# Patient Record
Sex: Male | Born: 1970 | Race: White | Hispanic: No | Smoking: Current every day smoker
Health system: Southern US, Community
[De-identification: ages and names within clinical notes are randomized; demographics above are authoritative.]

## PROBLEM LIST (undated history)

## (undated) DIAGNOSIS — G629 Polyneuropathy, unspecified: Secondary | ICD-10-CM

## (undated) DIAGNOSIS — J45909 Unspecified asthma, uncomplicated: Secondary | ICD-10-CM

## (undated) HISTORY — PX: APPENDECTOMY: SHX54

## (undated) HISTORY — PX: HERNIA REPAIR: SHX51

---

## 2019-01-03 DIAGNOSIS — M2041 Other hammer toe(s) (acquired), right foot: Secondary | ICD-10-CM | POA: Insufficient documentation

## 2020-07-16 ENCOUNTER — Emergency Department (HOSPITAL_COMMUNITY): Payer: Self-pay

## 2020-07-16 ENCOUNTER — Inpatient Hospital Stay (HOSPITAL_COMMUNITY)
Admission: EM | Admit: 2020-07-16 | Discharge: 2020-07-20 | DRG: 286 | Disposition: A | Discharge status: Home / Self Care | Payer: Self-pay | Attending: Family Medicine | Admitting: Family Medicine

## 2020-07-16 ENCOUNTER — Other Ambulatory Visit: Payer: Self-pay

## 2020-07-16 ENCOUNTER — Inpatient Hospital Stay (HOSPITAL_COMMUNITY): Payer: Self-pay

## 2020-07-16 ENCOUNTER — Encounter (HOSPITAL_COMMUNITY): Payer: Self-pay

## 2020-07-16 DIAGNOSIS — Z880 Allergy status to penicillin: Secondary | ICD-10-CM

## 2020-07-16 DIAGNOSIS — R609 Edema, unspecified: Secondary | ICD-10-CM

## 2020-07-16 DIAGNOSIS — I5082 Biventricular heart failure: Secondary | ICD-10-CM | POA: Diagnosis present

## 2020-07-16 DIAGNOSIS — L03116 Cellulitis of left lower limb: Secondary | ICD-10-CM | POA: Diagnosis present

## 2020-07-16 DIAGNOSIS — D649 Anemia, unspecified: Secondary | ICD-10-CM | POA: Diagnosis present

## 2020-07-16 DIAGNOSIS — I5041 Acute combined systolic (congestive) and diastolic (congestive) heart failure: Secondary | ICD-10-CM | POA: Diagnosis present

## 2020-07-16 DIAGNOSIS — I472 Ventricular tachycardia: Secondary | ICD-10-CM | POA: Diagnosis present

## 2020-07-16 DIAGNOSIS — I272 Pulmonary hypertension, unspecified: Secondary | ICD-10-CM | POA: Diagnosis present

## 2020-07-16 DIAGNOSIS — I420 Dilated cardiomyopathy: Secondary | ICD-10-CM | POA: Diagnosis present

## 2020-07-16 DIAGNOSIS — Z597 Insufficient social insurance and welfare support: Secondary | ICD-10-CM

## 2020-07-16 DIAGNOSIS — I5021 Acute systolic (congestive) heart failure: Secondary | ICD-10-CM

## 2020-07-16 DIAGNOSIS — D869 Sarcoidosis, unspecified: Secondary | ICD-10-CM | POA: Diagnosis present

## 2020-07-16 DIAGNOSIS — F17210 Nicotine dependence, cigarettes, uncomplicated: Secondary | ICD-10-CM | POA: Diagnosis present

## 2020-07-16 DIAGNOSIS — I081 Rheumatic disorders of both mitral and tricuspid valves: Secondary | ICD-10-CM | POA: Diagnosis present

## 2020-07-16 DIAGNOSIS — E854 Organ-limited amyloidosis: Secondary | ICD-10-CM | POA: Diagnosis present

## 2020-07-16 DIAGNOSIS — I8393 Asymptomatic varicose veins of bilateral lower extremities: Secondary | ICD-10-CM | POA: Diagnosis present

## 2020-07-16 DIAGNOSIS — I110 Hypertensive heart disease with heart failure: Principal | ICD-10-CM | POA: Diagnosis present

## 2020-07-16 DIAGNOSIS — I509 Heart failure, unspecified: Secondary | ICD-10-CM

## 2020-07-16 DIAGNOSIS — I2510 Atherosclerotic heart disease of native coronary artery without angina pectoris: Secondary | ICD-10-CM | POA: Diagnosis present

## 2020-07-16 DIAGNOSIS — Z20822 Contact with and (suspected) exposure to covid-19: Secondary | ICD-10-CM | POA: Diagnosis present

## 2020-07-16 DIAGNOSIS — F1510 Other stimulant abuse, uncomplicated: Secondary | ICD-10-CM | POA: Diagnosis present

## 2020-07-16 DIAGNOSIS — G629 Polyneuropathy, unspecified: Secondary | ICD-10-CM | POA: Diagnosis present

## 2020-07-16 DIAGNOSIS — E876 Hypokalemia: Secondary | ICD-10-CM | POA: Diagnosis present

## 2020-07-16 DIAGNOSIS — J45909 Unspecified asthma, uncomplicated: Secondary | ICD-10-CM | POA: Diagnosis present

## 2020-07-16 DIAGNOSIS — Z720 Tobacco use: Secondary | ICD-10-CM | POA: Diagnosis present

## 2020-07-16 HISTORY — DX: Unspecified asthma, uncomplicated: J45.909

## 2020-07-16 HISTORY — DX: Polyneuropathy, unspecified: G62.9

## 2020-07-16 LAB — COMPREHENSIVE METABOLIC PANEL
ALT: 42 U/L (ref 0–44)
AST: 39 U/L (ref 15–41)
Albumin: 3.2 g/dL — ABNORMAL LOW (ref 3.5–5.0)
Alkaline Phosphatase: 67 U/L (ref 38–126)
Anion gap: 8 (ref 5–15)
BUN: 24 mg/dL — ABNORMAL HIGH (ref 6–20)
CO2: 25 mmol/L (ref 22–32)
Calcium: 7.6 mg/dL — ABNORMAL LOW (ref 8.9–10.3)
Chloride: 104 mmol/L (ref 98–111)
Creatinine, Ser: 0.96 mg/dL (ref 0.61–1.24)
GFR, Estimated: >60 mL/min (ref >60)
Glucose, Bld: 93 mg/dL (ref 70–99)
Potassium: 3.3 mmol/L — ABNORMAL LOW (ref 3.5–5.1)
Sodium: 137 mmol/L (ref 135–145)
Total Bilirubin: 0.8 mg/dL (ref 0.3–1.2)
Total Protein: 5.6 g/dL — ABNORMAL LOW (ref 6.5–8.1)

## 2020-07-16 LAB — URINALYSIS, ROUTINE W REFLEX MICROSCOPIC
Bilirubin Urine: NEGATIVE
Glucose, UA: NEGATIVE mg/dL
Hgb urine dipstick: NEGATIVE
Ketones, ur: NEGATIVE mg/dL
Leukocytes,Ua: NEGATIVE
Nitrite: NEGATIVE
Protein, ur: NEGATIVE mg/dL
Specific Gravity, Urine: 1.015 (ref 1.005–1.030)
pH: 5 (ref 5.0–8.0)

## 2020-07-16 LAB — RAPID URINE DRUG SCREEN, HOSP PERFORMED
Amphetamines: POSITIVE — AB
Barbiturates: NOT DETECTED
Benzodiazepines: NOT DETECTED
Cocaine: NOT DETECTED
Opiates: NOT DETECTED
Tetrahydrocannabinol: NOT DETECTED

## 2020-07-16 LAB — CBC
HCT: 36.8 % — ABNORMAL LOW (ref 39.0–52.0)
HCT: 34.5 % — ABNORMAL LOW (ref 39.0–52.0)
Hemoglobin: 12.1 g/dL — ABNORMAL LOW (ref 13.0–17.0)
Hemoglobin: 11.1 g/dL — ABNORMAL LOW (ref 13.0–17.0)
MCH: 31.8 pg (ref 26.0–34.0)
MCH: 31.1 pg (ref 26.0–34.0)
MCHC: 32.9 g/dL (ref 30.0–36.0)
MCHC: 32.2 g/dL (ref 30.0–36.0)
MCV: 96.6 fL (ref 80.0–100.0)
MCV: 96.6 fL (ref 80.0–100.0)
Platelets: 333 10*3/uL (ref 150–400)
Platelets: 286 10*3/uL (ref 150–400)
RBC: 3.81 MIL/uL — ABNORMAL LOW (ref 4.22–5.81)
RBC: 3.57 MIL/uL — ABNORMAL LOW (ref 4.22–5.81)
RDW: 14.6 % (ref 11.5–15.5)
RDW: 14.4 % (ref 11.5–15.5)
WBC: 13.3 10*3/uL — ABNORMAL HIGH (ref 4.0–10.5)
WBC: 10.6 10*3/uL — ABNORMAL HIGH (ref 4.0–10.5)
nRBC: 0 % (ref 0.0–0.2)
nRBC: 0 % (ref 0.0–0.2)

## 2020-07-16 LAB — CREATININE, SERUM
Creatinine, Ser: 1.1 mg/dL (ref 0.61–1.24)
GFR, Estimated: >60 mL/min (ref >60)

## 2020-07-16 LAB — HIV ANTIBODY (ROUTINE TESTING W REFLEX): HIV Screen 4th Generation wRfx: NONREACTIVE

## 2020-07-16 LAB — ECHOCARDIOGRAM COMPLETE
Area-P 1/2: 5.73 cm2
Calc EF: 9.8 %
Height: 70 in
S' Lateral: 6.1 cm
Single Plane A2C EF: 11.6 %
Single Plane A4C EF: 4.7 %
Weight: 3104 oz

## 2020-07-16 LAB — BRAIN NATRIURETIC PEPTIDE: B Natriuretic Peptide: 564 pg/mL — ABNORMAL HIGH (ref 0.0–100.0)

## 2020-07-16 LAB — TROPONIN I (HIGH SENSITIVITY)
Troponin I (High Sensitivity): 36 ng/L — ABNORMAL HIGH (ref <18)
Troponin I (High Sensitivity): 28 ng/L — ABNORMAL HIGH (ref <18)

## 2020-07-16 LAB — RESP PANEL BY RT-PCR (FLU A&B, COVID) ARPGX2
Influenza A by PCR: NEGATIVE
Influenza B by PCR: NEGATIVE
SARS Coronavirus 2 by RT PCR: NEGATIVE

## 2020-07-16 LAB — D-DIMER, QUANTITATIVE: D-Dimer, Quant: 1.25 ug/mL-FEU — ABNORMAL HIGH (ref 0.00–0.50)

## 2020-07-16 MED ORDER — SODIUM CHLORIDE 0.9% FLUSH
3.0000 mL | Freq: Once | INTRAVENOUS | Status: AC
  Administered 2020-07-16: 3 mL via INTRAVENOUS

## 2020-07-16 MED ORDER — SACUBITRIL-VALSARTAN 24-26 MG PO TABS
1.0000 | ORAL_TABLET | Freq: Two times a day (BID) | ORAL | Status: DC
  Administered 2020-07-16 – 2020-07-20 (×8): 1 via ORAL
  Filled 2020-07-16 (×9): qty 1

## 2020-07-16 MED ORDER — IOHEXOL 350 MG/ML SOLN
100.0000 mL | Freq: Once | INTRAVENOUS | Status: AC | PRN
  Administered 2020-07-16: 75 mL via INTRAVENOUS

## 2020-07-16 MED ORDER — ASPIRIN 81 MG PO CHEW: 81.0000 mg | CHEWABLE_TABLET | ORAL | Status: AC

## 2020-07-16 MED ORDER — ENOXAPARIN SODIUM 40 MG/0.4ML IJ SOSY
40.0000 mg | PREFILLED_SYRINGE | INTRAMUSCULAR | Status: DC
  Administered 2020-07-16 – 2020-07-18 (×3): 40 mg via SUBCUTANEOUS
  Filled 2020-07-16 (×3): qty 0.4

## 2020-07-16 MED ORDER — ALBUTEROL SULFATE (2.5 MG/3ML) 0.083% IN NEBU
5.0000 mg | INHALATION_SOLUTION | Freq: Once | RESPIRATORY_TRACT | Status: AC
  Administered 2020-07-16: 5 mg via RESPIRATORY_TRACT
  Filled 2020-07-16: qty 6

## 2020-07-16 MED ORDER — PERFLUTREN LIPID MICROSPHERE
1.0000 mL | INTRAVENOUS | Status: AC | PRN
  Administered 2020-07-16: 2 mL via INTRAVENOUS
  Filled 2020-07-16: qty 10

## 2020-07-16 MED ORDER — ACETAMINOPHEN 325 MG PO TABS
650.0000 mg | ORAL_TABLET | Freq: Four times a day (QID) | ORAL | Status: DC | PRN
  Administered 2020-07-16: 650 mg via ORAL
  Filled 2020-07-16: qty 2

## 2020-07-16 MED ORDER — SODIUM CHLORIDE 0.9% FLUSH
3.0000 mL | Freq: Two times a day (BID) | INTRAVENOUS | Status: DC
  Administered 2020-07-16 – 2020-07-19 (×6): 3 mL via INTRAVENOUS

## 2020-07-16 MED ORDER — DOXYCYCLINE HYCLATE 100 MG PO TABS
100.0000 mg | ORAL_TABLET | Freq: Two times a day (BID) | ORAL | Status: DC
  Administered 2020-07-16 – 2020-07-20 (×9): 100 mg via ORAL
  Filled 2020-07-16 (×9): qty 1

## 2020-07-16 MED ORDER — SODIUM CHLORIDE 0.9 % IV SOLN: 250.0000 mL | INTRAVENOUS | Status: DC | PRN

## 2020-07-16 MED ORDER — OXYCODONE-ACETAMINOPHEN 5-325 MG PO TABS
1.0000 | ORAL_TABLET | Freq: Once | ORAL | Status: AC
  Administered 2020-07-16: 1 via ORAL
  Filled 2020-07-16: qty 1

## 2020-07-16 MED ORDER — POTASSIUM CHLORIDE CRYS ER 20 MEQ PO TBCR
20.0000 meq | EXTENDED_RELEASE_TABLET | Freq: Every day | ORAL | Status: DC
  Administered 2020-07-16: 20 meq via ORAL
  Filled 2020-07-16: qty 1

## 2020-07-16 MED ORDER — SODIUM CHLORIDE 0.9 % IV SOLN: INTRAVENOUS | Status: DC

## 2020-07-16 MED ORDER — FUROSEMIDE 10 MG/ML IJ SOLN
40.0000 mg | Freq: Two times a day (BID) | INTRAMUSCULAR | Status: DC
  Administered 2020-07-16 – 2020-07-17 (×4): 40 mg via INTRAVENOUS
  Filled 2020-07-16 (×4): qty 4

## 2020-07-16 MED ORDER — SODIUM CHLORIDE 0.9% FLUSH: 3.0000 mL | INTRAVENOUS | Status: DC | PRN

## 2020-07-16 MED ORDER — FUROSEMIDE 10 MG/ML IJ SOLN
40.0000 mg | INTRAMUSCULAR | Status: AC
  Administered 2020-07-16: 40 mg via INTRAVENOUS
  Filled 2020-07-16: qty 4

## 2020-07-16 MED ORDER — CLINDAMYCIN PHOSPHATE 600 MG/50ML IV SOLN: 600.0000 mg | Freq: Once | INTRAVENOUS | Status: DC

## 2020-07-16 MED ORDER — METOPROLOL SUCCINATE ER 25 MG PO TB24
25.0000 mg | ORAL_TABLET | Freq: Every day | ORAL | Status: DC
  Administered 2020-07-16: 25 mg via ORAL
  Filled 2020-07-16: qty 1

## 2020-07-16 MED ORDER — POTASSIUM CHLORIDE CRYS ER 20 MEQ PO TBCR
40.0000 meq | EXTENDED_RELEASE_TABLET | Freq: Once | ORAL | Status: AC
  Administered 2020-07-16: 40 meq via ORAL
  Filled 2020-07-16: qty 2

## 2020-07-16 NOTE — ED Triage Notes (Signed)
Patient said his left leg started swelling and then it moved to his right leg. He said his scrotum started swelling too. He feels short of breath on exertion for the past week and it keeps getting worse. Patient has history of asthma, but no cardiac history. Patient smokes 5 cigarettes a day.

## 2020-07-16 NOTE — Progress Notes (Signed)
BLE venous duplex has been completed.  Results can be found under chart review under CV PROC. 07/16/2020 12:35 PM Markela Wee RVT, RDMS

## 2020-07-16 NOTE — Plan of Care (Signed)
Right and left cardiac cath scheduled on Monday 07/19/20, staff RN may release orders accordingly, NPO after midnight before 07/19/20.

## 2020-07-16 NOTE — ED Provider Notes (Signed)
[facility]-EMERGENCY DEPT Provider Note   CSN: [id number] Arrival date & time: 07/16/20  0155     History Chief Complaint  Patient presents with   Leg Swelling   Shortness of Breath    Caleb Landry is a 50 y.o. male.  The history is provided by the patient and medical records.  Shortness of Breath Associated symptoms: wheezing    50 y.o. M with hx of asthma and neuropathy, presenting to the ED with LE edema and SOB.  States swelling started 3 days ago, initially just in the feet and ankles, now progressed all the way up to the legs and into the scrotum.  States he has never had any issues like this before.  States he is always SOB due to his asthma, does seem worse here recently.  Denies chest pain.  No fever/chills.  No sick contacts.  Chronic wheeze from his asthma, has not had any medications in quite some time as he does not have insurance and is waiting for disability resources.  Denies any known cardiac history.  He does smoke daily.  Past Medical History:  Diagnosis Date   Asthma    Neuropathy     There are no problems to display for this patient.   Past Surgical History:  Procedure Laterality Date   APPENDECTOMY     HERNIA REPAIR         History reviewed. No pertinent family history.  Social History   Tobacco Use   Smoking status: Every Day    Packs/day: 0.50    Years: 30.00    Pack years: 15.00    Types: Cigarettes  Substance Use Topics   Alcohol use: Not Currently   Drug use: Not Currently    Home Medications Prior to Admission medications   Not on File    Allergies    Penicillins  Review of Systems   Review of Systems  Respiratory:  Positive for shortness of breath and wheezing.   Cardiovascular:  Positive for leg swelling.  All other systems reviewed and are negative.  Physical Exam Updated Vital Signs BP (!) 126/97 (BP Location: Right Arm)   Pulse (!) 120   Temp 97.8 F (36.6 C) (Oral)   Resp 20   Ht 5\' 10"   (1.778 m)   Wt 88 kg   SpO2 95%   BMI 27.84 kg/m   Physical Exam Vitals and nursing note reviewed.  Constitutional:      Appearance: He is well-developed.  HENT:     Head: Normocephalic and atraumatic.  Eyes:     Conjunctiva/sclera: Conjunctivae normal.     Pupils: Pupils are equal, round, and reactive to light.  Cardiovascular:     Rate and Rhythm: Normal rate and regular rhythm.     Heart sounds: Normal heart sounds.  Pulmonary:     Effort: Pulmonary effort is normal.     Breath sounds: Wheezing present.  Abdominal:     General: Bowel sounds are normal.     Palpations: Abdomen is soft.  Musculoskeletal:        General: Normal range of motion.     Cervical back: Normal range of motion.     Comments: 2+ pitting edema BLE up to level of groin, grossly symmetric Chronic changes of hammertoes bilaterally  Skin:    General: Skin is warm and dry.  Neurological:     Mental Status: He is alert and oriented to person, place, and time.    ED Results /  Procedures / Treatments   Labs (all labs ordered are listed, but only abnormal results are displayed) Labs Reviewed  CBC - Abnormal; Notable for the following components:      Result Value   WBC 13.3 (*)    RBC 3.81 (*)    Hemoglobin 12.1 (*)    HCT 36.8 (*)    All other components within normal limits  BRAIN NATRIURETIC PEPTIDE - Abnormal; Notable for the following components:   B Natriuretic Peptide 564.0 (*)    All other components within normal limits  COMPREHENSIVE METABOLIC PANEL - Abnormal; Notable for the following components:   Potassium 3.3 (*)    BUN 24 (*)    Calcium 7.6 (*)    Total Protein 5.6 (*)    Albumin 3.2 (*)    All other components within normal limits  RAPID URINE DRUG SCREEN, HOSP PERFORMED - Abnormal; Notable for the following components:   Amphetamines POSITIVE (*)    All other components within normal limits  TROPONIN I (HIGH SENSITIVITY) - Abnormal; Notable for the following components:    Troponin I (High Sensitivity) 36 (*)    All other components within normal limits  TROPONIN I (HIGH SENSITIVITY) - Abnormal; Notable for the following components:   Troponin I (High Sensitivity) 28 (*)    All other components within normal limits  RESP PANEL BY RT-PCR (FLU A&B, COVID) [id number]  URINALYSIS, ROUTINE W REFLEX MICROSCOPIC    EKG EKG Interpretation  Date/Time:  Friday July 16 2020 03:41:12 EDT Ventricular Rate:  112 PR Interval:  168 QRS Duration: 95 QT Interval:  343 QTC Calculation: 469 R Axis:   80 Text Interpretation: Sinus tachycardia LVH with secondary repolarization abnormality No previous ECGs available Confirmed by Glynn Octave [phone]) on 07/16/2020 3:46:36 AM  Radiology DG Chest 2 View  Result Date: 07/16/2020 CLINICAL DATA:  50 year old male with shortness of breath. EXAM: CHEST - 2 VIEW COMPARISON:  None FINDINGS: Mild diffuse chronic interstitial coarsening and emphysema. No focal consolidation, pleural effusion, or pneumothorax. Mild cardiomegaly. No acute osseous pathology. IMPRESSION: No acute cardiopulmonary process. Electronically Signed   By: Elgie Collard M.D.   On: 07/16/2020 03:06    Procedures Procedures   Medications Ordered in ED Medications  sodium chloride flush (NS) 0.9 % injection 3 mL (3 mLs Intravenous Given 07/16/20 0303)  albuterol (PROVENTIL) (2.5 MG/3ML) 0.083% nebulizer solution 5 mg (5 mg Nebulization Given 07/16/20 0308)  furosemide (LASIX) injection 40 mg (40 mg Intravenous Given 07/16/20 0420)  oxyCODONE-acetaminophen (PERCOCET/ROXICET) 5-325 MG per tablet 1 tablet (1 tablet Oral Given 07/16/20 0420)    ED Course  I have reviewed the triage vital signs and the nursing notes.  Pertinent labs & imaging results that were available during my care of the patient were reviewed by me and considered in my medical decision making (see chart for details).    MDM Rules/Calculators/A&P  50 year old male presenting to the ED with  lower extremity swelling.  Initially began in feet and ankles is now progressed up to the level of his scrotum.  Also reports of increasing shortness of breath.  Has history of asthma and is a daily smoker but denies any known cardiac history.  He is afebrile and nontoxic in appearance here.  He does have 2+ pitting edema bilateral lower extremities, this is grossly symmetric.  Does have some diffuse wheezing but no overt rails.  Will obtain screening labs, chest x-ray, COVID screen.  Work-up is concerning for new onset CHF.  BNP 564.  Trop elevated but flat at 36 and 28.  Likely component of demand ischemia.  Does have some LVH on EKG but no acute ischemic changes.  UDS + for amphetamines.  Chemistry with mildly low protein but no proteinuria so lower suspicion for acute nephrotic cause.  He is given IV Lasix and has diuresed out about 1 L and reports he is feeling somewhat better.  He will require admission for ongoing work-up, likely echo in AM.  Discussed with Dr. Leafy Half-- will admit for ongoing care.  Final Clinical Impression(s) / ED Diagnoses Final diagnoses:  Acute congestive heart failure, unspecified heart failure type [facility])    Rx / DC Orders ED Discharge Orders     None        Garlon Hatchet, PA-C 07/16/20 0556    Glynn Octave, MD 07/16/20 [phone]

## 2020-07-16 NOTE — H&P (Signed)
History and Physical    Caleb Landry ZLD:[id number] DOB: 1970-11-07 DOA: 07/16/2020  PCP: Pcp, No  Patient coming from: Home.  Chief Complaint: Shortness of breath lower extremity swelling.  HPI: Caleb Landry is a 50 y.o. male with history of asthma, tobacco abuse presents to the ER because of worsening swelling of the lower extremities.  Patient states he noticed the swelling on the left lower extremity over the last 1 month and the right lower extremity started swelling over the last few days which has progressed very quick over the last few days involving his scrotum at this time.  Over the last few days he also has been having shortness of breath exertional and orthopnea.  Denies any chest pain productive cough fever or chills.  Patient states he used to be on albuterol for asthma which he is off at this time since he moved to [place] over a year ago from [place].  ED Course: In the ER on exam patient has significant swelling of the both lower extremity more on the left side than the right and also have some varicose veins.  Swelling extends up to the groin.  JVD is elevated.  Labs reviewed potassium of 3.3 BNP of 564 high sensitive troponins were 36 and 28 WBC count is 13.3 hemoglobin 12.1 chest x-ray unremarkable drug screen is positive for amphetamine though patient denies taking any drugs EKG shows sinus tachycardia with LVH.  Patient was given Lasix 40 mg IV and admitted for possible new onset CHF.  Review of Systems: As per HPI, rest all negative.   Past Medical History:  Diagnosis Date   Asthma    Neuropathy     Past Surgical History:  Procedure Laterality Date   APPENDECTOMY     HERNIA REPAIR       reports that he has been smoking cigarettes. He has a 15.00 pack-year smoking history. He has never used smokeless tobacco. He reports previous alcohol use. He reports previous drug use.  Allergies  Allergen Reactions   Penicillins Hives    Family History  Problem  Relation Age of Onset   Crohn's disease Mother     Prior to Admission medications   Not on File    Physical Exam: Constitutional: Moderately built and nourished. Vitals:   07/16/20 0430 07/16/20 0445 07/16/20 0500 07/16/20 0830  BP: 125/85 (!) 135/98 135/89 119/90  Pulse: (!) 108 (!) 109 (!) 107 (!) 107  Resp: 19 12 15 16   Temp:      TempSrc:      SpO2: 100% (!) 87% 100% 98%  Weight:      Height:       Eyes: Anicteric no pallor. ENMT: No discharge from the ears eyes nose and mouth. Neck: No mass felt.  No neck rigidity. Respiratory: No rhonchi or crepitations. Cardiovascular: S1-S2 heard. Abdomen: Soft nontender bowel sounds present. Musculoskeletal: Bilateral lower extremity swelling more on the left side than the right with some warmth of the left lower extremity. Skin: Mild erythema of the left lower extremity. Neurologic: Alert awake oriented to time place and person.  Moves all extremities. Psychiatric: Appears normal.  Normal affect.   Labs on Admission: I have personally reviewed following labs and imaging studies  CBC: Recent Labs  Lab 07/16/20 0213  WBC 13.3*  HGB 12.1*  HCT 36.8*  MCV 96.6  PLT 333   Basic Metabolic Panel: Recent Labs  Lab 07/16/20 0412  NA 137  K 3.3*  CL 104  CO2  25  GLUCOSE 93  BUN 24*  CREATININE 0.96  CALCIUM 7.6*   GFR: Estimated Creatinine Clearance: 104 mL/min (by C-G formula based on SCr of 0.96 mg/dL). Liver Function Tests: Recent Labs  Lab 07/16/20 0412  AST 39  ALT 42  ALKPHOS 67  BILITOT 0.8  PROT 5.6*  ALBUMIN 3.2*   No results for input(s): LIPASE, AMYLASE in the last 168 hours. No results for input(s): AMMONIA in the last 168 hours. Coagulation Profile: No results for input(s): INR, PROTIME in the last 168 hours. Cardiac Enzymes: No results for input(s): CKTOTAL, CKMB, CKMBINDEX, TROPONINI in the last 168 hours. BNP (last 3 results) No results for input(s): PROBNP in the last 8760  hours. HbA1C: No results for input(s): HGBA1C in the last 72 hours. CBG: No results for input(s): GLUCAP in the last 168 hours. Lipid Profile: No results for input(s): CHOL, HDL, LDLCALC, TRIG, CHOLHDL, LDLDIRECT in the last 72 hours. Thyroid Function Tests: No results for input(s): TSH, T4TOTAL, FREET4, T3FREE, THYROIDAB in the last 72 hours. Anemia Panel: No results for input(s): VITAMINB12, FOLATE, FERRITIN, TIBC, IRON, RETICCTPCT in the last 72 hours. Urine analysis:    Component Value Date/Time   COLORURINE YELLOW 07/16/2020 0450   APPEARANCEUR CLEAR 07/16/2020 0450   LABSPEC 1.015 07/16/2020 0450   PHURINE 5.0 07/16/2020 0450   GLUCOSEU NEGATIVE 07/16/2020 0450   HGBUR NEGATIVE 07/16/2020 0450   BILIRUBINUR NEGATIVE 07/16/2020 0450   KETONESUR NEGATIVE 07/16/2020 0450   PROTEINUR NEGATIVE 07/16/2020 0450   NITRITE NEGATIVE 07/16/2020 0450   LEUKOCYTESUR NEGATIVE 07/16/2020 0450   Sepsis Labs: @LABRCNTIP (procalcitonin:4,lacticidven:4) ) Recent Results (from the past 240 hour(s))  Resp Panel by RT-PCR (Flu A&B, Covid) Nasopharyngeal Swab     Status: None   Collection Time: 07/16/20  2:23 AM   Specimen: Nasopharyngeal Swab; Nasopharyngeal(NP) swabs in vial transport medium  Result Value Ref Range Status   SARS Coronavirus 2 by RT PCR NEGATIVE NEGATIVE Final    Comment: (NOTE) SARS-CoV-2 target nucleic acids are NOT DETECTED.  The SARS-CoV-2 RNA is generally detectable in upper respiratory specimens during the acute phase of infection. The lowest concentration of SARS-CoV-2 viral copies this assay can detect is 138 copies/mL. A negative result does not preclude SARS-Cov-2 infection and should not be used as the sole basis for treatment or other patient management decisions. A negative result may occur with  improper specimen collection/handling, submission of specimen other than nasopharyngeal swab, presence of viral mutation(s) within the areas targeted by this assay,  and inadequate number of viral copies(<138 copies/mL). A negative result must be combined with clinical observations, patient history, and epidemiological information. The expected result is Negative.  Fact Sheet for Patients:  07/18/20  Fact Sheet for Healthcare Providers:  BloggerCourse.com  This test is no t yet approved or cleared by the [place] FDA and  has been authorized for detection and/or diagnosis of SARS-CoV-2 by FDA under an Emergency Use Authorization (EUA). This EUA will remain  in effect (meaning this test can be used) for the duration of the COVID-19 declaration under Section 564(b)(1) of the Act, 21 U.S.C.section 360bbb-3(b)(1), unless the authorization is terminated  or revoked sooner.       Influenza A by PCR NEGATIVE NEGATIVE Final   Influenza B by PCR NEGATIVE NEGATIVE Final    Comment: (NOTE) The Xpert Xpress SARS-CoV-2/FLU/RSV plus assay is intended as an aid in the diagnosis of influenza from Nasopharyngeal swab specimens and should not be used as a sole basis  for treatment. Nasal washings and aspirates are unacceptable for Xpert Xpress SARS-CoV-2/FLU/RSV testing.  Fact Sheet for Patients: BloggerCourse.com  Fact Sheet for Healthcare Providers: SeriousBroker.it  This test is not yet approved or cleared by the [place] FDA and has been authorized for detection and/or diagnosis of SARS-CoV-2 by FDA under an Emergency Use Authorization (EUA). This EUA will remain in effect (meaning this test can be used) for the duration of the COVID-19 declaration under Section 564(b)(1) of the Act, 21 U.S.C. section 360bbb-3(b)(1), unless the authorization is terminated or revoked.  Performed at [facility], [address] [place], [place] [address]      Radiological Exams on Admission: DG Chest 2 View  Result Date:  07/16/2020 CLINICAL DATA:  50 year old male with shortness of breath. EXAM: CHEST - 2 VIEW COMPARISON:  None FINDINGS: Mild diffuse chronic interstitial coarsening and emphysema. No focal consolidation, pleural effusion, or pneumothorax. Mild cardiomegaly. No acute osseous pathology. IMPRESSION: No acute cardiopulmonary process. Electronically Signed   By: Elgie Collard M.D.   On: 07/16/2020 03:06    EKG: Independently reviewed.  Sinus tachycardia with LVH.  Assessment/Plan Principal Problem:   Acute congestive heart failure (HCC) Active Problems:   Tobacco abuse   Asthma   Acute CHF (congestive heart failure) (HCC)    Shortness of breath likely from new onset acute CHF with unknown EF -given the markedly significant lower extreme edema with exertional symptoms and orthopnea symptoms are more consistent with CHF.  Has been placed on Lasix 40 mg IV every 12 with potassium replacement we will check 2D echo closely follow intake and output and metabolic panel.  However since patient does have significant swelling more on the left than the right we will check Dopplers to rule out DVT since there is also warmth on the left side.  If D-dimer is positive we will be ordering a CT angiogram of the chest to rule out PE. Left lower extremity swelling more than the right with some warmth at Dopplers have been ordered to rule out DVT.  He has some erythema on the leg for which I have ordered doxycycline for now to make sure there is no cellulitis developing. Tobacco abuse advised about quitting. Normocytic normochromic anemia will anemia panel with next blood draw stool for occult blood follow CBC. History of asthma presently not wheezing.  Since patient has signs and symptoms of possible new onset CHF will need close monitoring and further work-up and inpatient status.   DVT prophylaxis: Lovenox. Code Status: Full code. Family Communication: Discussed with patient. Disposition Plan: Home. Consults  called: We will consult cardiology. Admission status: Inpatient.   Eduard Clos MD [facility] Pager [phone].  If 7PM-7AM, please contact night-coverage [email or web address] Password [email or web address]  07/16/2020, 9:02 AM

## 2020-07-16 NOTE — ED Notes (Signed)
Hospitalist at bedside 

## 2020-07-16 NOTE — Plan of Care (Signed)

## 2020-07-16 NOTE — ED Notes (Signed)
[facility] at bedside 

## 2020-07-16 NOTE — Progress Notes (Signed)
  Echocardiogram 2D Echocardiogram has been performed. Cardiology notified.   Janalyn Harder 07/16/2020, 10:34 AM

## 2020-07-16 NOTE — Consult Note (Addendum)
Cardiology Consultation:   Patient ID: Caleb Landry MRN: [id number]; DOB: [birth year]  Admit date: 07/16/2020 Date of Consult: 07/16/2020  PCP:  Oneita Hurt No   [facility] Providers Cardiologist: New to Dr. Antoine Poche   Patient Profile:   Caleb Landry is a 50 y.o. male with PMH of asthma, tobacco abuse, sarcoidosis,  who is being seen 07/16/2020 for the evaluation of CHF at the request of Dr. Toniann Fail.  History of Present Illness:   Mr. Monteverde has no known cardiac disease in the past.   Patient presented to the ER on 07/16/2020 with complaining of lower extremity and shortness of breath.  He noticed swelling started on his feet and ankles, gradually progressed to the level of his scrotum.  He reports shortness of breath with exertional activity.  He endorses orthopnea.  Symptoms started 1 month ago and is much worsened over the past few days.  Therefore he came to the ER for evaluation. He also c/o of LLE erythema and tenderness. He has urinated a lot after getting Lasix and is feeling better with SOB. He denied any chest pain or pressure, dizziness, syncope, heart palpitation.   He is a daily tobacco smoker with average 5 cigarettes daily, he had hx of asthma, but due to no insurance, he is not using any bronchodilators. He states he purchases OTC inhalers. He states he was diagnosed with sarcoidosis with biopsy when he was 21 years ago, not following up on this or getting any treatment due to no insurance. He drinks ETOH occasionally with 1 -2 beers. He denied any illicit drug use. He is unemployed, lost his wife 2 years ago, is trying to get disability. He does not recall significant family medical history, recalls his mother was gaining a lot of weight.   Diagnostic work-up here revealed hypokalemia 3.3, elevated BUN 24, albumin 3.2 on CMP.  High sensitive troponin 36>28.  CBC revealed normocytic anemia with hemoglobin 11.1, otherwise unremarkable.  BNP 564. D-dimer elevated 1.25.  HIV negative.   Urinalysis unremarkable.  UDS positive for amphetamine.  Chest x-ray did not reveal acute finding.  CTA of chest showed no acute pulmonary emboli, biapical and bibasilar scarring, nodular density in the upper lobes/apices bilaterally. Doppler of BLE negative for DVTs. EKG revealed sinus tachycardia 112 bpm, LVH, TWI noted of inferolateral leads, no prior EKG to compare. He is tachycardiac with HR 100-120s, otherwise HD stable at ED. He was admitted to hospital medicine, started on IV Lasix  BID.   Echo was completed today, showed EF <20%, LV global hypokinesis, LV moderately dilated, mild concentric LVH, grade III DD (restrictive), elevated LA pressure, no LV thrombus, RV systolic moderately reduced, RV moderately reduced, moderate elevated PASP with RVSP 54.7 mmHg, severely dilated LA, moderately dilated RA, small pericardial effusion, mild to moderate MR, mild to moderate TR, mild dilatation of the ascending aorta measuring 41 mm.   Cardiology consult is requested for further input.     Past Medical History:  Diagnosis Date   Asthma    Neuropathy     Past Surgical History:  Procedure Laterality Date   APPENDECTOMY     HERNIA REPAIR       Home Medications:  Prior to Admission medications   Not on File    Inpatient Medications: Scheduled Meds:  doxycycline  100 mg Oral Q12H   enoxaparin (LOVENOX) injection  40 mg Subcutaneous Q24H   furosemide  40 mg Intravenous Q12H   metoprolol succinate  25 mg Oral Daily  potassium chloride  20 mEq Oral Daily   sacubitril-valsartan  1 tablet Oral BID   Continuous Infusions:  PRN Meds: REM  Allergies:    Allergies  Allergen Reactions   Penicillins Hives    Social History:   Social History   Socioeconomic History   Marital status: Widowed    Spouse name: Not on file   Number of children: Not on file   Years of education: Not on file   Highest education level: Not on file  Occupational History   Not on file  Tobacco Use    Smoking status: Every Day    Packs/day: 0.50    Years: 30.00    Pack years: 15.00    Types: Cigarettes   Smokeless tobacco: Never  Substance and Sexual Activity   Alcohol use: Not Currently   Drug use: Not Currently   Sexual activity: Not on file  Other Topics Concern   Not on file  Social History Narrative   Not on file   Social Determinants of Health   Financial Resource Strain: Not on file  Food Insecurity: Not on file  Transportation Needs: Not on file  Physical Activity: Not on file  Stress: Not on file  Social Connections: Not on file  Intimate Partner Violence: Not on file    Family History:    Family History  Problem Relation Age of Onset   Crohn's disease Mother      ROS:  Vitals:  Vitals:   07/16/20 1216 07/16/20 1300  BP: 103/72 117/72  Pulse: (!) 117 (!) 121  Resp: 18 18  Temp:    SpO2: 100% 98%   Constitutional: Denied fever, chills, malaise, night sweats Eyes: Denied vision change or loss Ears/Nose/Mouth/Throat: Denied ear ache, sore throat, coughing, sinus pain Cardiovascular:see HPI Respiratory: see HPI  Gastrointestinal: Denied nausea, vomiting, abdominal pain, diarrhea Genital/Urinary: Denied dysuria, hematuria, urinary frequency/urgency Musculoskeletal: Denied muscle ache, joint pain, weakness Skin: Denied rash, wound Neuro: Denied headache, dizziness, syncope Psych: Denied history of depression/anxiety  Endocrine: Denied history of diabetes    Physical Exam/Data:   Vitals:   07/16/20 1045 07/16/20 1100 07/16/20 1216 07/16/20 1300  BP: 117/79 108/75 103/72 117/72  Pulse: (!) 106 (!) 105 (!) 117 (!) 121  Resp:   18 18  Temp:      TempSrc:      SpO2: 100% 96% 100% 98%  Weight:      Height:        Intake/Output Summary (Last 24 hours) at 07/16/2020 1539 Last data filed at 07/16/2020 1500 Gross per 24 hour  Intake 240 ml  Output 4100 ml  Net -3860 ml   Last 3 Weights 07/16/2020  Weight (lbs) 194 lb  Weight (kg) 87.998 kg      Body mass index is 27.84 kg/m.   Vitals:  Vitals:   07/16/20 1216 07/16/20 1300  BP: 103/72 117/72  Pulse: (!) 117 (!) 121  Resp: 18 18  Temp:    SpO2: 100% 98%   General Appearance: In no apparent distress, sitting in bed, cachexia  HEENT: Normocephalic, atraumatic.  Neck: Supple, trachea midline, JVD 8-9 CM Cardiovascular: Regular rate and rhythm, normal S1-S2,  no murmur/rub/gallop Respiratory: Resting breathing unlabored, lungs sounds with diffuse wheezing bilaterally, no use of accessory muscles. On room air.  Speaks full sentence  Gastrointestinal: Bowel sounds positive, abdomen soft, non-tender, non-distended.  Extremities: BLE 2+ pitting edema from feet to scrotum area Genitourinary: Urine clear yellow  Musculoskeletal: Normal muscle bulk and  tone, muscle strength 5/5 throughout Skin: Intact, warm, dry. LLE with erythema/warmth/tenderness without open wound or ulcer or drainage  Neurologic: Alert, oriented to person, place and time. Fluent speech,  no cognitive deficit, no gross focal neuro deficit Psychiatric: Normal affect. Mood is appropriate.    EKG:  The EKG was personally reviewed and demonstrates:  Sinus tachycardia , LVH, TWI of inferolateral leads, no previous EKG to compare  Telemetry:  Telemetry was personally reviewed and demonstrates:  Not connected at this time   Relevant CV Studies:  Echo from 07/16/20:   1. Left ventricular ejection fraction, by estimation, is <20%. The left  ventricle has severely decreased function. The left ventricle demonstrates  global hypokinesis. The left ventricular internal cavity size was  moderately dilated. There is mild  concentric left ventricular hypertrophy. Left ventricular diastolic  parameters are consistent with Grade III diastolic dysfunction  (restrictive). Elevated left atrial pressure. Non Compaction Fraction <  2.3 my [name]. No LV thrombus.   2. Right ventricular systolic function is  moderately reduced. The right  ventricular size is moderately enlarged. There is moderately elevated  pulmonary artery systolic pressure. The estimated right ventricular  systolic pressure is 54.7 mmHg.   3. Left atrial size was severely dilated.   4. Right atrial size was moderately dilated.   5. A small pericardial effusion is present.   6. The mitral valve is grossly normal. Mild to moderate mitral valve  regurgitation- mechanism appears to be ventricular functional. No evidence  of mitral stenosis.   7. Tricuspid valve regurgitation is mild to moderate- mechanisms appears  to be ventricular functional.   8. The aortic valve was not well visualized. Aortic valve regurgitation  is not visualized. No aortic stenosis is present.   9. Aortic dilatation noted. There is mild dilatation of the ascending  aorta, measuring 41 mm.  10. The inferior vena cava is dilated in size with <50% respiratory  variability, suggesting right atrial pressure of 15 mmHg.   Comparison(s): No prior Echocardiogram.   Laboratory Data:  High Sensitivity Troponin:   Recent Labs  Lab 07/16/20 0222 07/16/20 0412  TROPONINIHS 36* 28*     Chemistry Recent Labs  Lab 07/16/20 0412 07/16/20 0926  NA 137  --   K 3.3*  --   CL 104  --   CO2 25  --   GLUCOSE 93  --   BUN 24*  --   CREATININE 0.96 1.10  CALCIUM 7.6*  --   GFRNONAA >60 >60  ANIONGAP 8  --     Recent Labs  Lab 07/16/20 0412  PROT 5.6*  ALBUMIN 3.2*  AST 39  ALT 42  ALKPHOS 67  BILITOT 0.8   Hematology Recent Labs  Lab 07/16/20 0213 07/16/20 0926  WBC 13.3* 10.6*  RBC 3.81* 3.57*  HGB 12.1* 11.1*  HCT 36.8* 34.5*  MCV 96.6 96.6  MCH 31.8 31.1  MCHC 32.9 32.2  RDW 14.6 14.4  PLT 333 286   BNP Recent Labs  Lab 07/16/20 0223  BNP 564.0*    DDimer  Recent Labs  Lab 07/16/20 0813  DDIMER 1.25*     Radiology/Studies:  DG Chest 2 View  Result Date: 07/16/2020 CLINICAL DATA:  50 year old male with shortness of  breath. EXAM: CHEST - 2 VIEW COMPARISON:  None FINDINGS: Mild diffuse chronic interstitial coarsening and emphysema. No focal consolidation, pleural effusion, or pneumothorax. Mild cardiomegaly. No acute osseous pathology. IMPRESSION: No acute cardiopulmonary process. Electronically Signed  By: Elgie Collard M.D.   On: 07/16/2020 03:06   CT Angio Chest Pulmonary Embolism (PE) W or WO Contrast  Result Date: 07/16/2020 CLINICAL DATA:  Leg swelling.  Shortness of breath. EXAM: CT ANGIOGRAPHY CHEST WITH CONTRAST TECHNIQUE: Multidetector CT imaging of the chest was performed using the standard protocol during bolus administration of intravenous contrast. Multiplanar CT image reconstructions and MIPs were obtained to evaluate the vascular anatomy. CONTRAST:  75mL OMNIPAQUE IOHEXOL 350 MG/ML SOLN COMPARISON:  None. FINDINGS: Cardiovascular: No filling defects in the pulmonary arteries to suggest pulmonary emboli. Heart is mildly enlarged. Aorta normal caliber. Mediastinum/Nodes: No mediastinal, hilar, or axillary adenopathy. Trachea and esophagus are unremarkable. Thyroid unremarkable. Lungs/Pleura: Biapical pulmonary nodules measuring up to 10 mm in the left apex and 9 mm in the right apex. Areas of apical and bibasilar scarring. Trace right pleural effusion. Upper Abdomen: Imaging into the upper abdomen demonstrates no acute findings. Musculoskeletal: Chest wall soft tissues are unremarkable. No acute bony abnormality. Review of the MIP images confirms the above findings. IMPRESSION: No evidence of pulmonary embolus. Cardiomegaly. Biapical and bibasilar scarring. Nodular densities in the upper lobes/apices bilaterally warrant follow-up. Non-contrast chest CT at 3-6 months is recommended. If the nodules are stable at time of repeat CT, then future CT at 18-24 months (from today's scan) is considered optional for low-risk patients, but is recommended for high-risk patients. This recommendation follows the consensus  statement: Guidelines for Management of Incidental Pulmonary Nodules Detected on CT Images: From the Fleischner Society 2017; Radiology 2017; 284:228-243. Trace right pleural effusion. Electronically Signed   By: Charlett Nose M.D.   On: 07/16/2020 11:38   ECHOCARDIOGRAM COMPLETE  Result Date: 07/16/2020    ECHOCARDIOGRAM REPORT   Patient Name:   Sandra Hottenstein Date of Exam: 07/16/2020 Medical Rec #:  [id number]    Height:       70.0 in Accession #:    [id number]   Weight:       194.0 lb Date of Birth:  1970-09-21    BSA:          2.060 m Patient Age:    50 years     BP:           119/90 mmHg Patient Gender: M            HR:           105 bpm. Exam Location:  Inpatient Procedure: 2D Echo, 3D Echo, Cardiac Doppler and Color Doppler REPORT CONTAINS CRITICAL RESULT                                 MODIFIED REPORT:     This report was modified by Riley Lam MD on 07/16/2020 due to                                Additional images.  Indications:     I50.40* Unspecified combined systolic (congestive) and                  diastolic (congestive) heart failure  History:         Patient has no prior history of Echocardiogram examinations.                  CHF, Signs/Symptoms:Shortness of Breath and Dyspnea; Risk  Factors:Current Smoker.  Sonographer:     Sheralyn Boatmanina West RDCS Referring Phys:  [id number] Meryle ReadyARSHAD N [facility] LLCKAKRAKANDY Diagnosing Phys: Riley LamMahesh Chandrasekhar MD  Sonographer Comments: Suboptimal parasternal window and suboptimal subcostal window. Patient in high fowler's position due to dyspnea. IMPRESSIONS  1. Left ventricular ejection fraction, by estimation, is <20%. The left ventricle has severely decreased function. The left ventricle demonstrates global hypokinesis. The left ventricular internal cavity size was moderately dilated. There is mild concentric left ventricular hypertrophy. Left ventricular diastolic parameters are consistent with Grade III diastolic dysfunction (restrictive). Elevated left atrial  pressure. Non Compaction Fraction < 2.3 my [name]. No LV thrombus.  2. Right ventricular systolic function is moderately reduced. The right ventricular size is moderately enlarged. There is moderately elevated pulmonary artery systolic pressure. The estimated right ventricular systolic pressure is 54.7 mmHg.  3. Left atrial size was severely dilated.  4. Right atrial size was moderately dilated.  5. A small pericardial effusion is present.  6. The mitral valve is grossly normal. Mild to moderate mitral valve regurgitation- mechanism appears to be ventricular functional. No evidence of mitral stenosis.  7. Tricuspid valve regurgitation is mild to moderate- mechanisms appears to be ventricular functional.  8. The aortic valve was not well visualized. Aortic valve regurgitation is not visualized. No aortic stenosis is present.  9. Aortic dilatation noted. There is mild dilatation of the ascending aorta, measuring 41 mm. 10. The inferior vena cava is dilated in size with <50% respiratory variability, suggesting right atrial pressure of 15 mmHg. Comparison(s): No prior Echocardiogram. Conclusion(s)/Recommendation(s): New Biventricular dysfunction. Discussed with primary MD. Constrasted images do not meet criteria for LV non-compaction or show LV thrombus. CMR could be considered in the future if clinically indicated. FINDINGS  Left Ventricle: Left ventricular ejection fraction, by estimation, is <20%. The left ventricle has severely decreased function. The left ventricle demonstrates global hypokinesis. The left ventricular internal cavity size was moderately dilated. There is mild concentric left ventricular hypertrophy. Left ventricular diastolic parameters are consistent with Grade III diastolic dysfunction (restrictive). Elevated left atrial pressure. Right Ventricle: The right ventricular size is moderately enlarged. Right vetricular wall thickness was not well visualized. Right ventricular systolic  function is moderately reduced. There is moderately elevated pulmonary artery systolic pressure. The tricuspid regurgitant velocity is 3.15 m/s, and with an assumed right atrial pressure of 15 mmHg, the estimated right ventricular systolic pressure is 54.7 mmHg. Left Atrium: Left atrial size was severely dilated. Right Atrium: Right atrial size was moderately dilated. Pericardium: A small pericardial effusion is present. Mitral Valve: The mitral valve is grossly normal. Mild to moderate mitral valve regurgitation. No evidence of mitral valve stenosis. Tricuspid Valve: The tricuspid valve is grossly normal. Tricuspid valve regurgitation is mild to moderate. No evidence of tricuspid stenosis. Aortic Valve: The aortic valve was not well visualized. Aortic valve regurgitation is not visualized. No aortic stenosis is present. Pulmonic Valve: The pulmonic valve was not well visualized. Pulmonic valve regurgitation is not visualized. No evidence of pulmonic stenosis. Aorta: The aortic root is normal in size and structure and aortic dilatation noted. There is mild dilatation of the ascending aorta, measuring 41 mm. Venous: The inferior vena cava is dilated in size with less than 50% respiratory variability, suggesting right atrial pressure of 15 mmHg. IAS/Shunts: The atrial septum is grossly normal.  LEFT VENTRICLE PLAX 2D LVIDd:         6.60 cm      Diastology LVIDs:  6.10 cm      LV e' medial:    6.09 cm/s LV PW:         1.40 cm      LV E/e' medial:  21.3 LV IVS:        1.20 cm      LV e' lateral:   7.29 cm/s LVOT diam:     2.30 cm      LV E/e' lateral: 17.8 LV SV:         41 LV SV Index:   20 LVOT Area:     4.15 cm  LV Volumes (MOD) LV vol d, MOD A2C: 293.0 ml LV vol d, MOD A4C: 211.0 ml LV vol s, MOD A2C: 259.0 ml LV vol s, MOD A4C: 201.0 ml LV SV MOD A2C:     34.0 ml LV SV MOD A4C:     211.0 ml LV SV MOD BP:      25.1 ml RIGHT VENTRICLE RV S prime:     5.87 cm/s TAPSE (M-mode): 1.1 cm LEFT ATRIUM               Index       RIGHT ATRIUM           Index LA diam:        2.80 cm  1.36 cm/m  RA Area:     23.70 cm LA Vol (A2C):   92.9 ml  45.09 ml/m RA Volume:   89.80 ml  43.58 ml/m LA Vol (A4C):   117.0 ml 56.78 ml/m LA Biplane Vol: 107.0 ml 51.93 ml/m  AORTIC VALVE LVOT Vmax:   78.80 cm/s LVOT Vmean:  50.900 cm/s LVOT VTI:    0.099 m  AORTA Ao Root diam: 3.90 cm Ao Asc diam:  4.10 cm MITRAL VALVE                TRICUSPID VALVE MV Area (PHT): 5.73 cm     TR Peak grad:   39.7 mmHg MV Decel Time: 133 msec     TR Vmax:        315.00 cm/s MV E velocity: 129.50 cm/s                             SHUNTS                             Systemic VTI:  0.10 m                             Systemic Diam: 2.30 cm Riley Lam MD Electronically signed by Riley Lam MD Signature Date/Time: 07/16/2020/10:59:36 AM    Final (Updated)    VAS [place] LOWER EXTREMITY VENOUS (DVT)  Result Date: 07/16/2020  Lower Venous DVT Study Patient Name:  SUSANO CLECKLER  Date of Exam:   07/16/2020 Medical Rec #: [id number]     Accession #:    [id number] Date of Birth: 12-28-1970     Patient Gender: M Patient Age:   50Y Exam Location:  [facility] Procedure:      VAS [place] LOWER EXTREMITY VENOUS (DVT) Referring Phys: [id number] Eduard Clos --------------------------------------------------------------------------------  Indications: Edema.  Limitations: Poor ultrasound/tissue interface. Comparison Study: No previous exams Performing Technologist: Jody Hill RVT, RDMS  Examination Guidelines: A complete evaluation includes B-mode imaging, spectral Doppler, color Doppler, and power Doppler  as needed of all accessible portions of each vessel. Bilateral testing is considered an integral part of a complete examination. Limited examinations for reoccurring indications may be performed as noted. The reflux portion of the exam is performed with the patient in reverse Trendelenburg.   +---------+---------------+---------+-----------+----------+--------------+ RIGHT    CompressibilityPhasicitySpontaneityPropertiesThrombus Aging +---------+---------------+---------+-----------+----------+--------------+ CFV      Full           Yes      Yes                                 +---------+---------------+---------+-----------+----------+--------------+ SFJ      Full                                                        +---------+---------------+---------+-----------+----------+--------------+ FV Prox  Full           Yes      Yes                                 +---------+---------------+---------+-----------+----------+--------------+ FV Mid   Full           Yes      Yes                                 +---------+---------------+---------+-----------+----------+--------------+ FV DistalFull           Yes      Yes                                 +---------+---------------+---------+-----------+----------+--------------+ PFV      Full                                                        +---------+---------------+---------+-----------+----------+--------------+ POP      Full           Yes      Yes                                 +---------+---------------+---------+-----------+----------+--------------+ PTV      Full                                                        +---------+---------------+---------+-----------+----------+--------------+ PERO     Full                                                        +---------+---------------+---------+-----------+----------+--------------+   +---------+---------------+---------+-----------+----------+--------------+ LEFT     CompressibilityPhasicitySpontaneityPropertiesThrombus Aging +---------+---------------+---------+-----------+----------+--------------+ CFV      Full  Yes      Yes                                  +---------+---------------+---------+-----------+----------+--------------+ SFJ      Full                                                        +---------+---------------+---------+-----------+----------+--------------+ FV Prox  Full           Yes      Yes                                 +---------+---------------+---------+-----------+----------+--------------+ FV Mid   Full           Yes      Yes                                 +---------+---------------+---------+-----------+----------+--------------+ FV DistalFull           Yes      Yes                                 +---------+---------------+---------+-----------+----------+--------------+ PFV      Full                                                        +---------+---------------+---------+-----------+----------+--------------+ POP      Full           Yes      Yes                                 +---------+---------------+---------+-----------+----------+--------------+ PTV      Full                                                        +---------+---------------+---------+-----------+----------+--------------+ PERO     Full                                                        +---------+---------------+---------+-----------+----------+--------------+     Summary: BILATERAL: - No evidence of deep vein thrombosis seen in the lower extremities, bilaterally. - No evidence of superficial venous thrombosis in the lower extremities, bilaterally. -No evidence of popliteal cyst, bilaterally. -Subcutaneous edema of BLE.  LEFT: - Ultrasound characteristics of enlarged lymph nodes noted in the groin.  *See table(s) above for measurements and observations. Electronically signed by Sherald Hess MD on 07/16/2020 at 2:50:12 PM.    Final      Assessment and Plan:   Acute systolic and  diastolic heart failure, new diagnosis Cardiomyopathy of unclear etiology  Pulmonary hypertension -Presented with 1  month duration of worsening exertional shortness of breath, lower extremity edema -High sensitive troponin 36 >28 -BNP elevated 564 -Chest x-ray did not reveal acute finding -Echocardiogram 7/15 showed EF <20%, LV global hypokinesis, LV moderately dilated, mild concentric LVH, grade III DD (restrictive), elevated LA pressure, no LV thrombus, RV systolic moderately reduced, RV moderately reduced, moderate elevated PASP with RVSP 54.7 mmHg, severely dilated LA, moderately dilated RA, small pericardial effusion, mild to moderate MR, mild to moderate TR, mild dilatation of the ascending aorta measuring 41 mm.  - Continue IV diuresis with Lasix 40 mg twice daily until euvolemic  - Please monitor strict intake and output, daily weight, low-sodium diet, fluid restriction <1.5L daily - etiology include meth abuse, HTN, cardiac amyloidosis, CAD  - Discussed with patient with left and right heart catheterization, he is agreeable, will add to Monday 07/19/20 - will send SPEP, UPEP, potential cardiac MRI inpatient for amyloidosis workup - GDMT: will start metoprolol XL 25mg , Entresto 24/26mg  BID, if BP and renal function tolerating, add MRA and SGLT2i (un-insured, will need SW assistance for resource)  Elevated troponin -High sensitive troponin 36>28 -EKG with TWI of inferolateral leads, no previous EKG available to compare  -not consistent with ACS, possible demand ischemia from CHF   LLE cellulitis - clinically suspect MSSA, no purulent drainage/abscess - antibiotic per IM    Elevated D-dimer -Lower extremity Doppler negative for DVT, CTA negative for PE  Pulmonary nodules -Noted on CTA, need outpatient follow-up for malignancy screening  Normocytic anemia -Work-up per IM  Tobacco abuse -Discussed smoking cessation  Sarcoidosis - need to establish outpatient follow up   History of asthma -Clinically with moderate exacerbation with diffuse wheezing, consider PRN albuterol Neb treatment and a  course of prednisone , will need repeat outpatient PFT   Shared Decision Making/Informed Consent  The risks [stroke (1 in 1000), death (1 in 1000), kidney failure [usually temporary] (1 in 500), bleeding (1 in 200), allergic reaction [possibly serious] (1 in 200)], benefits (diagnostic support and management of coronary artery disease) and alternatives of a cardiac catheterization were discussed in detail with Mr. Rahimi and he is willing to proceed.   Risk Assessment/Risk Scores:    New York Heart Association (NYHA) Functional Class NYHA Class III    For questions or updates, please contact [facility] Please consult [email or web address] for contact info under    Signed, Pernell Dupre, NP  07/16/2020 3:39 PM  History and all data above reviewed.  Patient examined.  I agree with the findings as above.  Patient is a pleasant gentleman without prior cardiac history.  In retrospect for about 6 weeks he has noticed some increased dyspnea with activity such as doing some yard work.  He lives with a friend in a house in the  [place] area.  For the last few weeks he has noticed increased lower extremity swelling.  He is not describing PND or orthopnea.  He has not been having any cough fevers or chills.  He might gained about 20 pounds but he does not weigh himself routinely.  He finally came in because he was having increasing dyspnea.  He is noted as above to have a markedly abnormal echocardiogram.  Ejection fraction is severely reduced.  He does have some mild apical sparing the hypokinetic.  He has akinesis of his other segments and hyperechoic appearance perhaps consistent with amyloid.  He does have  significant coronary artery disease risk factors with a long smoking history.  However, he has not had any recent chest pressure, neck or arm discomfort.  He has not had any palpitations, presyncope or syncope.  The patient exam reveals COR: Regular rate and rhythm, distant heart sounds, no obvious murmurs,   Lungs: Decreased breath sounds, no wheezing, some coarse crackles and fine crackles at the bases,  Abd: Mildly distended with lower abdominal edema, Ext anasarca up to the sacrum, erythema bilaterally.  Neck jugular venous distention to the jaw all available labs, radiology testing, previous records reviewed. Agree with documented assessment and plan.  Acute systolic heart failure: The patient has acute systolic heart failure with markedly reduced ejection fraction.  This could be an ischemic etiology although I would favor infiltrative.  He is going to be admitted to [facility] and is on the board for right and left heart catheterization.  He will likely need an MRI.  He is responding nicely to diuresis.  I would continue with this.  He has been started on low-dose beta-blocker and I think this is reasonable.  We are going to start [facility].  Fayrene Fearing Enda Santo  4:46 PM  07/16/2020

## 2020-07-17 DIAGNOSIS — I272 Pulmonary hypertension, unspecified: Secondary | ICD-10-CM

## 2020-07-17 DIAGNOSIS — I5041 Acute combined systolic (congestive) and diastolic (congestive) heart failure: Secondary | ICD-10-CM

## 2020-07-17 LAB — CBC
HCT: 34.3 % — ABNORMAL LOW (ref 39.0–52.0)
Hemoglobin: 11.1 g/dL — ABNORMAL LOW (ref 13.0–17.0)
MCH: 30.7 pg (ref 26.0–34.0)
MCHC: 32.4 g/dL (ref 30.0–36.0)
MCV: 95 fL (ref 80.0–100.0)
Platelets: 265 10*3/uL (ref 150–400)
RBC: 3.61 MIL/uL — ABNORMAL LOW (ref 4.22–5.81)
RDW: 14.3 % (ref 11.5–15.5)
WBC: 7.5 10*3/uL (ref 4.0–10.5)
nRBC: 0 % (ref 0.0–0.2)

## 2020-07-17 LAB — TSH: TSH: 1.63 u[IU]/mL (ref 0.350–4.500)

## 2020-07-17 LAB — BASIC METABOLIC PANEL
Anion gap: 7 (ref 5–15)
BUN: 17 mg/dL (ref 6–20)
CO2: 33 mmol/L — ABNORMAL HIGH (ref 22–32)
Calcium: 8.3 mg/dL — ABNORMAL LOW (ref 8.9–10.3)
Chloride: 95 mmol/L — ABNORMAL LOW (ref 98–111)
Creatinine, Ser: 1.01 mg/dL (ref 0.61–1.24)
GFR, Estimated: >60 mL/min (ref >60)
Glucose, Bld: 103 mg/dL — ABNORMAL HIGH (ref 70–99)
Potassium: 3.7 mmol/L (ref 3.5–5.1)
Sodium: 135 mmol/L (ref 135–145)

## 2020-07-17 LAB — MAGNESIUM: Magnesium: 1.8 mg/dL (ref 1.7–2.4)

## 2020-07-17 MED ORDER — DIGOXIN 125 MCG PO TABS
0.1250 mg | ORAL_TABLET | Freq: Every day | ORAL | Status: DC
  Administered 2020-07-17 – 2020-07-20 (×4): 0.125 mg via ORAL
  Filled 2020-07-17 (×4): qty 1

## 2020-07-17 MED ORDER — POTASSIUM CHLORIDE CRYS ER 20 MEQ PO TBCR
40.0000 meq | EXTENDED_RELEASE_TABLET | Freq: Every day | ORAL | Status: DC
  Administered 2020-07-18 – 2020-07-19 (×2): 40 meq via ORAL
  Filled 2020-07-17 (×2): qty 2

## 2020-07-17 MED ORDER — SPIRONOLACTONE 25 MG PO TABS
25.0000 mg | ORAL_TABLET | Freq: Every day | ORAL | Status: DC
  Administered 2020-07-18 – 2020-07-20 (×3): 25 mg via ORAL
  Filled 2020-07-17 (×3): qty 1

## 2020-07-17 MED ORDER — ALBUTEROL SULFATE (2.5 MG/3ML) 0.083% IN NEBU
2.5000 mg | INHALATION_SOLUTION | RESPIRATORY_TRACT | Status: DC | PRN
  Administered 2020-07-18: 2.5 mg via RESPIRATORY_TRACT
  Filled 2020-07-17 (×3): qty 3

## 2020-07-17 MED ORDER — POTASSIUM CHLORIDE CRYS ER 20 MEQ PO TBCR
40.0000 meq | EXTENDED_RELEASE_TABLET | Freq: Once | ORAL | Status: AC
  Administered 2020-07-17: 40 meq via ORAL
  Filled 2020-07-17: qty 2

## 2020-07-17 MED ORDER — SPIRONOLACTONE 12.5 MG HALF TABLET
12.5000 mg | ORAL_TABLET | Freq: Every day | ORAL | Status: DC
  Administered 2020-07-17: 12.5 mg via ORAL
  Filled 2020-07-17: qty 1

## 2020-07-17 MED ORDER — MAGNESIUM SULFATE 2 GM/50ML IV SOLN
2.0000 g | Freq: Once | INTRAVENOUS | Status: AC
  Administered 2020-07-17: 2 g via INTRAVENOUS
  Filled 2020-07-17: qty 50

## 2020-07-17 MED ORDER — EMPAGLIFLOZIN 10 MG PO TABS
10.0000 mg | ORAL_TABLET | Freq: Every day | ORAL | Status: DC
  Administered 2020-07-17 – 2020-07-20 (×4): 10 mg via ORAL
  Filled 2020-07-17 (×4): qty 1

## 2020-07-17 MED ORDER — MAGNESIUM OXIDE -MG SUPPLEMENT 400 (240 MG) MG PO TABS
400.0000 mg | ORAL_TABLET | Freq: Every day | ORAL | Status: DC
  Administered 2020-07-17 – 2020-07-20 (×4): 400 mg via ORAL
  Filled 2020-07-17 (×4): qty 1

## 2020-07-17 NOTE — Plan of Care (Signed)
  Problem: Education: Goal: Knowledge of General Education information will improve Description: Including pain rating scale, medication(s)/side effects and non-pharmacologic comfort measures 07/17/2020 0727 by Elnita Maxwell, RN Outcome: Progressing 07/16/2020 1832 by Elnita Maxwell, RN Outcome: Progressing   Problem: Health Behavior/Discharge Planning: Goal: Ability to manage health-related needs will improve 07/17/2020 0727 by Elnita Maxwell, RN Outcome: Progressing 07/16/2020 1832 by Elnita Maxwell, RN Outcome: Progressing   Problem: Clinical Measurements: Goal: Ability to maintain clinical measurements within normal limits will improve 07/17/2020 0727 by Elnita Maxwell, RN Outcome: Progressing 07/16/2020 1832 by Elnita Maxwell, RN Outcome: Progressing   Problem: Clinical Measurements: Goal: Will remain free from infection 07/17/2020 0727 by Elnita Maxwell, RN Outcome: Progressing 07/16/2020 1832 by Elnita Maxwell, RN Outcome: Progressing   Problem: Clinical Measurements: Goal: Diagnostic test results will improve 07/17/2020 0727 by Elnita Maxwell, RN Outcome: Progressing 07/16/2020 1832 by Elnita Maxwell, RN Outcome: Progressing

## 2020-07-17 NOTE — Consult Note (Signed)
Advanced Heart Failure Team Consult Note   Primary Physician: Pcp, No PCP-Cardiologist:  None  Reason for Consultation: Newly diagnosed cardiomyopathy   HPI:    Nykolas Bacallao is seen today for evaluation of newly diagnosed cardiomyopathy at the request of Dr. Antoine Poche.  He has a medical history significant for asthma and tobacco use who presented to the [facility] for evaluation of worsening shortness of breath, orthopnea and edema of the lower extremities and scrotum.  He has no known cardiac history, but reports that over the past month, his symptoms have worsened.  In the ED, labs were significant for a potassium of 3.3, a BNP of 564 and an elevated d-dimer.  A subsequent CTA was negative for PE and a CXR was unremarkable.  An EKG showed T-wave inversions inferolaterally and tachycardia.  He received a dose of Lasix with >6L of subsequent urine output.  An echocardiogram on 07/16/20 found an EF of <20 percent, severe global LV hypokinesis, mild LVH, moderately dilated LV, grade 3 diastolic dysfunction, PASP of 16.1 mmHg with moderately reduced RV function, a small pericardial effusion, mild to moderate MR, mild to moderate TR and an estimated RAP of 15 mmHg.   Review of Systems: [y] = yes, [ ]  = no  Negative unless reported in HPI   Home Medications Prior to Admission medications   Not on File    Past Medical History: Past Medical History:  Diagnosis Date   Asthma    Neuropathy     Past Surgical History: Past Surgical History:  Procedure Laterality Date   APPENDECTOMY     HERNIA REPAIR      Family History: Family History  Problem Relation Age of Onset   Crohn's disease Mother     Social History: Social History   Socioeconomic History   Marital status: Widowed    Spouse name: Not on file   Number of children: Not on file   Years of education: Not on file   Highest education level: Not on file  Occupational History   Not on file  Tobacco Use   Smoking status: Every  Day    Packs/day: 0.50    Years: 30.00    Pack years: 15.00    Types: Cigarettes   Smokeless tobacco: Never  Substance and Sexual Activity   Alcohol use: Not Currently   Drug use: Not Currently   Sexual activity: Not on file  Other Topics Concern   Not on file  Social History Narrative   Not on file   Social Determinants of Health   Financial Resource Strain: Not on file  Food Insecurity: Not on file  Transportation Needs: Not on file  Physical Activity: Not on file  Stress: Not on file  Social Connections: Not on file    Allergies:  Allergies  Allergen Reactions   Penicillins Hives    Objective:    Vital Signs:   Temp:  [98.2 F (36.8 C)-98.7 F (37.1 C)] 98.2 F (36.8 C) (07/16 0322) Pulse Rate:  [80-121] 96 (07/16 0322) Resp:  [16-19] 18 (07/16 0322) BP: (94-122)/(68-107) 99/72 (07/16 0322) SpO2:  [87 %-100 %] 87 % (07/16 0322) Weight:  [84.6 kg-85.2 kg] 84.6 kg (07/16 0322) Last BM Date: 07/16/20  Weight change: Filed Weights   07/16/20 0206 07/16/20 1806 07/17/20 0322  Weight: 88 kg 85.2 kg 84.6 kg    Intake/Output:   Intake/Output Summary (Last 24 hours) at 07/17/2020 1151 Last data filed at 07/17/2020 1100 Gross per 24 hour  Intake 360 ml  Output 7566 ml  Net -7206 ml      Physical Exam    See MD exam   Telemetry   Sinus/sinus tach 95-110 Personally reviewed   EKG    Sinus tach 112 + LVH Personally reviewed  Labs   Basic Metabolic Panel: Recent Labs  Lab 07/16/20 0412 07/16/20 0926 07/17/20 0341  NA 137  --  135  K 3.3*  --  3.7  CL 104  --  95*  CO2 25  --  33*  GLUCOSE 93  --  103*  BUN 24*  --  17  CREATININE 0.96 1.10 1.01  CALCIUM 7.6*  --  8.3*  MG  --   --  1.8    Liver Function Tests: Recent Labs  Lab 07/16/20 0412  AST 39  ALT 42  ALKPHOS 67  BILITOT 0.8  PROT 5.6*  ALBUMIN 3.2*   No results for input(s): LIPASE, AMYLASE in the last 168 hours. No results for input(s): AMMONIA in the last 168  hours.  CBC: Recent Labs  Lab 07/16/20 0213 07/16/20 0926 07/17/20 0341  WBC 13.3* 10.6* 7.5  HGB 12.1* 11.1* 11.1*  HCT 36.8* 34.5* 34.3*  MCV 96.6 96.6 95.0  PLT 333 286 265    Cardiac Enzymes: No results for input(s): CKTOTAL, CKMB, CKMBINDEX, TROPONINI in the last 168 hours.  BNP: BNP (last 3 results) Recent Labs    07/16/20 0223  BNP 564.0*    ProBNP (last 3 results) No results for input(s): PROBNP in the last 8760 hours.   CBG: No results for input(s): GLUCAP in the last 168 hours.  Coagulation Studies: No results for input(s): LABPROT, INR in the last 72 hours.   Imaging   VAS [place] LOWER EXTREMITY VENOUS (DVT)  Result Date: 07/16/2020  Lower Venous DVT Study Patient Name:  DONTREAL MIERA  Date of Exam:   07/16/2020 Medical Rec #: [id number]     Accession #:    [id number] Date of Birth: 02-06-1970     Patient Gender: M Patient Age:   50Y Exam Location:  [facility] Procedure:      VAS [place] LOWER EXTREMITY VENOUS (DVT) Referring Phys: [id number] Eduard Clos --------------------------------------------------------------------------------  Indications: Edema.  Limitations: Poor ultrasound/tissue interface. Comparison Study: No previous exams Performing Technologist: Jody Hill RVT, RDMS  Examination Guidelines: A complete evaluation includes B-mode imaging, spectral Doppler, color Doppler, and power Doppler as needed of all accessible portions of each vessel. Bilateral testing is considered an integral part of a complete examination. Limited examinations for reoccurring indications may be performed as noted. The reflux portion of the exam is performed with the patient in reverse Trendelenburg.  +---------+---------------+---------+-----------+----------+--------------+ RIGHT    CompressibilityPhasicitySpontaneityPropertiesThrombus Aging +---------+---------------+---------+-----------+----------+--------------+ CFV      Full           Yes      Yes                                  +---------+---------------+---------+-----------+----------+--------------+ SFJ      Full                                                        +---------+---------------+---------+-----------+----------+--------------+ FV Prox  Full  Yes      Yes                                 +---------+---------------+---------+-----------+----------+--------------+ FV Mid   Full           Yes      Yes                                 +---------+---------------+---------+-----------+----------+--------------+ FV DistalFull           Yes      Yes                                 +---------+---------------+---------+-----------+----------+--------------+ PFV      Full                                                        +---------+---------------+---------+-----------+----------+--------------+ POP      Full           Yes      Yes                                 +---------+---------------+---------+-----------+----------+--------------+ PTV      Full                                                        +---------+---------------+---------+-----------+----------+--------------+ PERO     Full                                                        +---------+---------------+---------+-----------+----------+--------------+   +---------+---------------+---------+-----------+----------+--------------+ LEFT     CompressibilityPhasicitySpontaneityPropertiesThrombus Aging +---------+---------------+---------+-----------+----------+--------------+ CFV      Full           Yes      Yes                                 +---------+---------------+---------+-----------+----------+--------------+ SFJ      Full                                                        +---------+---------------+---------+-----------+----------+--------------+ FV Prox  Full           Yes      Yes                                  +---------+---------------+---------+-----------+----------+--------------+ FV Mid   Full           Yes      Yes                                 +---------+---------------+---------+-----------+----------+--------------+  FV DistalFull           Yes      Yes                                 +---------+---------------+---------+-----------+----------+--------------+ PFV      Full                                                        +---------+---------------+---------+-----------+----------+--------------+ POP      Full           Yes      Yes                                 +---------+---------------+---------+-----------+----------+--------------+ PTV      Full                                                        +---------+---------------+---------+-----------+----------+--------------+ PERO     Full                                                        +---------+---------------+---------+-----------+----------+--------------+     Summary: BILATERAL: - No evidence of deep vein thrombosis seen in the lower extremities, bilaterally. - No evidence of superficial venous thrombosis in the lower extremities, bilaterally. -No evidence of popliteal cyst, bilaterally. -Subcutaneous edema of BLE.  LEFT: - Ultrasound characteristics of enlarged lymph nodes noted in the groin.  *See table(s) above for measurements and observations. Electronically signed by Sherald Hess MD on 07/16/2020 at 2:50:12 PM.    Final      Medications:     Current Medications:  aspirin  81 mg Oral Pre-Cath   doxycycline  100 mg Oral Q12H   empagliflozin  10 mg Oral Daily   enoxaparin (LOVENOX) injection  40 mg Subcutaneous Q24H   furosemide  40 mg Intravenous Q12H   magnesium oxide  400 mg Oral Daily   [START ON 07/18/2020] potassium chloride  40 mEq Oral Daily   sacubitril-valsartan  1 tablet Oral BID   sodium chloride flush  3 mL Intravenous Q12H   spironolactone  12.5 mg Oral Daily     Infusions:  sodium chloride     [START ON 07/19/2020] sodium chloride        Patient Profile   Mr. Bogus is a 50 y/o male with a medical history significant for asthma and tobacco use who presented to the [facility] ED for evaluation of dyspnea, orthopnea and edema of the lower extremities and scrotum.  An echocardiogram was notable for a new cardiomyopathy with an EF of <20 percent and moderately reduced RV function.   Assessment/Plan    Acute HFrEF Acute diastolic heart failure Cardiomyopathy  Pulmonary hypertension - Etiology of cardiomypathy unclear at this time -- LHC and RHC planned for Monday, 07/19/2020  - Continue IV Lasix for now and monitor volume  status closely  - GDMT started by cardiology: Sherryll BurgerEntresto 24/26 BID, spironolactone 12.5 mg daily, Jardiance 10 mg. Blood pressures notably soft, so holding on beta blocker  - Monitor renal function and electrolytes daily  - Strict I/O, flluid restriction, low sodium diet  - Workup for amyloidosis underway -- SPEP and UPEP pending, possible cardiac MRI under consideration  - Further recommendations pending left and right catheterizations  Elevated troponin - hsT elevated to 28 and 36 -- suspect related to demand ischemia and not ACS  Left leg erythema - Concern for cellulitis -- started on doxycycline by primary team  - US negative for DVT   Anemia - Appears normocytic - Hgb downtrending: 13.3 >> 10.6 >> 7.5 - Management per primary team   H/O asthma - Albuterol neb ordered by primary team   Tobacco use H/O methamphetamine use - cessation encouraged   Length of Stay: 1  Peter GarterKim M Carollo, NP  07/17/2020, 11:51 AM  Advanced Heart Failure Team Pager [phone] (M-F; 7a - 5p)  Please contact [facility] for night-coverage after hours (4p -7a ) and weekends on [email or web address]   Patient seen and examined with the above-signed Advanced Practice Provider and/or Housestaff. I personally reviewed laboratory data, imaging studies and  relevant notes. I independently examined the patient and formulated the important aspects of the plan. I have edited the note to reflect any of my changes or salient points. I have personally discussed the plan with the patient and/or family.  50 y/o male smoker admitted with acute systolic HF  Hstrop negative. ECG with LVH with repol.   Echo LVEF ~20% with global HK, G3DD, prominent LV trabeculations. RV moderately down  Denies CP. Has been taking OTC stimulants to have more energy (denies meth or ETOH)    General:  Sitting up in bed No resp difficulty HEENT: normal Neck: supple. JVP to jaw  Carotids 2+ bilat; no bruits. No lymphadenopathy or thryomegaly appreciated. Cor: PMI nondisplaced. Regular tachy + s3 Lungs: clear Abdomen: soft, nontender, nondistended. No hepatosplenomegaly. No bruits or masses. Good bowel sounds. Extremities: no cyanosis, clubbing, rash, 2+ edema Neuro: alert & orientedx3, cranial nerves grossly intact. moves all 4 extremities w/o difficulty. Affect pleasant  He remains volume overloaded. Agree with continuing IV diuresis. Pulse pressure is narrow. Will add low-dose digoxin. Increase spiro. R/L cath Monday followed by cMRI. Place compression hose.   Arvilla Meresaniel Kayzen Kendzierski, MD  1:41 PM

## 2020-07-17 NOTE — Progress Notes (Signed)
PROGRESS NOTE    Caleb Landry  KYH:[id number] DOB: [birth year] DOA: 07/16/2020 PCP: Pcp, No   Chief Complaint  Patient presents with   Leg Swelling   Shortness of Breath    Brief Narrative: Caleb Landry is Caleb Landry 50 y.o. male with history of asthma, tobacco abuse presents to the ER because of worsening swelling of the lower extremities.  Patient states he noticed the swelling on the left lower extremity over the last 1 month and the right lower extremity started swelling over the last few days which has progressed very quick over the last few days involving his scrotum at this time.  Over the last few days he also has been having shortness of breath exertional and orthopnea.  Denies any chest pain productive cough fever or chills.  Patient states he used to be on albuterol for asthma which he is off at this time since he moved to [place] over [place] year ago from [place].   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  SPEP/UPEP, considering cardiac MRI inpatient.   History of Asthma Wheezing noted today, start albuterol, hold off on steroids right now Follow   Lower Extremity Swelling  Presumed Left Lower Extremity Cellulitis Continue doxycycline Enlarged LN's in Groin on Left - Noted, possibly 2/2 cellulitis - follow   Tobacco Abuse Encourage cessation   Normocytic Anemia Follow anemia labs  DVT prophylaxis: lovenox Code Status: full  Family  Communication: none at bedside Disposition:   Status is: Inpatient  Remains inpatient appropriate because:Inpatient level of care appropriate due to severity of illness  Dispo: The patient is from: Home              Anticipated d/c is to: Home              Patient currently is not medically stable to d/c.   Difficult to place patient No       Consultants:  cardiology  Procedures:  Echo IMPRESSIONS     1. Left ventricular ejection fraction, by estimation, is <20%. The left  ventricle has severely decreased function. The left ventricle demonstrates  global hypokinesis. The left ventricular internal cavity size was  moderately dilated. There is mild  concentric left ventricular hypertrophy. Left ventricular diastolic  parameters are consistent with Grade III diastolic dysfunction  (restrictive). Elevated left atrial pressure. Non Compaction Fraction <  2.3 my [name]. No LV thrombus.   2. Right ventricular systolic function is moderately reduced. The right  ventricular size is moderately enlarged. There is moderately elevated  pulmonary artery systolic pressure. The estimated right ventricular  systolic pressure is 54.7 mmHg.   3. Left atrial size was severely dilated.   4. Right atrial size was moderately dilated.   5. Shemuel Harkleroad small pericardial effusion is present.   6. The mitral valve is grossly normal. Mild to moderate mitral valve  regurgitation- mechanism appears to be ventricular functional. No evidence  of mitral stenosis.   7. Tricuspid valve regurgitation is  mild to moderate- mechanisms appears  to be ventricular functional.   8. The aortic valve was not well visualized. Aortic valve regurgitation  is not visualized. No aortic stenosis is present.   9. Aortic dilatation noted. There is mild dilatation of the ascending  aorta, measuring 41 mm.  10. The inferior vena cava is dilated in size with <50% respiratory  variability, suggesting right atrial  pressure of 15 mmHg.   Comparison(s): No prior Echocardiogram.   Conclusion(s)/Recommendation(s): New Biventricular dysfunction. Discussed  with primary MD. Constrasted images do not meet criteria for LV  non-compaction or show LV thrombus. CMR could be considered in the future  if clinically indicated.   LE [place] Summary:  BILATERAL:  - No evidence of deep vein thrombosis seen in the lower extremities,  bilaterally.  - No evidence of superficial venous thrombosis in the lower extremities,  bilaterally.  -No evidence of popliteal cyst, bilaterally.  -Subcutaneous edema of BLE.     LEFT:  - Ultrasound characteristics of enlarged lymph nodes noted in the groin  Antimicrobials: Anti-infectives (From admission, onward)    Start     Dose/Rate Route Frequency Ordered Stop   07/16/20 1000  doxycycline (VIBRA-TABS) tablet 100 mg        100 mg Oral Every 12 hours 07/16/20 0903     07/16/20 0845  clindamycin (CLEOCIN) IVPB 600 mg  Status:  Discontinued        600 mg 100 mL/hr over 30 Minutes Intravenous  Once 07/16/20 0843 07/16/20 0902          Subjective: Feeling ebtter, improved LE swelling  Objective: Vitals:   07/16/20 2150 07/16/20 2244 07/16/20 2340 07/17/20 0322  BP: 94/68 101/75 103/75 99/72  Pulse:   100 96  Resp:   18 18  Temp:   98.6 F (37 C) 98.2 F (36.8 C)  TempSrc:   Oral Oral  SpO2:   90% (!) 87%  Weight:    84.6 kg  Height:        Intake/Output Summary (Last 24 hours) at 07/17/2020 0846 Last data filed at 07/17/2020 0325 Gross per 24 hour  Intake --  Output 5450 ml  Net -5450 ml   Filed Weights   07/16/20 0206 07/16/20 1806 07/17/20 0322  Weight: 88 kg 85.2 kg 84.6 kg    Examination:  General exam: Appears calm and comfortable  Respiratory system: diffuse wheezing Cardiovascular system: S1 & S2 heard, RRR. +JVD Gastrointestinal system: Abdomen is nondistended, soft and nontender. Central nervous system: Alert and oriented. No focal  neurological deficits. Extremities: bilateral LE edema Skin: No rashes, lesions or ulcers Psychiatry: Judgement and insight appear normal. Mood & affect appropriate.     Data Reviewed: I have personally reviewed following labs and imaging studies  CBC: Recent Labs  Lab 07/16/20 0213 07/16/20 0926 07/17/20 0341  WBC 13.3* 10.6* 7.5  HGB 12.1* 11.1* 11.1*  HCT 36.8* 34.5* 34.3*  MCV 96.6 96.6 95.0  PLT 333 286 265    Basic Metabolic Panel: Recent Labs  Lab 07/16/20 0412 07/16/20 0926 07/17/20 0341  NA 137  --  135  K 3.3*  --  3.7  CL 104  --  95*  CO2 25  --  33*  GLUCOSE 93  --  103*  BUN 24*  --  17  CREATININE 0.96 1.10 1.01  CALCIUM 7.6*  --  8.3*  MG  --   --  1.8    GFR: Estimated Creatinine Clearance: 91.4 mL/min (  by C-G formula based on SCr of 1.01 mg/dL).  Liver Function Tests: Recent Labs  Lab 07/16/20 0412  AST 39  ALT 42  ALKPHOS 67  BILITOT 0.8  PROT 5.6*  ALBUMIN 3.2*    CBG: No results for input(s): GLUCAP in the last 168 hours.   Recent Results (from the past 240 hour(s))  Resp Panel by RT-PCR (Flu Yichen Gilardi&B, Covid) Nasopharyngeal Swab     Status: None   Collection Time: 07/16/20  2:23 AM   Specimen: Nasopharyngeal Swab; Nasopharyngeal(NP) swabs in vial transport medium  Result Value Ref Range Status   SARS Coronavirus 2 by RT PCR NEGATIVE NEGATIVE Final    Comment: (NOTE) SARS-CoV-2 target nucleic acids are NOT DETECTED.  The SARS-CoV-2 RNA is generally detectable in upper respiratory specimens during the acute phase of infection. The lowest concentration of SARS-CoV-2 viral copies this assay can detect is 138 copies/mL. Kamia Insalaco negative result does not preclude SARS-Cov-2 infection and should not be used as the sole basis for treatment or other patient management decisions. Adan Baehr negative result may occur with  improper specimen collection/handling, submission of specimen other than nasopharyngeal swab, presence of viral mutation(s) within  the areas targeted by this assay, and inadequate number of viral copies(<138 copies/mL). Dreonna Hussein negative result must be combined with clinical observations, patient history, and epidemiological information. The expected result is Negative.  Fact Sheet for Patients:  BloggerCourse.comhttps://www.fda.gov/media/152166/download  Fact Sheet for Healthcare Providers:  SeriousBroker.ithttps://www.fda.gov/media/152162/download  This test is no t yet approved or cleared by the [place] FDA and  has been authorized for detection and/or diagnosis of SARS-CoV-2 by FDA under an Emergency Use Authorization (EUA). This EUA will remain  in effect (meaning this test can be used) for the duration of the COVID-19 declaration under Section 564(b)(1) of the Act, 21 U.S.C.section 360bbb-3(b)(1), unless the authorization is terminated  or revoked sooner.       Influenza Dalonda Simoni by PCR NEGATIVE NEGATIVE Final   Influenza B by PCR NEGATIVE NEGATIVE Final    Comment: (NOTE) The Xpert Xpress SARS-CoV-2/FLU/RSV plus assay is intended as an aid in the diagnosis of influenza from Nasopharyngeal swab specimens and should not be used as Manasi Dishon sole basis for treatment. Nasal washings and aspirates are unacceptable for Xpert Xpress SARS-CoV-2/FLU/RSV testing.  Fact Sheet for Patients: BloggerCourse.comhttps://www.fda.gov/media/152166/download  Fact Sheet for Healthcare Providers: SeriousBroker.ithttps://www.fda.gov/media/152162/download  This test is not yet approved or cleared by the [place] FDA and has been authorized for detection and/or diagnosis of SARS-CoV-2 by FDA under an Emergency Use Authorization (EUA). This EUA will remain in effect (meaning this test can be used) for the duration of the COVID-19 declaration under Section 564(b)(1) of the Act, 21 U.S.C. section 360bbb-3(b)(1), unless the authorization is terminated or revoked.  Performed at [facility], [address] [place], [place] [address]          Radiology Studies: DG Chest  2 View  Result Date: 07/16/2020 CLINICAL DATA:  50 year old male with shortness of breath. EXAM: CHEST - 2 VIEW COMPARISON:  None FINDINGS: Mild diffuse chronic interstitial coarsening and emphysema. No focal consolidation, pleural effusion, or pneumothorax. Mild cardiomegaly. No acute osseous pathology. IMPRESSION: No acute cardiopulmonary process. Electronically Signed   By: Elgie CollardArash  Radparvar M.D.   On: 07/16/2020 03:06   CT Angio Chest Pulmonary Embolism (PE) W or WO Contrast  Result Date: 07/16/2020 CLINICAL DATA:  Leg swelling.  Shortness of breath. EXAM: CT ANGIOGRAPHY CHEST WITH CONTRAST TECHNIQUE: Multidetector CT imaging of the chest  was performed using the standard protocol during bolus administration of intravenous contrast. Multiplanar CT image reconstructions and MIPs were obtained to evaluate the vascular anatomy. CONTRAST:  75mL OMNIPAQUE IOHEXOL 350 MG/ML SOLN COMPARISON:  None. FINDINGS: Cardiovascular: No filling defects in the pulmonary arteries to suggest pulmonary emboli. Heart is mildly enlarged. Aorta normal caliber. Mediastinum/Nodes: No mediastinal, hilar, or axillary adenopathy. Trachea and esophagus are unremarkable. Thyroid unremarkable. Lungs/Pleura: Biapical pulmonary nodules measuring up to 10 mm in the left apex and 9 mm in the right apex. Areas of apical and bibasilar scarring. Trace right pleural effusion. Upper Abdomen: Imaging into the upper abdomen demonstrates no acute findings. Musculoskeletal: Chest wall soft tissues are unremarkable. No acute bony abnormality. Review of the MIP images confirms the above findings. IMPRESSION: No evidence of pulmonary embolus. Cardiomegaly. Biapical and bibasilar scarring. Nodular densities in the upper lobes/apices bilaterally warrant follow-up. Non-contrast chest CT at 3-6 months is recommended. If the nodules are stable at time of repeat CT, then future CT at 18-24 months (from today's scan) is considered optional for low-risk patients,  but is recommended for high-risk patients. This recommendation follows the consensus statement: Guidelines for Management of Incidental Pulmonary Nodules Detected on CT Images: From the Fleischner Society 2017; Radiology 2017; 284:228-243. Trace right pleural effusion. Electronically Signed   By: Charlett Nose M.D.   On: 07/16/2020 11:38   ECHOCARDIOGRAM COMPLETE  Result Date: 07/16/2020    ECHOCARDIOGRAM REPORT   Patient Name:   Caleb Landry Date of Exam: 07/16/2020 Medical Rec #:  [id number]    Height:       70.0 in Accession #:    [id number]   Weight:       194.0 lb Date of Birth:  1970-09-28    BSA:          2.060 m Patient Age:    50 years     BP:           119/90 mmHg Patient Gender: M            HR:           105 bpm. Exam Location:  Inpatient Procedure: 2D Echo, 3D Echo, Cardiac Doppler and Color Doppler REPORT CONTAINS CRITICAL RESULT                                 MODIFIED REPORT:     This report was modified by Riley Lam MD on 07/16/2020 due to                                Additional images.  Indications:     I50.40* Unspecified combined systolic (congestive) and                  diastolic (congestive) heart failure  History:         Patient has no prior history of Echocardiogram examinations.                  CHF, Signs/Symptoms:Shortness of Breath and Dyspnea; Risk                  Factors:Current Smoker.  Sonographer:     Sheralyn Boatman RDCS Referring Phys:  [id number] Meryle Ready [facility] Diagnosing Phys: Riley Lam MD  Sonographer Comments: Suboptimal parasternal window and suboptimal subcostal window. Patient in high fowler's position due to dyspnea. IMPRESSIONS  1. Left ventricular ejection fraction, by estimation, is <20%. The left ventricle has severely decreased function. The left ventricle demonstrates global hypokinesis. The left ventricular internal cavity size was moderately dilated. There is mild concentric left ventricular hypertrophy. Left ventricular diastolic parameters are  consistent with Grade III diastolic dysfunction (restrictive). Elevated left atrial pressure. Non Compaction Fraction < 2.3 my [name]. No LV thrombus.  2. Right ventricular systolic function is moderately reduced. The right ventricular size is moderately enlarged. There is moderately elevated pulmonary artery systolic pressure. The estimated right ventricular systolic pressure is 54.7 mmHg.  3. Left atrial size was severely dilated.  4. Right atrial size was moderately dilated.  5. Malaia Buchta small pericardial effusion is present.  6. The mitral valve is grossly normal. Mild to moderate mitral valve regurgitation- mechanism appears to be ventricular functional. No evidence of mitral stenosis.  7. Tricuspid valve regurgitation is mild to moderate- mechanisms appears to be ventricular functional.  8. The aortic valve was not well visualized. Aortic valve regurgitation is not visualized. No aortic stenosis is present.  9. Aortic dilatation noted. There is mild dilatation of the ascending aorta, measuring 41 mm. 10. The inferior vena cava is dilated in size with <50% respiratory variability, suggesting right atrial pressure of 15 mmHg. Comparison(s): No prior Echocardiogram. Conclusion(s)/Recommendation(s): New Biventricular dysfunction. Discussed with primary MD. Constrasted images do not meet criteria for LV non-compaction or show LV thrombus. CMR could be considered in the future if clinically indicated. FINDINGS  Left Ventricle: Left ventricular ejection fraction, by estimation, is <20%. The left ventricle has severely decreased function. The left ventricle demonstrates global hypokinesis. The left ventricular internal cavity size was moderately dilated. There is mild concentric left ventricular hypertrophy. Left ventricular diastolic parameters are consistent with Grade III diastolic dysfunction (restrictive). Elevated left atrial pressure. Right Ventricle: The right ventricular size is moderately enlarged.  Right vetricular wall thickness was not well visualized. Right ventricular systolic function is moderately reduced. There is moderately elevated pulmonary artery systolic pressure. The tricuspid regurgitant velocity is 3.15 m/s, and with an assumed right atrial pressure of 15 mmHg, the estimated right ventricular systolic pressure is 54.7 mmHg. Left Atrium: Left atrial size was severely dilated. Right Atrium: Right atrial size was moderately dilated. Pericardium: Isel Skufca small pericardial effusion is present. Mitral Valve: The mitral valve is grossly normal. Mild to moderate mitral valve regurgitation. No evidence of mitral valve stenosis. Tricuspid Valve: The tricuspid valve is grossly normal. Tricuspid valve regurgitation is mild to moderate. No evidence of tricuspid stenosis. Aortic Valve: The aortic valve was not well visualized. Aortic valve regurgitation is not visualized. No aortic stenosis is present. Pulmonic Valve: The pulmonic valve was not well visualized. Pulmonic valve regurgitation is not visualized. No evidence of pulmonic stenosis. Aorta: The aortic root is normal in size and structure and aortic dilatation noted. There is mild dilatation of the ascending aorta, measuring 41 mm. Venous: The inferior vena cava is dilated in size with less than 50% respiratory variability, suggesting right atrial pressure of 15 mmHg. IAS/Shunts: The atrial septum is grossly normal.  LEFT VENTRICLE PLAX 2D LVIDd:         6.60 cm      Diastology LVIDs:         6.10 cm      LV e' medial:    6.09 cm/s LV PW:         1.40 cm      LV E/e' medial:  21.3 LV IVS:  1.20 cm      LV e' lateral:   7.29 cm/s LVOT diam:     2.30 cm      LV E/e' lateral: 17.8 LV SV:         41 LV SV Index:   20 LVOT Area:     4.15 cm  LV Volumes (MOD) LV vol d, MOD A2C: 293.0 ml LV vol d, MOD A4C: 211.0 ml LV vol s, MOD A2C: 259.0 ml LV vol s, MOD A4C: 201.0 ml LV SV MOD A2C:     34.0 ml LV SV MOD A4C:     211.0 ml LV SV MOD BP:      25.1 ml RIGHT  VENTRICLE RV S prime:     5.87 cm/s TAPSE (M-mode): 1.1 cm LEFT ATRIUM              Index       RIGHT ATRIUM           Index LA diam:        2.80 cm  1.36 cm/m  RA Area:     23.70 cm LA Vol (A2C):   92.9 ml  45.09 ml/m RA Volume:   89.80 ml  43.58 ml/m LA Vol (A4C):   117.0 ml 56.78 ml/m LA Biplane Vol: 107.0 ml 51.93 ml/m  AORTIC VALVE LVOT Vmax:   78.80 cm/s LVOT Vmean:  50.900 cm/s LVOT VTI:    0.099 m  AORTA Ao Root diam: 3.90 cm Ao Asc diam:  4.10 cm MITRAL VALVE                TRICUSPID VALVE MV Area (PHT): 5.73 cm     TR Peak grad:   39.7 mmHg MV Decel Time: 133 msec     TR Vmax:        315.00 cm/s MV E velocity: 129.50 cm/s                             SHUNTS                             Systemic VTI:  0.10 m                             Systemic Diam: 2.30 cm Riley Lam MD Electronically signed by Riley Lam MD Signature Date/Time: 07/16/2020/10:59:36 AM    Final (Updated)    VAS [place] LOWER EXTREMITY VENOUS (DVT)  Result Date: 07/16/2020  Lower Venous DVT Study Patient Name:  MASTER TOUCHET  Date of Exam:   07/16/2020 Medical Rec #: [id number]     Accession #:    [id number] Date of Birth: Sep 10, 1970     Patient Gender: M Patient Age:   50Y Exam Location:  [facility] Procedure:      VAS [place] LOWER EXTREMITY VENOUS (DVT) Referring Phys: [id number] Eduard Clos --------------------------------------------------------------------------------  Indications: Edema.  Limitations: Poor ultrasound/tissue interface. Comparison Study: No previous exams Performing Technologist: Jody Hill RVT, RDMS  Examination Guidelines: Craig Wisnewski complete evaluation includes B-mode imaging, spectral Doppler, color Doppler, and power Doppler as needed of all accessible portions of each vessel. Bilateral testing is considered an integral part of Brentney Goldbach complete examination. Limited examinations for reoccurring indications may be performed as noted. The reflux portion of the exam is performed with the patient in reverse  Trendelenburg.  +---------+---------------+---------+-----------+----------+--------------+  RIGHT    CompressibilityPhasicitySpontaneityPropertiesThrombus Aging +---------+---------------+---------+-----------+----------+--------------+ CFV      Full           Yes      Yes                                 +---------+---------------+---------+-----------+----------+--------------+ SFJ      Full                                                        +---------+---------------+---------+-----------+----------+--------------+ FV Prox  Full           Yes      Yes                                 +---------+---------------+---------+-----------+----------+--------------+ FV Mid   Full           Yes      Yes                                 +---------+---------------+---------+-----------+----------+--------------+ FV DistalFull           Yes      Yes                                 +---------+---------------+---------+-----------+----------+--------------+ PFV      Full                                                        +---------+---------------+---------+-----------+----------+--------------+ POP      Full           Yes      Yes                                 +---------+---------------+---------+-----------+----------+--------------+ PTV      Full                                                        +---------+---------------+---------+-----------+----------+--------------+ PERO     Full                                                        +---------+---------------+---------+-----------+----------+--------------+   +---------+---------------+---------+-----------+----------+--------------+ LEFT     CompressibilityPhasicitySpontaneityPropertiesThrombus Aging +---------+---------------+---------+-----------+----------+--------------+ CFV      Full           Yes      Yes                                  +---------+---------------+---------+-----------+----------+--------------+  SFJ      Full                                                        +---------+---------------+---------+-----------+----------+--------------+ FV Prox  Full           Yes      Yes                                 +---------+---------------+---------+-----------+----------+--------------+ FV Mid   Full           Yes      Yes                                 +---------+---------------+---------+-----------+----------+--------------+ FV DistalFull           Yes      Yes                                 +---------+---------------+---------+-----------+----------+--------------+ PFV      Full                                                        +---------+---------------+---------+-----------+----------+--------------+ POP      Full           Yes      Yes                                 +---------+---------------+---------+-----------+----------+--------------+ PTV      Full                                                        +---------+---------------+---------+-----------+----------+--------------+ PERO     Full                                                        +---------+---------------+---------+-----------+----------+--------------+     Summary: BILATERAL: - No evidence of deep vein thrombosis seen in the lower extremities, bilaterally. - No evidence of superficial venous thrombosis in the lower extremities, bilaterally. -No evidence of popliteal cyst, bilaterally. -Subcutaneous edema of BLE.  LEFT: - Ultrasound characteristics of enlarged lymph nodes noted in the groin.  *See table(s) above for measurements and observations. Electronically signed by Sherald Hess MD on 07/16/2020 at 2:50:12 PM.    Final         Scheduled Meds:  aspirin  81 mg Oral Pre-Cath   doxycycline  100 mg Oral Q12H   enoxaparin (LOVENOX) injection  40 mg Subcutaneous Q24H   furosemide  40 mg  Intravenous Q12H   potassium chloride  20 mEq Oral Daily  sacubitril-valsartan  1 tablet Oral BID   sodium chloride flush  3 mL Intravenous Q12H   Continuous Infusions:  sodium chloride     [START ON 07/19/2020] sodium chloride       LOS: 1 day    Time spent: over 30 min    Lacretia Nicks, MD [facility]   To contact the attending provider between 7A-7P or the covering provider during after hours 7P-7A, please log into the web site [email or web address] and access using universal [place] password for that web site. If you do not have the password, please call the hospital operator.  07/17/2020, 8:46 AM

## 2020-07-17 NOTE — Progress Notes (Addendum)
Cardiology Progress Note  Patient ID: Caleb Landry MRN: [id number] DOB: 09-25-1970 Date of Encounter: 07/17/2020  Primary Cardiologist: None  Subjective   Chief Complaint: Shortness of breath  HPI: Net -6.3 L overnight.  Still volume overloaded.  Narrow pulse pressure.  Beta-blocker stopped.  He is not cool on examination.  Does not appear to be in shock.  Needs further diuresis.  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  Low suspicion for LV noncompaction.  ROS:  All other ROS reviewed and negative. Pertinent positives noted in the HPI.     Inpatient Medications  Scheduled Meds:  aspirin  81 mg Oral Pre-Cath   doxycycline  100 mg Oral Q12H   enoxaparin (LOVENOX) injection  40 mg Subcutaneous Q24H   furosemide  40 mg Intravenous Q12H   magnesium oxide  400 mg Oral Daily   potassium chloride  40 mEq Oral Once   [START ON 07/18/2020] potassium chloride  40 mEq Oral Daily   sacubitril-valsartan  1 tablet Oral BID   sodium chloride flush  3 mL Intravenous Q12H   spironolactone  12.5 mg Oral Daily   Continuous Infusions:  sodium chloride     [START ON 07/19/2020] sodium chloride     PRN Meds: sodium chloride, acetaminophen, albuterol, sodium chloride flush   Vital Signs   Vitals:   07/16/20 2150 07/16/20 2244 07/16/20 2340 07/17/20 0322  BP: 94/68 101/75 103/75 99/72  Pulse:   100 96  Resp:   18 18  Temp:   98.6 F (37 C) 98.2 F (36.8 C)  TempSrc:   Oral Oral  SpO2:   90% (!) 87%  Weight:    84.6 kg  Height:        Intake/Output Summary (Last 24 hours) at 07/17/2020 0857 Last data filed at 07/17/2020 0325 Gross per 24 hour  Intake --  Output 5450 ml  Net -5450 ml   Last 3 Weights 07/17/2020 07/16/2020 07/16/2020  Weight (lbs) 186 lb 9.6 oz 187 lb 14.4 oz 194 lb  Weight (kg) 84.641 kg 85.231 kg 87.998 kg      Telemetry  Overnight telemetry shows sinus tachycardia in the low 100s, which I personally reviewed.   ECG  The most recent ECG shows sinus tachycardia  heart rate 112, LVH with secondary repolarization abnormality, which I personally reviewed.   Physical Exam   Vitals:   07/16/20 2150 07/16/20 2244 07/16/20 2340 07/17/20 0322  BP: 94/68 101/75 103/75 99/72  Pulse:   100 96  Resp:   18 18  Temp:   98.6 F (37 C) 98.2 F (36.8 C)  TempSrc:   Oral Oral  SpO2:   90% (!) 87%  Weight:    84.6 kg  Height:        Intake/Output Summary (Last 24 hours) at 07/17/2020 0857 Last data filed at 07/17/2020 0325 Gross per 24 hour  Intake --  Output 5450 ml  Net -5450 ml    Last 3 Weights 07/17/2020 07/16/2020 07/16/2020  Weight (lbs) 186 lb 9.6 oz 187 lb 14.4 oz 194 lb  Weight (kg) 84.641 kg 85.231 kg 87.998 kg    Body mass index is 26.77 kg/m.  General: Well nourished, well developed, in no acute distress Head: Atraumatic, normal size  Eyes: PEERLA, EOMI  Neck: Supple, JVD up to earlobes Endocrine: No thryomegaly Cardiac: Normal S1, S2; tachycardia noted Lungs: Crackles at the lung bases Abd: Soft, nontender, no hepatomegaly  Ext: 2+ pitting edema Musculoskeletal: No deformities, BUE and BLE  strength normal and equal Skin: Warm and dry, no rashes   Neuro: Alert and oriented to person, place, time, and situation, CNII-XII grossly intact, no focal deficits  Psych: Normal mood and affect   Labs  High Sensitivity Troponin:   Recent Labs  Lab 07/16/20 0222 07/16/20 0412  TROPONINIHS 36* 28*     Cardiac EnzymesNo results for input(s): TROPONINI in the last 168 hours. No results for input(s): TROPIPOC in the last 168 hours.  Chemistry Recent Labs  Lab 07/16/20 0412 07/16/20 0926 07/17/20 0341  NA 137  --  135  K 3.3*  --  3.7  CL 104  --  95*  CO2 25  --  33*  GLUCOSE 93  --  103*  BUN 24*  --  17  CREATININE 0.96 1.10 1.01  CALCIUM 7.6*  --  8.3*  PROT 5.6*  --   --   ALBUMIN 3.2*  --   --   AST 39  --   --   ALT 42  --   --   ALKPHOS 67  --   --   BILITOT 0.8  --   --   GFRNONAA >60 >60 >60  ANIONGAP 8  --  7     Hematology Recent Labs  Lab 07/16/20 0213 07/16/20 0926 07/17/20 0341  WBC 13.3* 10.6* 7.5  RBC 3.81* 3.57* 3.61*  HGB 12.1* 11.1* 11.1*  HCT 36.8* 34.5* 34.3*  MCV 96.6 96.6 95.0  MCH 31.8 31.1 30.7  MCHC 32.9 32.2 32.4  RDW 14.6 14.4 14.3  PLT 333 286 265   BNP Recent Labs  Lab 07/16/20 0223  BNP 564.0*    DDimer  Recent Labs  Lab 07/16/20 0813  DDIMER 1.25*     Radiology  DG Chest 2 View  Result Date: 07/16/2020 CLINICAL DATA:  50 year old male with shortness of breath. EXAM: CHEST - 2 VIEW COMPARISON:  None FINDINGS: Mild diffuse chronic interstitial coarsening and emphysema. No focal consolidation, pleural effusion, or pneumothorax. Mild cardiomegaly. No acute osseous pathology. IMPRESSION: No acute cardiopulmonary process. Electronically Signed   By: Elgie CollardArash  Radparvar M.D.   On: 07/16/2020 03:06   CT Angio Chest Pulmonary Embolism (PE) W or WO Contrast  Result Date: 07/16/2020 CLINICAL DATA:  Leg swelling.  Shortness of breath. EXAM: CT ANGIOGRAPHY CHEST WITH CONTRAST TECHNIQUE: Multidetector CT imaging of the chest was performed using the standard protocol during bolus administration of intravenous contrast. Multiplanar CT image reconstructions and MIPs were obtained to evaluate the vascular anatomy. CONTRAST:  75mL OMNIPAQUE IOHEXOL 350 MG/ML SOLN COMPARISON:  None. FINDINGS: Cardiovascular: No filling defects in the pulmonary arteries to suggest pulmonary emboli. Heart is mildly enlarged. Aorta normal caliber. Mediastinum/Nodes: No mediastinal, hilar, or axillary adenopathy. Trachea and esophagus are unremarkable. Thyroid unremarkable. Lungs/Pleura: Biapical pulmonary nodules measuring up to 10 mm in the left apex and 9 mm in the right apex. Areas of apical and bibasilar scarring. Trace right pleural effusion. Upper Abdomen: Imaging into the upper abdomen demonstrates no acute findings. Musculoskeletal: Chest wall soft tissues are unremarkable. No acute bony abnormality.  Review of the MIP images confirms the above findings. IMPRESSION: No evidence of pulmonary embolus. Cardiomegaly. Biapical and bibasilar scarring. Nodular densities in the upper lobes/apices bilaterally warrant follow-up. Non-contrast chest CT at 3-6 months is recommended. If the nodules are stable at time of repeat CT, then future CT at 18-24 months (from today's scan) is considered optional for low-risk patients, but is recommended for high-risk patients. This recommendation  follows the consensus statement: Guidelines for Management of Incidental Pulmonary Nodules Detected on CT Images: From the Fleischner Society 2017; Radiology 2017; 284:228-243. Trace right pleural effusion. Electronically Signed   By: Charlett Nose M.D.   On: 07/16/2020 11:38   ECHOCARDIOGRAM COMPLETE  Result Date: 07/16/2020    ECHOCARDIOGRAM REPORT   Patient Name:   Caleb Landry Date of Exam: 07/16/2020 Medical Rec #:  [id number]    Height:       70.0 in Accession #:    [id number]   Weight:       194.0 lb Date of Birth:  09-Feb-1970    BSA:          2.060 m Patient Age:    50 years     BP:           119/90 mmHg Patient Gender: M            HR:           105 bpm. Exam Location:  Inpatient Procedure: 2D Echo, 3D Echo, Cardiac Doppler and Color Doppler REPORT CONTAINS CRITICAL RESULT                                 MODIFIED REPORT:     This report was modified by Riley Lam MD on 07/16/2020 due to                                Additional images.  Indications:     I50.40* Unspecified combined systolic (congestive) and                  diastolic (congestive) heart failure  History:         Patient has no prior history of Echocardiogram examinations.                  CHF, Signs/Symptoms:Shortness of Breath and Dyspnea; Risk                  Factors:Current Smoker.  Sonographer:     Sheralyn Boatman RDCS Referring Phys:  [id number] Meryle Ready [facility] Diagnosing Phys: Riley Lam MD  Sonographer Comments: Suboptimal parasternal window and  suboptimal subcostal window. Patient in high fowler's position due to dyspnea. IMPRESSIONS  1. Left ventricular ejection fraction, by estimation, is <20%. The left ventricle has severely decreased function. The left ventricle demonstrates global hypokinesis. The left ventricular internal cavity size was moderately dilated. There is mild concentric left ventricular hypertrophy. Left ventricular diastolic parameters are consistent with Grade III diastolic dysfunction (restrictive). Elevated left atrial pressure. Non Compaction Fraction < 2.3 my [name]. No LV thrombus.  2. Right ventricular systolic function is moderately reduced. The right ventricular size is moderately enlarged. There is moderately elevated pulmonary artery systolic pressure. The estimated right ventricular systolic pressure is 54.7 mmHg.  3. Left atrial size was severely dilated.  4. Right atrial size was moderately dilated.  5. A small pericardial effusion is present.  6. The mitral valve is grossly normal. Mild to moderate mitral valve regurgitation- mechanism appears to be ventricular functional. No evidence of mitral stenosis.  7. Tricuspid valve regurgitation is mild to moderate- mechanisms appears to be ventricular functional.  8. The aortic valve was not well visualized. Aortic valve regurgitation is not visualized. No aortic stenosis is present.  9. Aortic dilatation noted. There is mild dilatation of  the ascending aorta, measuring 41 mm. 10. The inferior vena cava is dilated in size with <50% respiratory variability, suggesting right atrial pressure of 15 mmHg. Comparison(s): No prior Echocardiogram. Conclusion(s)/Recommendation(s): New Biventricular dysfunction. Discussed with primary MD. Constrasted images do not meet criteria for LV non-compaction or show LV thrombus. CMR could be considered in the future if clinically indicated. FINDINGS  Left Ventricle: Left ventricular ejection fraction, by estimation, is <20%. The left  ventricle has severely decreased function. The left ventricle demonstrates global hypokinesis. The left ventricular internal cavity size was moderately dilated. There is mild concentric left ventricular hypertrophy. Left ventricular diastolic parameters are consistent with Grade III diastolic dysfunction (restrictive). Elevated left atrial pressure. Right Ventricle: The right ventricular size is moderately enlarged. Right vetricular wall thickness was not well visualized. Right ventricular systolic function is moderately reduced. There is moderately elevated pulmonary artery systolic pressure. The tricuspid regurgitant velocity is 3.15 m/s, and with an assumed right atrial pressure of 15 mmHg, the estimated right ventricular systolic pressure is 54.7 mmHg. Left Atrium: Left atrial size was severely dilated. Right Atrium: Right atrial size was moderately dilated. Pericardium: A small pericardial effusion is present. Mitral Valve: The mitral valve is grossly normal. Mild to moderate mitral valve regurgitation. No evidence of mitral valve stenosis. Tricuspid Valve: The tricuspid valve is grossly normal. Tricuspid valve regurgitation is mild to moderate. No evidence of tricuspid stenosis. Aortic Valve: The aortic valve was not well visualized. Aortic valve regurgitation is not visualized. No aortic stenosis is present. Pulmonic Valve: The pulmonic valve was not well visualized. Pulmonic valve regurgitation is not visualized. No evidence of pulmonic stenosis. Aorta: The aortic root is normal in size and structure and aortic dilatation noted. There is mild dilatation of the ascending aorta, measuring 41 mm. Venous: The inferior vena cava is dilated in size with less than 50% respiratory variability, suggesting right atrial pressure of 15 mmHg. IAS/Shunts: The atrial septum is grossly normal.  LEFT VENTRICLE PLAX 2D LVIDd:         6.60 cm      Diastology LVIDs:         6.10 cm      LV e' medial:    6.09 cm/s LV PW:          1.40 cm      LV E/e' medial:  21.3 LV IVS:        1.20 cm      LV e' lateral:   7.29 cm/s LVOT diam:     2.30 cm      LV E/e' lateral: 17.8 LV SV:         41 LV SV Index:   20 LVOT Area:     4.15 cm  LV Volumes (MOD) LV vol d, MOD A2C: 293.0 ml LV vol d, MOD A4C: 211.0 ml LV vol s, MOD A2C: 259.0 ml LV vol s, MOD A4C: 201.0 ml LV SV MOD A2C:     34.0 ml LV SV MOD A4C:     211.0 ml LV SV MOD BP:      25.1 ml RIGHT VENTRICLE RV S prime:     5.87 cm/s TAPSE (M-mode): 1.1 cm LEFT ATRIUM              Index       RIGHT ATRIUM           Index LA diam:        2.80 cm  1.36 cm/m  RA Area:  23.70 cm LA Vol (A2C):   92.9 ml  45.09 ml/m RA Volume:   89.80 ml  43.58 ml/m LA Vol (A4C):   117.0 ml 56.78 ml/m LA Biplane Vol: 107.0 ml 51.93 ml/m  AORTIC VALVE LVOT Vmax:   78.80 cm/s LVOT Vmean:  50.900 cm/s LVOT VTI:    0.099 m  AORTA Ao Root diam: 3.90 cm Ao Asc diam:  4.10 cm MITRAL VALVE                TRICUSPID VALVE MV Area (PHT): 5.73 cm     TR Peak grad:   39.7 mmHg MV Decel Time: 133 msec     TR Vmax:        315.00 cm/s MV E velocity: 129.50 cm/s                             SHUNTS                             Systemic VTI:  0.10 m                             Systemic Diam: 2.30 cm Riley Lam MD Electronically signed by Riley Lam MD Signature Date/Time: 07/16/2020/10:59:36 AM    Final (Updated)    VAS [place] LOWER EXTREMITY VENOUS (DVT)  Result Date: 07/16/2020  Lower Venous DVT Study Patient Name:  Caleb Landry  Date of Exam:   07/16/2020 Medical Rec #: [id number]     Accession #:    [id number] Date of Birth: [birth year]     Patient Gender: M Patient Age:   50Y Exam Location:  [facility] Procedure:      VAS [place] LOWER EXTREMITY VENOUS (DVT) Referring Phys: [id number] Eduard Clos --------------------------------------------------------------------------------  Indications: Edema.  Limitations: Poor ultrasound/tissue interface. Comparison Study: No previous exams Performing Technologist:  Jody Hill RVT, RDMS  Examination Guidelines: A complete evaluation includes B-mode imaging, spectral Doppler, color Doppler, and power Doppler as needed of all accessible portions of each vessel. Bilateral testing is considered an integral part of a complete examination. Limited examinations for reoccurring indications may be performed as noted. The reflux portion of the exam is performed with the patient in reverse Trendelenburg.  +---------+---------------+---------+-----------+----------+--------------+ RIGHT    CompressibilityPhasicitySpontaneityPropertiesThrombus Aging +---------+---------------+---------+-----------+----------+--------------+ CFV      Full           Yes      Yes                                 +---------+---------------+---------+-----------+----------+--------------+ SFJ      Full                                                        +---------+---------------+---------+-----------+----------+--------------+ FV Prox  Full           Yes      Yes                                 +---------+---------------+---------+-----------+----------+--------------+ FV Mid   Full  Yes      Yes                                 +---------+---------------+---------+-----------+----------+--------------+ FV DistalFull           Yes      Yes                                 +---------+---------------+---------+-----------+----------+--------------+ PFV      Full                                                        +---------+---------------+---------+-----------+----------+--------------+ POP      Full           Yes      Yes                                 +---------+---------------+---------+-----------+----------+--------------+ PTV      Full                                                        +---------+---------------+---------+-----------+----------+--------------+ PERO     Full                                                         +---------+---------------+---------+-----------+----------+--------------+   +---------+---------------+---------+-----------+----------+--------------+ LEFT     CompressibilityPhasicitySpontaneityPropertiesThrombus Aging +---------+---------------+---------+-----------+----------+--------------+ CFV      Full           Yes      Yes                                 +---------+---------------+---------+-----------+----------+--------------+ SFJ      Full                                                        +---------+---------------+---------+-----------+----------+--------------+ FV Prox  Full           Yes      Yes                                 +---------+---------------+---------+-----------+----------+--------------+ FV Mid   Full           Yes      Yes                                 +---------+---------------+---------+-----------+----------+--------------+ FV DistalFull           Yes      Yes                                 +---------+---------------+---------+-----------+----------+--------------+  PFV      Full                                                        +---------+---------------+---------+-----------+----------+--------------+ POP      Full           Yes      Yes                                 +---------+---------------+---------+-----------+----------+--------------+ PTV      Full                                                        +---------+---------------+---------+-----------+----------+--------------+ PERO     Full                                                        +---------+---------------+---------+-----------+----------+--------------+     Summary: BILATERAL: - No evidence of deep vein thrombosis seen in the lower extremities, bilaterally. - No evidence of superficial venous thrombosis in the lower extremities, bilaterally. -No evidence of popliteal cyst, bilaterally. -Subcutaneous edema of BLE.   LEFT: - Ultrasound characteristics of enlarged lymph nodes noted in the groin.  *See table(s) above for measurements and observations. Electronically signed by Sherald Hess MD on 07/16/2020 at 2:50:12 PM.    Final     Cardiac Studies  TTE 07/16/2020  1. Left ventricular ejection fraction, by estimation, is <20%. The left  ventricle has severely decreased function. The left ventricle demonstrates  global hypokinesis. The left ventricular internal cavity size was  moderately dilated. There is mild  concentric left ventricular hypertrophy. Left ventricular diastolic  parameters are consistent with Grade III diastolic dysfunction  (restrictive). Elevated left atrial pressure. Non Compaction Fraction <  2.3 my [name]. No LV thrombus.   2. Right ventricular systolic function is moderately reduced. The right  ventricular size is moderately enlarged. There is moderately elevated  pulmonary artery systolic pressure. The estimated right ventricular  systolic pressure is 54.7 mmHg.   3. Left atrial size was severely dilated.   4. Right atrial size was moderately dilated.   5. A small pericardial effusion is present.   6. The mitral valve is grossly normal. Mild to moderate mitral valve  regurgitation- mechanism appears to be ventricular functional. No evidence  of mitral stenosis.   7. Tricuspid valve regurgitation is mild to moderate- mechanisms appears  to be ventricular functional.   8. The aortic valve was not well visualized. Aortic valve regurgitation  is not visualized. No aortic stenosis is present.   9. Aortic dilatation noted. There is mild dilatation of the ascending  aorta, measuring 41 mm.  10. The inferior vena cava is dilated in size with <50% respiratory  variability, suggesting right atrial pressure of 15 mmHg.   Patient Profile  Caleb Landry is a 50 y.o. male with asthma, tobacco abuse, sarcoidosis admitted on 07/16/2020 with acute decompensated systolic  heart  failure.  Assessment & Plan   Acute systolic heart failure, EF 10 to 15% -He has severe biventricular failure.  There is mention of a history of sarcoidosis.  He has no significant conduction disease.  He has a dilated cardiomyopathy.  He has global hypokinesis with no regional wall motion abnormalities that would be indicative of advanced sarcoidosis.  He has no conduction disease that I would expect with advanced sarcoidosis if it involved his heart. -He has hyper trabeculation in the LV cavity but this is not consistent with LV noncompaction on my review.  No increased wall thickness to suggest amyloidosis. -Restrictive filling reported however he has EA fusion due to tachycardia.  He undoubtedly has elevated filling pressures but I do not believe we can call this restrictive. -He denies drug and alcohol abuse.  UDS is positive for amphetamines.  Unclear if this is drug-induced.  TSH is normal.  HIV negative. -Troponins are negative and flat.  EKG nonischemic.  I doubt this is an ischemic cardiomyopathy. -Overall this likely represents a dilated cardiomyopathy in setting of drug use if he does indeed use amphetamines versus something else.  Low suspicion for infiltrative cardiomyopathy.  No strong family history of this either.  He would benefit from cardiac MRI regardless.  Plan for left and right heart cath Monday. -For now remains grossly volume overloaded.  Nearly 6 L urine output with 40 IV twice daily of Lasix.  We will continue this. -Holding beta-blocker.  Cardiac output severely reduced on echo.  He has a narrow pulse pressure and is tachycardic.  We will not reduce this. -Add Aldactone 12.5 mg daily. -Jardiance 10 mg daily added. -Replete potassium as needed.  For questions or updates, please contact [facility] Please consult [email or web address] for contact info under   Time Spent with Patient: I have spent a total of 35 minutes with patient reviewing hospital notes, telemetry, EKGs,  labs and examining the patient as well as establishing an assessment and plan that was discussed with the patient.  > 50% of time was spent in direct patient care.    Signed, Lenna Gilford. Flora Lipps, MD, [facility]  [facility]  07/17/2020 8:57 AM

## 2020-07-18 DIAGNOSIS — I5021 Acute systolic (congestive) heart failure: Secondary | ICD-10-CM

## 2020-07-18 LAB — CBC WITH DIFFERENTIAL/PLATELET
Abs Immature Granulocytes: 0.02 10*3/uL (ref 0.00–0.07)
Basophils Absolute: 0.1 10*3/uL (ref 0.0–0.1)
Basophils Relative: 1 %
Eosinophils Absolute: 0.2 10*3/uL (ref 0.0–0.5)
Eosinophils Relative: 2 %
HCT: 36.4 % — ABNORMAL LOW (ref 39.0–52.0)
Hemoglobin: 11.8 g/dL — ABNORMAL LOW (ref 13.0–17.0)
Immature Granulocytes: 0 %
Lymphocytes Relative: 14 %
Lymphs Abs: 1.1 10*3/uL (ref 0.7–4.0)
MCH: 31.1 pg (ref 26.0–34.0)
MCHC: 32.4 g/dL (ref 30.0–36.0)
MCV: 95.8 fL (ref 80.0–100.0)
Monocytes Absolute: 0.8 10*3/uL (ref 0.1–1.0)
Monocytes Relative: 9 %
Neutro Abs: 6.1 10*3/uL (ref 1.7–7.7)
Neutrophils Relative %: 74 %
Platelets: 316 10*3/uL (ref 150–400)
RBC: 3.8 MIL/uL — ABNORMAL LOW (ref 4.22–5.81)
RDW: 14.3 % (ref 11.5–15.5)
WBC: 8.2 10*3/uL (ref 4.0–10.5)
nRBC: 0 % (ref 0.0–0.2)

## 2020-07-18 LAB — COMPREHENSIVE METABOLIC PANEL
ALT: 38 U/L (ref 0–44)
AST: 27 U/L (ref 15–41)
Albumin: 3.2 g/dL — ABNORMAL LOW (ref 3.5–5.0)
Alkaline Phosphatase: 69 U/L (ref 38–126)
Anion gap: 7 (ref 5–15)
BUN: 13 mg/dL (ref 6–20)
CO2: 36 mmol/L — ABNORMAL HIGH (ref 22–32)
Calcium: 8.6 mg/dL — ABNORMAL LOW (ref 8.9–10.3)
Chloride: 95 mmol/L — ABNORMAL LOW (ref 98–111)
Creatinine, Ser: 0.96 mg/dL (ref 0.61–1.24)
GFR, Estimated: >60 mL/min (ref >60)
Glucose, Bld: 120 mg/dL — ABNORMAL HIGH (ref 70–99)
Potassium: 4.1 mmol/L (ref 3.5–5.1)
Sodium: 138 mmol/L (ref 135–145)
Total Bilirubin: 0.7 mg/dL (ref 0.3–1.2)
Total Protein: 6.1 g/dL — ABNORMAL LOW (ref 6.5–8.1)

## 2020-07-18 LAB — GLUCOSE, CAPILLARY: Glucose-Capillary: 110 mg/dL — ABNORMAL HIGH (ref 70–99)

## 2020-07-18 LAB — PHOSPHORUS: Phosphorus: 3.7 mg/dL (ref 2.5–4.6)

## 2020-07-18 LAB — MAGNESIUM: Magnesium: 2.2 mg/dL (ref 1.7–2.4)

## 2020-07-18 MED ORDER — FUROSEMIDE 10 MG/ML IJ SOLN
40.0000 mg | Freq: Once | INTRAMUSCULAR | Status: AC
  Administered 2020-07-18: 40 mg via INTRAVENOUS
  Filled 2020-07-18: qty 4

## 2020-07-18 MED ORDER — ACETAZOLAMIDE 250 MG PO TABS
500.0000 mg | ORAL_TABLET | Freq: Once | ORAL | Status: AC
  Administered 2020-07-18: 500 mg via ORAL
  Filled 2020-07-18: qty 2

## 2020-07-18 NOTE — Progress Notes (Signed)
   07/17/20 2008  Assess: MEWS Score  Temp 98 F (36.7 C)  BP 105/70  Pulse Rate (!) 111  Resp 18  Level of Consciousness Alert  SpO2 91 %  O2 Device Room Air  Assess: MEWS Score  MEWS Temp 0  MEWS Systolic 0  MEWS Pulse 2  MEWS RR 0  MEWS LOC 0  MEWS Score 2  MEWS Score Color Yellow  Assess: if the MEWS score is Yellow or Red  Were vital signs taken at a resting state? Yes  Focused Assessment No change from prior assessment  Early Detection of Sepsis Score *See Row Information* Low  MEWS guidelines implemented *See Row Information* Yes  Treat  MEWS Interventions Administered scheduled meds/treatments  Pain Scale 0-10  Pain Score 0  Take Vital Signs  Increase Vital Sign Frequency  Yellow: Q 2hr X 2 then Q 4hr X 2, if remains yellow, continue Q 4hrs  Escalate  MEWS: Escalate Yellow: discuss with charge nurse/RN and consider discussing with provider and RRT  Notify: Charge Nurse/RN  Name of Charge Nurse/RN Notified Fred RN  Date Charge Nurse/RN Notified 07/17/20  Time Charge Nurse/RN Notified 2030  Document  Patient Outcome  (Stable, remains on unit)  Progress note created (see row info) Yes

## 2020-07-18 NOTE — Progress Notes (Signed)
Cardiology Progress Note  Patient ID: Caleb Landry MRN: [id number] DOB: [birth year] Date of Encounter: 07/18/2020  Primary Cardiologist: None  Subjective   Chief Complaint: Shortness of breath  HPI: Great diuresis again.  Net -5.9 L.  Net -12.2 L since admission.  Telemetry continues with sinus tachycardia.  Kidney function stable.  He is approaching euvolemia.  ROS:  All other ROS reviewed and negative. Pertinent positives noted in the HPI.     Inpatient Medications  Scheduled Meds:  acetaZOLAMIDE  500 mg Oral Once   digoxin  0.125 mg Oral Daily   doxycycline  100 mg Oral Q12H   empagliflozin  10 mg Oral Daily   enoxaparin (LOVENOX) injection  40 mg Subcutaneous Q24H   furosemide  40 mg Intravenous Once   magnesium oxide  400 mg Oral Daily   potassium chloride  40 mEq Oral Daily   sacubitril-valsartan  1 tablet Oral BID   sodium chloride flush  3 mL Intravenous Q12H   spironolactone  25 mg Oral Daily   Continuous Infusions:  sodium chloride     [START ON 07/19/2020] sodium chloride     PRN Meds: sodium chloride, acetaminophen, albuterol, sodium chloride flush   Vital Signs   Vitals:   07/17/20 2008 07/17/20 2158 07/17/20 2306 07/18/20 0419  BP: 105/70 109/70 100/60 110/64  Pulse: (!) 111 (!) 117 (!) 112 (!) 111  Resp: 18 17 20 17   Temp: 98 F (36.7 C)  98.3 F (36.8 C) 98.1 F (36.7 C)  TempSrc: Oral  Oral Oral  SpO2: 91% 92% 91% 90%  Weight:    79.2 kg  Height:        Intake/Output Summary (Last 24 hours) at 07/18/2020 0818 Last data filed at 07/18/2020 0420 Gross per 24 hour  Intake 714 ml  Output 6681 ml  Net -5967 ml   Last 3 Weights 07/18/2020 07/17/2020 07/16/2020  Weight (lbs) 174 lb 8 oz 186 lb 9.6 oz 187 lb 14.4 oz  Weight (kg) 79.153 kg 84.641 kg 85.231 kg      Telemetry  Overnight telemetry shows sinus tachycardia in the low 100s, which I personally reviewed.   Physical Exam   Vitals:   07/17/20 2008 07/17/20 2158 07/17/20 2306 07/18/20  0419  BP: 105/70 109/70 100/60 110/64  Pulse: (!) 111 (!) 117 (!) 112 (!) 111  Resp: 18 17 20 17   Temp: 98 F (36.7 C)  98.3 F (36.8 C) 98.1 F (36.7 C)  TempSrc: Oral  Oral Oral  SpO2: 91% 92% 91% 90%  Weight:    79.2 kg  Height:        Intake/Output Summary (Last 24 hours) at 07/18/2020 0818 Last data filed at 07/18/2020 0420 Gross per 24 hour  Intake 714 ml  Output 6681 ml  Net -5967 ml    Last 3 Weights 07/18/2020 07/17/2020 07/16/2020  Weight (lbs) 174 lb 8 oz 186 lb 9.6 oz 187 lb 14.4 oz  Weight (kg) 79.153 kg 84.641 kg 85.231 kg    Body mass index is 25.04 kg/m.   General: Well nourished, well developed, in no acute distress Head: Atraumatic, normal size  Eyes: PEERLA, EOMI  Neck: Supple, JVD 7 8 cm of water Endocrine: No thryomegaly Cardiac: Normal S1, S2; RRR; no murmurs, rubs, or gallops Lungs: Diffuse rhonchi and wheezing noted Abd: Soft, nontender, no hepatomegaly  Ext: Trace edema Musculoskeletal: No deformities, BUE and BLE strength normal and equal Skin: Warm and dry, no rashes   Neuro: Alert  and oriented to person, place, time, and situation, CNII-XII grossly intact, no focal deficits  Psych: Normal mood and affect   Labs  High Sensitivity Troponin:   Recent Labs  Lab 07/16/20 0222 07/16/20 0412  TROPONINIHS 36* 28*     Cardiac EnzymesNo results for input(s): TROPONINI in the last 168 hours. No results for input(s): TROPIPOC in the last 168 hours.  Chemistry Recent Labs  Lab 07/16/20 0412 07/16/20 0926 07/17/20 0341 07/18/20 0239  NA 137  --  135 138  K 3.3*  --  3.7 4.1  CL 104  --  95* 95*  CO2 25  --  33* 36*  GLUCOSE 93  --  103* 120*  BUN 24*  --  17 13  CREATININE 0.96 1.10 1.01 0.96  CALCIUM 7.6*  --  8.3* 8.6*  PROT 5.6*  --   --  6.1*  ALBUMIN 3.2*  --   --  3.2*  AST 39  --   --  27  ALT 42  --   --  38  ALKPHOS 67  --   --  69  BILITOT 0.8  --   --  0.7  GFRNONAA >60 >60 >60 >60  ANIONGAP 8  --  7 7    Hematology Recent  Labs  Lab 07/16/20 0926 07/17/20 0341 07/18/20 0239  WBC 10.6* 7.5 8.2  RBC 3.57* 3.61* 3.80*  HGB 11.1* 11.1* 11.8*  HCT 34.5* 34.3* 36.4*  MCV 96.6 95.0 95.8  MCH 31.1 30.7 31.1  MCHC 32.2 32.4 32.4  RDW 14.4 14.3 14.3  PLT 286 265 316   BNP Recent Labs  Lab 07/16/20 0223  BNP 564.0*    DDimer  Recent Labs  Lab 07/16/20 0813  DDIMER 1.25*     Radiology  CT Angio Chest Pulmonary Embolism (PE) W or WO Contrast  Result Date: 07/16/2020 CLINICAL DATA:  Leg swelling.  Shortness of breath. EXAM: CT ANGIOGRAPHY CHEST WITH CONTRAST TECHNIQUE: Multidetector CT imaging of the chest was performed using the standard protocol during bolus administration of intravenous contrast. Multiplanar CT image reconstructions and MIPs were obtained to evaluate the vascular anatomy. CONTRAST:  65mL OMNIPAQUE IOHEXOL 350 MG/ML SOLN COMPARISON:  None. FINDINGS: Cardiovascular: No filling defects in the pulmonary arteries to suggest pulmonary emboli. Heart is mildly enlarged. Aorta normal caliber. Mediastinum/Nodes: No mediastinal, hilar, or axillary adenopathy. Trachea and esophagus are unremarkable. Thyroid unremarkable. Lungs/Pleura: Biapical pulmonary nodules measuring up to 10 mm in the left apex and 9 mm in the right apex. Areas of apical and bibasilar scarring. Trace right pleural effusion. Upper Abdomen: Imaging into the upper abdomen demonstrates no acute findings. Musculoskeletal: Chest wall soft tissues are unremarkable. No acute bony abnormality. Review of the MIP images confirms the above findings. IMPRESSION: No evidence of pulmonary embolus. Cardiomegaly. Biapical and bibasilar scarring. Nodular densities in the upper lobes/apices bilaterally warrant follow-up. Non-contrast chest CT at 3-6 months is recommended. If the nodules are stable at time of repeat CT, then future CT at 18-24 months (from today's scan) is considered optional for low-risk patients, but is recommended for high-risk patients.  This recommendation follows the consensus statement: Guidelines for Management of Incidental Pulmonary Nodules Detected on CT Images: From the Fleischner Society 2017; Radiology 2017; 284:228-243. Trace right pleural effusion. Electronically Signed   By: Charlett Nose M.D.   On: 07/16/2020 11:38   ECHOCARDIOGRAM COMPLETE  Result Date: 07/16/2020    ECHOCARDIOGRAM REPORT   Patient Name:   Caleb Landry Date  of Exam: 07/16/2020 Medical Rec #:  [id number]    Height:       70.0 in Accession #:    [id number]   Weight:       194.0 lb Date of Birth:  12-25-1970    BSA:          2.060 m Patient Age:    50 years     BP:           119/90 mmHg Patient Gender: M            HR:           105 bpm. Exam Location:  Inpatient Procedure: 2D Echo, 3D Echo, Cardiac Doppler and Color Doppler REPORT CONTAINS CRITICAL RESULT                                 MODIFIED REPORT:     This report was modified by Riley Lam MD on 07/16/2020 due to                                Additional images.  Indications:     I50.40* Unspecified combined systolic (congestive) and                  diastolic (congestive) heart failure  History:         Patient has no prior history of Echocardiogram examinations.                  CHF, Signs/Symptoms:Shortness of Breath and Dyspnea; Risk                  Factors:Current Smoker.  Sonographer:     Sheralyn Boatman RDCS Referring Phys:  [id number] Meryle Ready [facility] Diagnosing Phys: Riley Lam MD  Sonographer Comments: Suboptimal parasternal window and suboptimal subcostal window. Patient in high fowler's position due to dyspnea. IMPRESSIONS  1. Left ventricular ejection fraction, by estimation, is <20%. The left ventricle has severely decreased function. The left ventricle demonstrates global hypokinesis. The left ventricular internal cavity size was moderately dilated. There is mild concentric left ventricular hypertrophy. Left ventricular diastolic parameters are consistent with Grade III diastolic  dysfunction (restrictive). Elevated left atrial pressure. Non Compaction Fraction < 2.3 my [name]. No LV thrombus.  2. Right ventricular systolic function is moderately reduced. The right ventricular size is moderately enlarged. There is moderately elevated pulmonary artery systolic pressure. The estimated right ventricular systolic pressure is 54.7 mmHg.  3. Left atrial size was severely dilated.  4. Right atrial size was moderately dilated.  5. A small pericardial effusion is present.  6. The mitral valve is grossly normal. Mild to moderate mitral valve regurgitation- mechanism appears to be ventricular functional. No evidence of mitral stenosis.  7. Tricuspid valve regurgitation is mild to moderate- mechanisms appears to be ventricular functional.  8. The aortic valve was not well visualized. Aortic valve regurgitation is not visualized. No aortic stenosis is present.  9. Aortic dilatation noted. There is mild dilatation of the ascending aorta, measuring 41 mm. 10. The inferior vena cava is dilated in size with <50% respiratory variability, suggesting right atrial pressure of 15 mmHg. Comparison(s): No prior Echocardiogram. Conclusion(s)/Recommendation(s): New Biventricular dysfunction. Discussed with primary MD. Constrasted images do not meet criteria for LV non-compaction or show LV thrombus. CMR could be considered in the future if clinically indicated. FINDINGS  Left Ventricle: Left ventricular ejection fraction, by estimation, is <20%. The left ventricle has severely decreased function. The left ventricle demonstrates global hypokinesis. The left ventricular internal cavity size was moderately dilated. There is mild concentric left ventricular hypertrophy. Left ventricular diastolic parameters are consistent with Grade III diastolic dysfunction (restrictive). Elevated left atrial pressure. Right Ventricle: The right ventricular size is moderately enlarged. Right vetricular wall thickness was not  well visualized. Right ventricular systolic function is moderately reduced. There is moderately elevated pulmonary artery systolic pressure. The tricuspid regurgitant velocity is 3.15 m/s, and with an assumed right atrial pressure of 15 mmHg, the estimated right ventricular systolic pressure is 54.7 mmHg. Left Atrium: Left atrial size was severely dilated. Right Atrium: Right atrial size was moderately dilated. Pericardium: A small pericardial effusion is present. Mitral Valve: The mitral valve is grossly normal. Mild to moderate mitral valve regurgitation. No evidence of mitral valve stenosis. Tricuspid Valve: The tricuspid valve is grossly normal. Tricuspid valve regurgitation is mild to moderate. No evidence of tricuspid stenosis. Aortic Valve: The aortic valve was not well visualized. Aortic valve regurgitation is not visualized. No aortic stenosis is present. Pulmonic Valve: The pulmonic valve was not well visualized. Pulmonic valve regurgitation is not visualized. No evidence of pulmonic stenosis. Aorta: The aortic root is normal in size and structure and aortic dilatation noted. There is mild dilatation of the ascending aorta, measuring 41 mm. Venous: The inferior vena cava is dilated in size with less than 50% respiratory variability, suggesting right atrial pressure of 15 mmHg. IAS/Shunts: The atrial septum is grossly normal.  LEFT VENTRICLE PLAX 2D LVIDd:         6.60 cm      Diastology LVIDs:         6.10 cm      LV e' medial:    6.09 cm/s LV PW:         1.40 cm      LV E/e' medial:  21.3 LV IVS:        1.20 cm      LV e' lateral:   7.29 cm/s LVOT diam:     2.30 cm      LV E/e' lateral: 17.8 LV SV:         41 LV SV Index:   20 LVOT Area:     4.15 cm  LV Volumes (MOD) LV vol d, MOD A2C: 293.0 ml LV vol d, MOD A4C: 211.0 ml LV vol s, MOD A2C: 259.0 ml LV vol s, MOD A4C: 201.0 ml LV SV MOD A2C:     34.0 ml LV SV MOD A4C:     211.0 ml LV SV MOD BP:      25.1 ml RIGHT VENTRICLE RV S prime:     5.87 cm/s TAPSE  (M-mode): 1.1 cm LEFT ATRIUM              Index       RIGHT ATRIUM           Index LA diam:        2.80 cm  1.36 cm/m  RA Area:     23.70 cm LA Vol (A2C):   92.9 ml  45.09 ml/m RA Volume:   89.80 ml  43.58 ml/m LA Vol (A4C):   117.0 ml 56.78 ml/m LA Biplane Vol: 107.0 ml 51.93 ml/m  AORTIC VALVE LVOT Vmax:   78.80 cm/s LVOT Vmean:  50.900 cm/s LVOT VTI:    0.099 m  AORTA Ao  Root diam: 3.90 cm Ao Asc diam:  4.10 cm MITRAL VALVE                TRICUSPID VALVE MV Area (PHT): 5.73 cm     TR Peak grad:   39.7 mmHg MV Decel Time: 133 msec     TR Vmax:        315.00 cm/s MV E velocity: 129.50 cm/s                             SHUNTS                             Systemic VTI:  0.10 m                             Systemic Diam: 2.30 cm Riley Lam MD Electronically signed by Riley Lam MD Signature Date/Time: 07/16/2020/10:59:36 AM    Final (Updated)    VAS [place] LOWER EXTREMITY VENOUS (DVT)  Result Date: 07/16/2020  Lower Venous DVT Study Patient Name:  Caleb Landry  Date of Exam:   07/16/2020 Medical Rec #: [id number]     Accession #:    [id number] Date of Birth: 12/06/1970     Patient Gender: M Patient Age:   50Y Exam Location:  [facility] Procedure:      VAS [place] LOWER EXTREMITY VENOUS (DVT) Referring Phys: [id number] Eduard Clos --------------------------------------------------------------------------------  Indications: Edema.  Limitations: Poor ultrasound/tissue interface. Comparison Study: No previous exams Performing Technologist: Jody Hill RVT, RDMS  Examination Guidelines: A complete evaluation includes B-mode imaging, spectral Doppler, color Doppler, and power Doppler as needed of all accessible portions of each vessel. Bilateral testing is considered an integral part of a complete examination. Limited examinations for reoccurring indications may be performed as noted. The reflux portion of the exam is performed with the patient in reverse Trendelenburg.   +---------+---------------+---------+-----------+----------+--------------+ RIGHT    CompressibilityPhasicitySpontaneityPropertiesThrombus Aging +---------+---------------+---------+-----------+----------+--------------+ CFV      Full           Yes      Yes                                 +---------+---------------+---------+-----------+----------+--------------+ SFJ      Full                                                        +---------+---------------+---------+-----------+----------+--------------+ FV Prox  Full           Yes      Yes                                 +---------+---------------+---------+-----------+----------+--------------+ FV Mid   Full           Yes      Yes                                 +---------+---------------+---------+-----------+----------+--------------+ FV DistalFull           Yes  Yes                                 +---------+---------------+---------+-----------+----------+--------------+ PFV      Full                                                        +---------+---------------+---------+-----------+----------+--------------+ POP      Full           Yes      Yes                                 +---------+---------------+---------+-----------+----------+--------------+ PTV      Full                                                        +---------+---------------+---------+-----------+----------+--------------+ PERO     Full                                                        +---------+---------------+---------+-----------+----------+--------------+   +---------+---------------+---------+-----------+----------+--------------+ LEFT     CompressibilityPhasicitySpontaneityPropertiesThrombus Aging +---------+---------------+---------+-----------+----------+--------------+ CFV      Full           Yes      Yes                                  +---------+---------------+---------+-----------+----------+--------------+ SFJ      Full                                                        +---------+---------------+---------+-----------+----------+--------------+ FV Prox  Full           Yes      Yes                                 +---------+---------------+---------+-----------+----------+--------------+ FV Mid   Full           Yes      Yes                                 +---------+---------------+---------+-----------+----------+--------------+ FV DistalFull           Yes      Yes                                 +---------+---------------+---------+-----------+----------+--------------+ PFV      Full                                                        +---------+---------------+---------+-----------+----------+--------------+  POP      Full           Yes      Yes                                 +---------+---------------+---------+-----------+----------+--------------+ PTV      Full                                                        +---------+---------------+---------+-----------+----------+--------------+ PERO     Full                                                        +---------+---------------+---------+-----------+----------+--------------+     Summary: BILATERAL: - No evidence of deep vein thrombosis seen in the lower extremities, bilaterally. - No evidence of superficial venous thrombosis in the lower extremities, bilaterally. -No evidence of popliteal cyst, bilaterally. -Subcutaneous edema of BLE.  LEFT: - Ultrasound characteristics of enlarged lymph nodes noted in the groin.  *See table(s) above for measurements and observations. Electronically signed by Sherald Hess MD on 07/16/2020 at 2:50:12 PM.    Final     Cardiac Studies  TTE 07/16/2020  1. Left ventricular ejection fraction, by estimation, is <20%. The left  ventricle has severely decreased function. The left  ventricle demonstrates  global hypokinesis. The left ventricular internal cavity size was  moderately dilated. There is mild  concentric left ventricular hypertrophy. Left ventricular diastolic  parameters are consistent with Grade III diastolic dysfunction  (restrictive). Elevated left atrial pressure. Non Compaction Fraction <  2.3 my [name]. No LV thrombus.   2. Right ventricular systolic function is moderately reduced. The right  ventricular size is moderately enlarged. There is moderately elevated  pulmonary artery systolic pressure. The estimated right ventricular  systolic pressure is 54.7 mmHg.   3. Left atrial size was severely dilated.   4. Right atrial size was moderately dilated.   5. A small pericardial effusion is present.   6. The mitral valve is grossly normal. Mild to moderate mitral valve  regurgitation- mechanism appears to be ventricular functional. No evidence  of mitral stenosis.   7. Tricuspid valve regurgitation is mild to moderate- mechanisms appears  to be ventricular functional.   8. The aortic valve was not well visualized. Aortic valve regurgitation  is not visualized. No aortic stenosis is present.   9. Aortic dilatation noted. There is mild dilatation of the ascending  aorta, measuring 41 mm.  10. The inferior vena cava is dilated in size with <50% respiratory  variability, suggesting right atrial pressure of 15 mmHg.   Patient Profile  Caleb Landry is a 50 y.o. male with asthma, tobacco abuse, sarcoidosis admitted on 07/16/2020 with acute decompensated systolic heart failure.  Assessment & Plan   Acute systolic heart failure, EF 10 to 15% -Admitted with new onset severe biventricular failure.  History of sarcoidosis but no conduction disease.  I do not believe this is sarcoid related.  He has global hypokinesis.  EKG is nonischemic.  Troponins are negative and flat.  I do not suspect ischemic etiology.  He does not have restrictive filling.   Tachycardia precludes EDA assessment.  No increased wall thickness to suggest infiltrative disorder such as amyloidosis.  UDS is positive for amphetamines but apparently he uses stimulants.  Unclear if this is drug-induced.  TSH normal.  HIV normal.  There is some hyper trabeculation in the LV cavity but on my review this is not consistent with LV noncompaction. -He is approaching euvolemia.  We will give 1 more dose of 40 mg IV Lasix today.  He does not need a second dose. -Continue Entresto 24-26 mg twice daily. -No beta-blocker given tachycardia. -On digoxin 0.125 mg daily. -On Aldactone 25 mg daily. -Jardiance 10 mg daily added -I have given him 1 dose of Diamox given his rising bicarbonate value. -Plans for left and right heart cath tomorrow.  N.p.o. at midnight. -Plans also for cardiac MRI this admission after left and right heart cath to evaluate his cardiomyopathy.   For questions or updates, please contact [facility] Please consult [email or web address] for contact info under   Time Spent with Patient: I have spent a total of 35 minutes with patient reviewing hospital notes, telemetry, EKGs, labs and examining the patient as well as establishing an assessment and plan that was discussed with the patient.  > 50% of time was spent in direct patient care.    Signed, Lenna Gilford. Flora Lipps, MD, [facility] [place]  [facility]  07/18/2020 8:18 AM

## 2020-07-18 NOTE — Progress Notes (Addendum)
Advanced Heart Failure Rounding Note   Subjective:    Diuresing well. Weight down 20 pounds from admit. Feels better. Denies CP or SOB. Renal function stable.  Objective:   Weight Range:  Vital Signs:   Temp:  [98 F (36.7 C)-98.4 F (36.9 C)] 98.4 F (36.9 C) (07/17 1000) Pulse Rate:  [101-117] 106 (07/17 1000) Resp:  [17-20] 17 (07/17 0419) BP: (100-110)/(60-71) 104/69 (07/17 1000) SpO2:  [90 %-96 %] 96 % (07/17 1000) Weight:  [79.2 kg] 79.2 kg (07/17 0419) Last BM Date: 07/17/20  Weight change: Filed Weights   07/16/20 1806 07/17/20 0322 07/18/20 0419  Weight: 85.2 kg 84.6 kg 79.2 kg    Intake/Output:   Intake/Output Summary (Last 24 hours) at 07/18/2020 1151 Last data filed at 07/18/2020 1018 Gross per 24 hour  Intake 354 ml  Output 7290 ml  Net -6936 ml     Physical Exam: General:  sitting up in bed No resp difficulty HEENT: normal Neck: supple. JVP 8. Carotids 2+ bilat; no bruits. No lymphadenopathy or thryomegaly appreciated. Cor: PMI nondisplaced. Regular tachy  No rubs, gallops or murmurs. Lungs: clear Abdomen: soft, nontender, nondistended. No hepatosplenomegaly. No bruits or masses. Good bowel sounds. Extremities: no cyanosis, clubbing, rash, trace edema Neuro: alert & orientedx3, cranial nerves grossly intact. moves all 4 extremities w/o difficulty. Affect pleasant  Telemetry: Sinus 100-115 Personally reviewed  Labs: Basic Metabolic Panel: Recent Labs  Lab 07/16/20 0412 07/16/20 0926 07/17/20 0341 07/18/20 0239  NA 137  --  135 138  K 3.3*  --  3.7 4.1  CL 104  --  95* 95*  CO2 25  --  33* 36*  GLUCOSE 93  --  103* 120*  BUN 24*  --  17 13  CREATININE 0.96 1.10 1.01 0.96  CALCIUM 7.6*  --  8.3* 8.6*  MG  --   --  1.8 2.2  PHOS  --   --   --  3.7    Liver Function Tests: Recent Labs  Lab 07/16/20 0412 07/18/20 0239  AST 39 27  ALT 42 38  ALKPHOS 67 69  BILITOT 0.8 0.7  PROT 5.6* 6.1*  ALBUMIN 3.2* 3.2*   No results for  input(s): LIPASE, AMYLASE in the last 168 hours. No results for input(s): AMMONIA in the last 168 hours.  CBC: Recent Labs  Lab 07/16/20 0213 07/16/20 0926 07/17/20 0341 07/18/20 0239  WBC 13.3* 10.6* 7.5 8.2  NEUTROABS  --   --   --  6.1  HGB 12.1* 11.1* 11.1* 11.8*  HCT 36.8* 34.5* 34.3* 36.4*  MCV 96.6 96.6 95.0 95.8  PLT 333 286 265 316    Cardiac Enzymes: No results for input(s): CKTOTAL, CKMB, CKMBINDEX, TROPONINI in the last 168 hours.  BNP: BNP (last 3 results) Recent Labs    07/16/20 0223  BNP 564.0*    ProBNP (last 3 results) No results for input(s): PROBNP in the last 8760 hours.    Other results:  Imaging: VAS US LOWER EXTREMITY VENOUS (DVT)  Result Date: 07/16/2020  Lower Venous DVT Study Patient Name:  Caleb Landry  Date of Exam:   07/16/2020 Medical Rec #: [id number]     Accession #:    [id number] Date of Birth: May 27, 1970     Patient Gender: M Patient Age:   50Y [facility] Procedure:      VAS US LOWER EXTREMITY VENOUS (DVT) Referring Phys: [id number] Eduard ClosARSHAD N KAKRAKANDY --------------------------------------------------------------------------------  Indications: Edema.  Limitations: Poor ultrasound/tissue interface. Comparison Study: No previous exams Performing Technologist: Jody Hill RVT, RDMS  Examination Guidelines: A complete evaluation includes B-mode imaging, spectral Doppler, color Doppler, and power Doppler as needed of all accessible portions of each vessel. Bilateral testing is considered an integral part of a complete examination. Limited examinations for reoccurring indications may be performed as noted. The reflux portion of the exam is performed with the patient in reverse Trendelenburg.  +---------+---------------+---------+-----------+----------+--------------+ RIGHT    CompressibilityPhasicitySpontaneityPropertiesThrombus Aging +---------+---------------+---------+-----------+----------+--------------+ CFV      Full            Yes      Yes                                 +---------+---------------+---------+-----------+----------+--------------+ SFJ      Full                                                        +---------+---------------+---------+-----------+----------+--------------+ FV Prox  Full           Yes      Yes                                 +---------+---------------+---------+-----------+----------+--------------+ FV Mid   Full           Yes      Yes                                 +---------+---------------+---------+-----------+----------+--------------+ FV DistalFull           Yes      Yes                                 +---------+---------------+---------+-----------+----------+--------------+ PFV      Full                                                        +---------+---------------+---------+-----------+----------+--------------+ POP      Full           Yes      Yes                                 +---------+---------------+---------+-----------+----------+--------------+ PTV      Full                                                        +---------+---------------+---------+-----------+----------+--------------+ PERO     Full                                                        +---------+---------------+---------+-----------+----------+--------------+   +---------+---------------+---------+-----------+----------+--------------+  LEFT     CompressibilityPhasicitySpontaneityPropertiesThrombus Aging +---------+---------------+---------+-----------+----------+--------------+ CFV      Full           Yes      Yes                                 +---------+---------------+---------+-----------+----------+--------------+ SFJ      Full                                                        +---------+---------------+---------+-----------+----------+--------------+ FV Prox  Full           Yes      Yes                                  +---------+---------------+---------+-----------+----------+--------------+ FV Mid   Full           Yes      Yes                                 +---------+---------------+---------+-----------+----------+--------------+ FV DistalFull           Yes      Yes                                 +---------+---------------+---------+-----------+----------+--------------+ PFV      Full                                                        +---------+---------------+---------+-----------+----------+--------------+ POP      Full           Yes      Yes                                 +---------+---------------+---------+-----------+----------+--------------+ PTV      Full                                                        +---------+---------------+---------+-----------+----------+--------------+ PERO     Full                                                        +---------+---------------+---------+-----------+----------+--------------+     Summary: BILATERAL: - No evidence of deep vein thrombosis seen in the lower extremities, bilaterally. - No evidence of superficial venous thrombosis in the lower extremities, bilaterally. -No evidence of popliteal cyst, bilaterally. -Subcutaneous edema of BLE.  LEFT: - Ultrasound characteristics of enlarged lymph nodes noted in the groin.  *See table(s) above for measurements and observations. Electronically signed by  Sherald Hess MD on 07/16/2020 at 2:50:12 PM.    Final      Medications:     Scheduled Medications:  digoxin  0.125 mg Oral Daily   doxycycline  100 mg Oral Q12H   empagliflozin  10 mg Oral Daily   enoxaparin (LOVENOX) injection  40 mg Subcutaneous Q24H   magnesium oxide  400 mg Oral Daily   potassium chloride  40 mEq Oral Daily   sacubitril-valsartan  1 tablet Oral BID   sodium chloride flush  3 mL Intravenous Q12H   spironolactone  25 mg Oral Daily    Infusions:  sodium chloride     [START ON  07/19/2020] sodium chloride      PRN Medications: sodium chloride, acetaminophen, albuterol, sodium chloride flush   Assessment/Plan   1. Acute systolic HF - Echo 07/16/20 EF < 20% RV moderately down mild to mod MR/TR.  - Suspect NICM possibly due to OTC stimulants (he does not use meth). There is some hyper trabeculation in the LV cavity but not felt to be consistent with LV noncompaction. - Volume status much improved. Continue diuresis as per Dr. Flora Lipps  - Continue Entresto 24-26 mg twice daily. - No beta-blocker given acute decompensation - Continue digoxin 0.125 mg daily. - Continue spiro 25 mg daily. - Continue Jardiance 10 mg daily - R/L cath tomorrow followed by cMRI   2. Left leg erythema - Concern for cellulitis -- started on doxycycline by primary team - [place] negative for DVT - improved   3. Tobacco use - cessation encouraged   4. SDOH Needs - very concerned about ability to afford meds - will have PharmD help with assessing needs and providing assistance.  Length of Stay: 2   Arvilla Meres MD 07/18/2020, 11:51 AM  Advanced Heart Failure Team Pager [phone] (M-F; 7a - 4p)  Please contact [facility] for night-coverage after hours (4p -7a ) and weekends on [email or web address]

## 2020-07-18 NOTE — Plan of Care (Signed)

## 2020-07-18 NOTE — Progress Notes (Signed)
PROGRESS NOTE    Caleb Landry  RJJ:[id number] DOB: July 28, 1970 DOA: 07/16/2020 PCP: Pcp, No   Chief Complaint  Patient presents with   Leg Swelling   Shortness of Breath    Brief Narrative: [name] is Madora Landry 50 y.o. male with history of asthma, tobacco abuse presents to the ER because of worsening swelling of the lower extremities.  Patient states he noticed the swelling on the left lower extremity over the last 1 month and the right lower extremity started swelling over the last few days which has progressed very quick over the last few days involving his scrotum at this time.  Over the last few days he also has been having shortness of breath exertional and orthopnea.  Denies any chest pain productive cough fever or chills.  Patient states he used to be on albuterol for asthma which he is off at this time since he moved to [place] over [place] year ago from [place].   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  Holding off on beta blocker with acute decompensation.  Digoxin.  Jardiance.  K>4, mg>2 Strict I/O, daily weights Troponin elevated, mild and flat - likely demand Cardiology c/s, appreciate recs - plan for [facility] 7/18.  SPEP/UPEP, considering cardiac MRI inpatient.  Denies any illicit drug use - notes use of OTC supplements -> "stackers", "jet alert", "mini thin"  History of Asthma Continue albuterol prn wheezing  Lower Extremity Swelling  Presumed Left Lower Extremity Cellulitis Continue doxycycline Enlarged LN's in Groin on Left - Noted, possibly 2/2 cellulitis - follow   Tobacco  Abuse Encourage cessation   Normocytic Anemia Follow anemia labs  DVT prophylaxis: lovenox Code Status: full  Family Communication: none at bedside Disposition:   Status is: Inpatient  Remains inpatient appropriate because:Inpatient level of care appropriate due to severity of illness  Dispo: The patient is from: Home              Anticipated d/c is to: Home              Patient currently is not medically stable to d/c.   Difficult to place patient No       Consultants:  cardiology  Procedures:  Echo IMPRESSIONS     1. Left ventricular ejection fraction, by estimation, is <20%. The left  ventricle has severely decreased function. The left ventricle demonstrates  global hypokinesis. The left ventricular internal cavity size was  moderately dilated. There is mild  concentric left ventricular hypertrophy. Left ventricular diastolic  parameters are consistent with Grade III diastolic dysfunction  (restrictive). Elevated left atrial pressure. Non Compaction Fraction <  2.3 my [name]. No LV thrombus.   2. Right ventricular systolic function is moderately reduced. The right  ventricular size is moderately enlarged. There is moderately elevated  pulmonary artery systolic pressure. The estimated right ventricular  systolic pressure is 54.7 mmHg.   3. Left atrial size was severely dilated.   4. Right atrial size was moderately dilated.   5. [name] small pericardial effusion is present.   6. The mitral valve is grossly normal. Mild to moderate mitral  valve  regurgitation- mechanism appears to be ventricular functional. No evidence  of mitral stenosis.   7. Tricuspid valve regurgitation is mild to moderate- mechanisms appears  to be ventricular functional.   8. The aortic valve was not well visualized. Aortic valve regurgitation  is not visualized. No aortic stenosis is present.   9. Aortic dilatation noted. There is mild dilatation of the ascending  aorta,  measuring 41 mm.  10. The inferior vena cava is dilated in size with <50% respiratory  variability, suggesting right atrial pressure of 15 mmHg.   Comparison(s): No prior Echocardiogram.   Conclusion(s)/Recommendation(s): New Biventricular dysfunction. Discussed  with primary MD. Constrasted images do not meet criteria for LV  non-compaction or show LV thrombus. CMR could be considered in the future  if clinically indicated.   LE [place] Summary:  BILATERAL:  - No evidence of deep vein thrombosis seen in the lower extremities,  bilaterally.  - No evidence of superficial venous thrombosis in the lower extremities,  bilaterally.  -No evidence of popliteal cyst, bilaterally.  -Subcutaneous edema of BLE.     LEFT:  - Ultrasound characteristics of enlarged lymph nodes noted in the groin  Antimicrobials: Anti-infectives (From admission, onward)    Start     Dose/Rate Route Frequency Ordered Stop   07/16/20 1000  doxycycline (VIBRA-TABS) tablet 100 mg        100 mg Oral Every 12 hours 07/16/20 0903 07/21/20 0959   07/16/20 0845  clindamycin (CLEOCIN) IVPB 600 mg  Status:  Discontinued        600 mg 100 mL/hr over 30 Minutes Intravenous  Once 07/16/20 0843 07/16/20 0902          Subjective: No new complaints  Objective: Vitals:   07/17/20 2158 07/17/20 2306 07/18/20 0419 07/18/20 1000  BP: 109/70 100/60 110/64 104/69  Pulse: (!) 117 (!) 112 (!) 111 (!) 106  Resp: 17 20 17    Temp:  98.3 F (36.8 C) 98.1 F (36.7 C) 98.4 F (36.9 C)  TempSrc:  Oral Oral Oral  SpO2: 92% 91% 90% 96%  Weight:   79.2 kg   Height:        Intake/Output Summary (Last 24 hours) at 07/18/2020 1732 Last data filed at 07/18/2020 1701 Gross per 24 hour  Intake 236 ml  Output 7690 ml  Net -7454 ml   Filed Weights   07/16/20 1806 07/17/20 0322 07/18/20 0419  Weight: 85.2 kg 84.6 kg 79.2 kg    Examination: General: No acute distress. Cardiovascular: RRR Lungs:unlabored Abdomen: Soft,  nontender, nondistended  Neurological: Alert and oriented 3. Moves all extremities 4 . Cranial nerves II through XII grossly intact. Skin: Warm and dry. No rashes or lesions. Extremities: No clubbing or cyanosis. Trace edema   Data Reviewed: I have personally reviewed following labs and imaging studies  CBC: Recent Labs  Lab 07/16/20 0213 07/16/20 0926 07/17/20 0341 07/18/20 0239  WBC 13.3* 10.6* 7.5 8.2  NEUTROABS  --   --   --  6.1  HGB 12.1* 11.1* 11.1* 11.8*  HCT 36.8* 34.5* 34.3* 36.4*  MCV 96.6 96.6 95.0 95.8  PLT 333 286 265 316    Basic Metabolic Panel: Recent Labs  Lab 07/16/20 0412 07/16/20 0926 07/17/20 0341 07/18/20 0239  NA 137  --  135 138  K 3.3*  --  3.7 4.1  CL 104  --  95* 95*  CO2 25  --  33* 36*  GLUCOSE 93  --  103* 120*  BUN 24*  --  17 13  CREATININE 0.96 1.10 1.01 0.96  CALCIUM 7.6*  --  8.3* 8.6*  MG  --   --  1.8 2.2  PHOS  --   --   --  3.7    GFR: Estimated Creatinine Clearance: 96.1 mL/min (by C-G formula based on SCr of 0.96 mg/dL).  Liver Function Tests: Recent Labs  Lab 07/16/20 0412 07/18/20 0239  AST 39 27  ALT 42 38  ALKPHOS 67 69  BILITOT 0.8 0.7  PROT 5.6* 6.1*  ALBUMIN 3.2* 3.2*    CBG: No results for input(s): GLUCAP in the last 168 hours.   Recent Results (from the past 240 hour(s))  Resp Panel by RT-PCR (Flu Konnie Noffsinger&B, Covid) Nasopharyngeal Swab     Status: None   Collection Time: 07/16/20  2:23 AM   Specimen: Nasopharyngeal Swab; Nasopharyngeal(NP) swabs in vial transport medium  Result Value Ref Range Status   SARS Coronavirus 2 by RT PCR NEGATIVE NEGATIVE Final    Comment: (NOTE) SARS-CoV-2 target nucleic acids are NOT DETECTED.  The SARS-CoV-2 RNA is generally detectable in upper respiratory specimens during the acute phase of infection. The lowest concentration of SARS-CoV-2 viral copies this assay can detect is 138 copies/mL. Jovanne Riggenbach negative result does not preclude SARS-Cov-2 infection and should not be  used as the sole basis for treatment or other patient management decisions. Jazon Jipson negative result may occur with  improper specimen collection/handling, submission of specimen other than nasopharyngeal swab, presence of viral mutation(s) within the areas targeted by this assay, and inadequate number of viral copies(<138 copies/mL). Vergie Zahm negative result must be combined with clinical observations, patient history, and epidemiological information. The expected result is Negative.  Fact Sheet for Patients:  BloggerCourse.com  Fact Sheet for Healthcare Providers:  SeriousBroker.it  This test is no t yet approved or cleared by the [place] FDA and  has been authorized for detection and/or diagnosis of SARS-CoV-2 by FDA under an Emergency Use Authorization (EUA). This EUA will remain  in effect (meaning this test can be used) for the duration of the COVID-19 declaration under Section 564(b)(1) of the Act, 21 U.S.C.section 360bbb-3(b)(1), unless the authorization is terminated  or revoked sooner.       Influenza Nioma Mccubbins by PCR NEGATIVE NEGATIVE Final   Influenza B by PCR NEGATIVE NEGATIVE Final    Comment: (NOTE) The Xpert Xpress SARS-CoV-2/FLU/RSV plus assay is intended as an aid in the diagnosis of influenza from Nasopharyngeal swab specimens and should not be used as Geralda Baumgardner sole basis for treatment. Nasal washings and aspirates are unacceptable for Xpert Xpress SARS-CoV-2/FLU/RSV testing.  Fact Sheet for Patients: BloggerCourse.com  Fact Sheet for Healthcare Providers: SeriousBroker.it  This test is not yet approved or cleared by the [place] FDA and has been authorized for detection and/or diagnosis of SARS-CoV-2 by FDA under an Emergency Use Authorization (EUA). This EUA will remain in effect (meaning this test can be used) for the duration of the COVID-19 declaration under Section  564(b)(1) of the Act, 21 U.S.C. section 360bbb-3(b)(1), unless the authorization is terminated or revoked.  Performed at [facility], [address], [place], [place] [address]          Radiology Studies: No results found.      Scheduled Meds:  digoxin  0.125 mg Oral Daily   doxycycline  100 mg Oral Q12H   empagliflozin  10 mg Oral Daily   enoxaparin (LOVENOX) injection  40 mg Subcutaneous Q24H  magnesium oxide  400 mg Oral Daily   potassium chloride  40 mEq Oral Daily   sacubitril-valsartan  1 tablet Oral BID   sodium chloride flush  3 mL Intravenous Q12H   spironolactone  25 mg Oral Daily   Continuous Infusions:  sodium chloride     [START ON 07/19/2020] sodium chloride       LOS: 2 days    Time spent: over 30 min    Lacretia Nicksaldwell Powell, MD [facility]   To contact the attending provider between 7A-7P or the covering provider during after hours 7P-7A, please log into the web site [email or web address] and access using universal [place] password for that web site. If you do not have the password, please call the hospital operator.  07/18/2020, 5:32 PM

## 2020-07-18 NOTE — Progress Notes (Signed)
Spoke with patients son, [place], who requested an update about his father and he wanted to be added to the contact list. RN asked permission from patient and he states not to add [place] as a contact and to leave Casimiro Needle (who is already listed).

## 2020-07-19 ENCOUNTER — Encounter (HOSPITAL_COMMUNITY)
Admission: EM | Disposition: A | Discharge status: Home / Self Care | Payer: Self-pay | Source: Home / Self Care | Attending: Family Medicine

## 2020-07-19 ENCOUNTER — Encounter (HOSPITAL_COMMUNITY): Payer: Self-pay | Admitting: Internal Medicine

## 2020-07-19 ENCOUNTER — Other Ambulatory Visit (HOSPITAL_COMMUNITY): Payer: Self-pay

## 2020-07-19 HISTORY — PX: RIGHT/LEFT HEART CATH AND CORONARY ANGIOGRAPHY: CATH118266

## 2020-07-19 LAB — CBC WITH DIFFERENTIAL/PLATELET
Abs Immature Granulocytes: 0.02 10*3/uL (ref 0.00–0.07)
Basophils Absolute: 0.1 10*3/uL (ref 0.0–0.1)
Basophils Relative: 1 %
Eosinophils Absolute: 0.2 10*3/uL (ref 0.0–0.5)
Eosinophils Relative: 3 %
HCT: 43.6 % (ref 39.0–52.0)
Hemoglobin: 13.6 g/dL (ref 13.0–17.0)
Immature Granulocytes: 0 %
Lymphocytes Relative: 20 %
Lymphs Abs: 1.2 10*3/uL (ref 0.7–4.0)
MCH: 30.6 pg (ref 26.0–34.0)
MCHC: 31.2 g/dL (ref 30.0–36.0)
MCV: 98.2 fL (ref 80.0–100.0)
Monocytes Absolute: 0.7 10*3/uL (ref 0.1–1.0)
Monocytes Relative: 11 %
Neutro Abs: 4.2 10*3/uL (ref 1.7–7.7)
Neutrophils Relative %: 65 %
Platelets: 347 10*3/uL (ref 150–400)
RBC: 4.44 MIL/uL (ref 4.22–5.81)
RDW: 14.3 % (ref 11.5–15.5)
WBC: 6.4 10*3/uL (ref 4.0–10.5)
nRBC: 0 % (ref 0.0–0.2)

## 2020-07-19 LAB — POCT I-STAT 7, (LYTES, BLD GAS, ICA,H+H)
Acid-base deficit: 3 mmol/L — ABNORMAL HIGH (ref 0.0–2.0)
Bicarbonate: 23.7 mmol/L (ref 20.0–28.0)
Calcium, Ion: 1.06 mmol/L — ABNORMAL LOW (ref 1.15–1.40)
HCT: 40 % (ref 39.0–52.0)
Hemoglobin: 13.6 g/dL (ref 13.0–17.0)
O2 Saturation: 63 %
Potassium: 3.8 mmol/L (ref 3.5–5.1)
Sodium: 143 mmol/L (ref 135–145)
TCO2: 25 mmol/L (ref 22–32)
pCO2 arterial: 48.7 mmHg — ABNORMAL HIGH (ref 32.0–48.0)
pH, Arterial: 7.296 — ABNORMAL LOW (ref 7.350–7.450)
pO2, Arterial: 36 mmHg — CL (ref 83.0–108.0)

## 2020-07-19 LAB — COMPREHENSIVE METABOLIC PANEL
ALT: 36 U/L (ref 0–44)
AST: 28 U/L (ref 15–41)
Albumin: 3.3 g/dL — ABNORMAL LOW (ref 3.5–5.0)
Alkaline Phosphatase: 72 U/L (ref 38–126)
Anion gap: 10 (ref 5–15)
BUN: 16 mg/dL (ref 6–20)
CO2: 24 mmol/L (ref 22–32)
Calcium: 8.8 mg/dL — ABNORMAL LOW (ref 8.9–10.3)
Chloride: 102 mmol/L (ref 98–111)
Creatinine, Ser: 1.23 mg/dL (ref 0.61–1.24)
GFR, Estimated: >60 mL/min (ref >60)
Glucose, Bld: 103 mg/dL — ABNORMAL HIGH (ref 70–99)
Potassium: 4.5 mmol/L (ref 3.5–5.1)
Sodium: 136 mmol/L (ref 135–145)
Total Bilirubin: 0.7 mg/dL (ref 0.3–1.2)
Total Protein: 6.3 g/dL — ABNORMAL LOW (ref 6.5–8.1)

## 2020-07-19 LAB — MAGNESIUM: Magnesium: 2.3 mg/dL (ref 1.7–2.4)

## 2020-07-19 LAB — PHOSPHORUS: Phosphorus: 4 mg/dL (ref 2.5–4.6)

## 2020-07-19 SURGERY — RIGHT/LEFT HEART CATH AND CORONARY ANGIOGRAPHY
Anesthesia: LOCAL

## 2020-07-19 MED ORDER — SODIUM CHLORIDE 0.9% FLUSH: 3.0000 mL | INTRAVENOUS | Status: DC | PRN

## 2020-07-19 MED ORDER — ACETAMINOPHEN 325 MG PO TABS: 650.0000 mg | ORAL_TABLET | ORAL | Status: DC | PRN

## 2020-07-19 MED ORDER — ASPIRIN 81 MG PO CHEW
81.0000 mg | CHEWABLE_TABLET | ORAL | Status: AC
  Administered 2020-07-19: 81 mg via ORAL
  Filled 2020-07-19: qty 1

## 2020-07-19 MED ORDER — MIDAZOLAM HCL 2 MG/2ML IJ SOLN
INTRAMUSCULAR | Status: DC | PRN
  Administered 2020-07-19: 2 mg via INTRAVENOUS

## 2020-07-19 MED ORDER — SODIUM CHLORIDE 0.9 % IV SOLN: INTRAVENOUS | Status: AC

## 2020-07-19 MED ORDER — SODIUM CHLORIDE 0.9 % IV SOLN: 250.0000 mL | INTRAVENOUS | Status: DC | PRN

## 2020-07-19 MED ORDER — LABETALOL HCL 5 MG/ML IV SOLN: 10.0000 mg | INTRAVENOUS | Status: AC | PRN

## 2020-07-19 MED ORDER — HEPARIN (PORCINE) IN NACL 1000-0.9 UT/500ML-% IV SOLN
INTRAVENOUS | Status: DC | PRN
  Administered 2020-07-19 (×3): 500 mL

## 2020-07-19 MED ORDER — LIDOCAINE HCL (PF) 1 % IJ SOLN
INTRAMUSCULAR | Status: DC | PRN
  Administered 2020-07-19 (×2): 2 mL

## 2020-07-19 MED ORDER — MIDAZOLAM HCL 2 MG/2ML IJ SOLN
INTRAMUSCULAR | Status: AC
  Filled 2020-07-19: qty 2

## 2020-07-19 MED ORDER — HEPARIN (PORCINE) IN NACL 1000-0.9 UT/500ML-% IV SOLN
INTRAVENOUS | Status: AC
  Filled 2020-07-19: qty 1000

## 2020-07-19 MED ORDER — FENTANYL CITRATE (PF) 100 MCG/2ML IJ SOLN
INTRAMUSCULAR | Status: AC
  Filled 2020-07-19: qty 2

## 2020-07-19 MED ORDER — HYDRALAZINE HCL 20 MG/ML IJ SOLN: 10.0000 mg | INTRAMUSCULAR | Status: AC | PRN

## 2020-07-19 MED ORDER — FENTANYL CITRATE (PF) 100 MCG/2ML IJ SOLN
INTRAMUSCULAR | Status: DC | PRN
  Administered 2020-07-19: 25 ug via INTRAVENOUS

## 2020-07-19 MED ORDER — HEPARIN SODIUM (PORCINE) 1000 UNIT/ML IJ SOLN
INTRAMUSCULAR | Status: DC | PRN
  Administered 2020-07-19: 3500 [IU] via INTRAVENOUS

## 2020-07-19 MED ORDER — LIDOCAINE HCL (PF) 1 % IJ SOLN
INTRAMUSCULAR | Status: AC
  Filled 2020-07-19: qty 30

## 2020-07-19 MED ORDER — VERAPAMIL HCL 2.5 MG/ML IV SOLN
INTRAVENOUS | Status: AC
  Filled 2020-07-19: qty 2

## 2020-07-19 MED ORDER — SODIUM CHLORIDE 0.9% FLUSH: 3.0000 mL | Freq: Two times a day (BID) | INTRAVENOUS | Status: DC

## 2020-07-19 MED ORDER — IOHEXOL 350 MG/ML SOLN
INTRAVENOUS | Status: DC | PRN
  Administered 2020-07-19: 35 mL via INTRA_ARTERIAL

## 2020-07-19 MED ORDER — HEPARIN SODIUM (PORCINE) 1000 UNIT/ML IJ SOLN
INTRAMUSCULAR | Status: AC
  Filled 2020-07-19: qty 1

## 2020-07-19 MED ORDER — ENOXAPARIN SODIUM 40 MG/0.4ML IJ SOSY
40.0000 mg | PREFILLED_SYRINGE | INTRAMUSCULAR | Status: DC
  Administered 2020-07-20: 40 mg via SUBCUTANEOUS
  Filled 2020-07-19: qty 0.4

## 2020-07-19 MED ORDER — VERAPAMIL HCL 2.5 MG/ML IV SOLN
INTRAVENOUS | Status: DC | PRN
  Administered 2020-07-19: 10 mL via INTRA_ARTERIAL

## 2020-07-19 MED ORDER — ONDANSETRON HCL 4 MG/2ML IJ SOLN: 4.0000 mg | Freq: Four times a day (QID) | INTRAMUSCULAR | Status: DC | PRN

## 2020-07-19 SURGICAL SUPPLY — 12 items
CATH 5FR JL3.5 JR4 ANG PIG MP (CATHETERS) ×2 IMPLANT
CATH BALLN WEDGE 5F 110CM (CATHETERS) ×2 IMPLANT
DEVICE RAD COMP TR BAND LRG (VASCULAR PRODUCTS) ×2 IMPLANT
GLIDESHEATH SLEND SS 6F .021 (SHEATH) ×2 IMPLANT
GUIDEWIRE INQWIRE 1.5J.035X260 (WIRE) ×1 IMPLANT
INQWIRE 1.5J .035X260CM (WIRE) ×2
KIT MICROPUNCTURE NIT STIFF (SHEATH) ×2 IMPLANT
PACK CARDIAC CATHETERIZATION (CUSTOM PROCEDURE TRAY) ×2 IMPLANT
SHEATH GLIDE SLENDER 4/5FR (SHEATH) ×2 IMPLANT
SHEATH PROBE COVER 6X72 (BAG) ×2 IMPLANT
TRANSDUCER W/STOPCOCK (MISCELLANEOUS) ×2 IMPLANT
TUBING CIL FLEX 10 FLL-RA (TUBING) ×2 IMPLANT

## 2020-07-19 NOTE — Progress Notes (Addendum)
  Advanced Heart Failure Rounding Note   Subjective:    Excellent diuresis again yesterday, -6L in UOP. Wt down another 10 lb (30 overall). CMP pending. Scheduled for R/LHC today.   Reports he feels "awesome". Dyspnea resolved. No LEE. Denies CP.   Objective:   Weight Range:  Vital Signs:   Temp:  [97.6 F (36.4 C)-98.4 F (36.9 C)] 98 F (36.7 C) (07/18 0715) Pulse Rate:  [103-113] 105 (07/18 0715) Resp:  [18] 18 (07/18 0715) BP: (100-104)/(64-78) 100/70 (07/18 0715) SpO2:  [93 %-96 %] 93 % (07/18 0715) Weight:  [74.6 kg] 74.6 kg (07/18 0338) Last BM Date: 07/18/20  Weight change: Filed Weights   07/17/20 0322 07/18/20 0419 07/19/20 0338  Weight: 84.6 kg 79.2 kg 74.6 kg    Intake/Output:   Intake/Output Summary (Last 24 hours) at 07/19/2020 0730 Last data filed at 07/19/2020 0719 Gross per 24 hour  Intake 661.06 ml  Output 6925 ml  Net -6263.94 ml     PHYSICAL EXAM: General:  Well appearing. No respiratory difficulty HEENT: normal Neck: supple. no JVD. Carotids 2+ bilat; no bruits. No lymphadenopathy or thyromegaly appreciated. Cor: PMI nondisplaced. Regular rhythm, mildly tachy rate. No rubs, gallops or murmurs. Lungs: clear Abdomen: soft, nontender, nondistended. No hepatosplenomegaly. No bruits or masses. Good bowel sounds. Extremities: no cyanosis, clubbing, rash, edema + TED hoses  Neuro: alert & oriented x 3, cranial nerves grossly intact. moves all 4 extremities w/o difficulty. Affect pleasant.   Telemetry: Sinus Tach 103 bpm, NSVT 8 beats x 1 Personally reviewed  Labs: Basic Metabolic Panel: Recent Labs  Lab 07/16/20 0412 07/16/20 0926 07/17/20 0341 07/18/20 0239 07/19/20 0341  NA 137  --  135 138  --   K 3.3*  --  3.7 4.1  --   CL 104  --  95* 95*  --   CO2 25  --  33* 36*  --   GLUCOSE 93  --  103* 120*  --   BUN 24*  --  17 13  --   CREATININE 0.96 1.10 1.01 0.96  --   CALCIUM 7.6*  --  8.3* 8.6*  --   MG  --   --  1.8 2.2 2.3  PHOS   --   --   --  3.7 4.0    Liver Function Tests: Recent Labs  Lab 07/16/20 0412 07/18/20 0239  AST 39 27  ALT 42 38  ALKPHOS 67 69  BILITOT 0.8 0.7  PROT 5.6* 6.1*  ALBUMIN 3.2* 3.2*   No results for input(s): LIPASE, AMYLASE in the last 168 hours. No results for input(s): AMMONIA in the last 168 hours.  CBC: Recent Labs  Lab 07/16/20 0213 07/16/20 0926 07/17/20 0341 07/18/20 0239 07/19/20 0341  WBC 13.3* 10.6* 7.5 8.2 6.4  NEUTROABS  --   --   --  6.1 4.2  HGB 12.1* 11.1* 11.1* 11.8* 13.6  HCT 36.8* 34.5* 34.3* 36.4* 43.6  MCV 96.6 96.6 95.0 95.8 98.2  PLT 333 286 265 316 347    Cardiac Enzymes: No results for input(s): CKTOTAL, CKMB, CKMBINDEX, TROPONINI in the last 168 hours.  BNP: BNP (last 3 results) Recent Labs    07/16/20 0223  BNP 564.0*    ProBNP (last 3 results) No results for input(s): PROBNP in the last 8760 hours.    Other results:  Imaging: No results found.   Medications:     Scheduled Medications:  digoxin  0.125 mg Oral Daily     doxycycline  100 mg Oral Q12H   empagliflozin  10 mg Oral Daily   enoxaparin (LOVENOX) injection  40 mg Subcutaneous Q24H   magnesium oxide  400 mg Oral Daily   potassium chloride  40 mEq Oral Daily   sacubitril-valsartan  1 tablet Oral BID   sodium chloride flush  3 mL Intravenous Q12H   spironolactone  25 mg Oral Daily    Infusions:  sodium chloride     sodium chloride 10 mL/hr at 07/19/20 0719    PRN Medications: sodium chloride, acetaminophen, albuterol, sodium chloride flush   Assessment/Plan   1. Acute systolic HF - Echo 07/16/20 EF < 20% RV moderately down mild to mod MR/TR.  - Suspect NICM possibly due to OTC stimulants (he does not use meth). There is some hyper trabeculation in the LV cavity but not felt to be consistent with LV noncompaction. - Volume status much improved. Continue diuresis as per Dr. Flora Lipps  - Continue Entresto 24-26 mg twice daily. BP too soft to titrate  - No  beta-blocker given acute decompensation - Continue digoxin 0.125 mg daily. - Continue spiro 25 mg daily. - Continue Jardiance 10 mg daily - R/L cath today followed by cMRI   2. Left leg erythema - Concern for cellulitis -- started on doxycycline by primary team - [place] negative for DVT - improved   3. Tobacco use - cessation encouraged   4. SDOH Needs - very concerned about ability to afford meds - will have PharmD help with assessing needs and providing assistance.  Length of Stay: 3   Brittainy Delmer Islam  07/19/2020, 7:30 AM  Advanced Heart Failure Team Pager [phone] (M-F; 7a - 4p)  Please contact [facility] for night-coverage after hours (4p -7a ) and weekends on [email or web address]  Patient seen and examined with the above-signed Advanced Practice Provider and/or Housestaff. I personally reviewed laboratory data, imaging studies and relevant notes. I independently examined the patient and formulated the important aspects of the plan. I have edited the note to reflect any of my changes or salient points. I have personally discussed the plan with the patient and/or family.  Feeling much better. Weight down 30 pounds with diuresis. No CP or SOB.   General:  Well appearing. No resp difficulty HEENT: normal Neck: supple. no JVD. Carotids 2+ bilat; no bruits. No lymphadenopathy or thryomegaly appreciated. Cor: PMI nondisplaced. Regular tachy No rubs, gallops or murmurs. Lungs: clear Abdomen: soft, nontender, nondistended. No hepatosplenomegaly. No bruits or masses. Good bowel sounds. Extremities: no cyanosis, clubbing, rash, edema Neuro: alert & orientedx3, cranial nerves grossly intact. moves all 4 extremities w/o difficulty. Affect pleasant  Looks much better after large-volume diuresis. Still a bit tachy. Plan R/L cath today followed by cMRI. Procedures discussed. PharmD to see to help with medication assistance. May need Paramedicine support as well.   Arvilla Meres, MD   9:46 AM

## 2020-07-19 NOTE — Interval H&P Note (Signed)
History and Physical Interval Note:  07/19/2020 1:33 PM  Caleb Landry  has presented today for surgery, with the diagnosis of CHF.  The various methods of treatment have been discussed with the patient and family. After consideration of risks, benefits and other options for treatment, the patient has consented to  Procedure(s): RIGHT/LEFT HEART CATH AND CORONARY ANGIOGRAPHY (N/A) and possible coronary angioplasty as a surgical intervention.  The patient's history has been reviewed, patient examined, no change in status, stable for surgery.  I have reviewed the patient's chart and labs.  Questions were answered to the patient's satisfaction.     Driana Dazey

## 2020-07-19 NOTE — H&P (View-Only) (Signed)
Advanced Heart Failure Rounding Note   Subjective:    Excellent diuresis again yesterday, -6L in UOP. Wt down another 10 lb (30 overall). CMP pending. Scheduled for [facility] today.   Reports he feels "awesome". Dyspnea resolved. No LEE. Denies CP.   Objective:   Weight Range:  Vital Signs:   Temp:  [97.6 F (36.4 C)-98.4 F (36.9 C)] 98 F (36.7 C) (07/18 0715) Pulse Rate:  [103-113] 105 (07/18 0715) Resp:  [18] 18 (07/18 0715) BP: (100-104)/(64-78) 100/70 (07/18 0715) SpO2:  [93 %-96 %] 93 % (07/18 0715) Weight:  [74.6 kg] 74.6 kg (07/18 0338) Last BM Date: 07/18/20  Weight change: Filed Weights   07/17/20 0322 07/18/20 0419 07/19/20 0338  Weight: 84.6 kg 79.2 kg 74.6 kg    Intake/Output:   Intake/Output Summary (Last 24 hours) at 07/19/2020 0730 Last data filed at 07/19/2020 0719 Gross per 24 hour  Intake 661.06 ml  Output 6925 ml  Net -6263.94 ml     PHYSICAL EXAM: General:  Well appearing. No respiratory difficulty HEENT: normal Neck: supple. no JVD. Carotids 2+ bilat; no bruits. No lymphadenopathy or thyromegaly appreciated. Cor: PMI nondisplaced. Regular rhythm, mildly tachy rate. No rubs, gallops or murmurs. Lungs: clear Abdomen: soft, nontender, nondistended. No hepatosplenomegaly. No bruits or masses. Good bowel sounds. Extremities: no cyanosis, clubbing, rash, edema + TED hoses  Neuro: alert & oriented x 3, cranial nerves grossly intact. moves all 4 extremities w/o difficulty. Affect pleasant.   Telemetry: Sinus Tach 103 bpm, NSVT 8 beats x 1 Personally reviewed  Labs: Basic Metabolic Panel: Recent Labs  Lab 07/16/20 0412 07/16/20 0926 07/17/20 0341 07/18/20 0239 07/19/20 0341  NA 137  --  135 138  --   K 3.3*  --  3.7 4.1  --   CL 104  --  95* 95*  --   CO2 25  --  33* 36*  --   GLUCOSE 93  --  103* 120*  --   BUN 24*  --  17 13  --   CREATININE 0.96 1.10 1.01 0.96  --   CALCIUM 7.6*  --  8.3* 8.6*  --   MG  --   --  1.8 2.2 2.3  PHOS   --   --   --  3.7 4.0    Liver Function Tests: Recent Labs  Lab 07/16/20 0412 07/18/20 0239  AST 39 27  ALT 42 38  ALKPHOS 67 69  BILITOT 0.8 0.7  PROT 5.6* 6.1*  ALBUMIN 3.2* 3.2*   No results for input(s): LIPASE, AMYLASE in the last 168 hours. No results for input(s): AMMONIA in the last 168 hours.  CBC: Recent Labs  Lab 07/16/20 0213 07/16/20 0926 07/17/20 0341 07/18/20 0239 07/19/20 0341  WBC 13.3* 10.6* 7.5 8.2 6.4  NEUTROABS  --   --   --  6.1 4.2  HGB 12.1* 11.1* 11.1* 11.8* 13.6  HCT 36.8* 34.5* 34.3* 36.4* 43.6  MCV 96.6 96.6 95.0 95.8 98.2  PLT 333 286 265 316 347    Cardiac Enzymes: No results for input(s): CKTOTAL, CKMB, CKMBINDEX, TROPONINI in the last 168 hours.  BNP: BNP (last 3 results) Recent Labs    07/16/20 0223  BNP 564.0*    ProBNP (last 3 results) No results for input(s): PROBNP in the last 8760 hours.    Other results:  Imaging: No results found.   Medications:     Scheduled Medications:  digoxin  0.125 mg Oral Daily  doxycycline  100 mg Oral Q12H   empagliflozin  10 mg Oral Daily   enoxaparin (LOVENOX) injection  40 mg Subcutaneous Q24H   magnesium oxide  400 mg Oral Daily   potassium chloride  40 mEq Oral Daily   sacubitril-valsartan  1 tablet Oral BID   sodium chloride flush  3 mL Intravenous Q12H   spironolactone  25 mg Oral Daily    Infusions:  sodium chloride     sodium chloride 10 mL/hr at 07/19/20 0719    PRN Medications: sodium chloride, acetaminophen, albuterol, sodium chloride flush   Assessment/Plan   1. Acute systolic HF - Echo 07/16/20 EF < 20% RV moderately down mild to mod MR/TR.  - Suspect NICM possibly due to OTC stimulants (he does not use meth). There is some hyper trabeculation in the LV cavity but not felt to be consistent with LV noncompaction. - Volume status much improved. Continue diuresis as per Dr. Flora Lipps  - Continue Entresto 24-26 mg twice daily. BP too soft to titrate  - No  beta-blocker given acute decompensation - Continue digoxin 0.125 mg daily. - Continue spiro 25 mg daily. - Continue Jardiance 10 mg daily - R/L cath today followed by cMRI   2. Left leg erythema - Concern for cellulitis -- started on doxycycline by primary team - [place] negative for DVT - improved   3. Tobacco use - cessation encouraged   4. SDOH Needs - very concerned about ability to afford meds - will have PharmD help with assessing needs and providing assistance.  Length of Stay: 3   Brittainy Delmer Islam  07/19/2020, 7:30 AM  Advanced Heart Failure Team Pager [phone] (M-F; 7a - 4p)  Please contact [facility] for night-coverage after hours (4p -7a ) and weekends on [email or web address]  Patient seen and examined with the above-signed Advanced Practice Provider and/or Housestaff. I personally reviewed laboratory data, imaging studies and relevant notes. I independently examined the patient and formulated the important aspects of the plan. I have edited the note to reflect any of my changes or salient points. I have personally discussed the plan with the patient and/or family.  Feeling much better. Weight down 30 pounds with diuresis. No CP or SOB.   General:  Well appearing. No resp difficulty HEENT: normal Neck: supple. no JVD. Carotids 2+ bilat; no bruits. No lymphadenopathy or thryomegaly appreciated. Cor: PMI nondisplaced. Regular tachy No rubs, gallops or murmurs. Lungs: clear Abdomen: soft, nontender, nondistended. No hepatosplenomegaly. No bruits or masses. Good bowel sounds. Extremities: no cyanosis, clubbing, rash, edema Neuro: alert & orientedx3, cranial nerves grossly intact. moves all 4 extremities w/o difficulty. Affect pleasant  Looks much better after large-volume diuresis. Still a bit tachy. Plan R/L cath today followed by cMRI. Procedures discussed. PharmD to see to help with medication assistance. May need Paramedicine support as well.   Arvilla Meres, MD   9:46 AM

## 2020-07-19 NOTE — Plan of Care (Signed)
  Problem: Health Behavior/Discharge Planning: Goal: Ability to manage health-related needs will improve Outcome: Progressing   Problem: Clinical Measurements: Goal: Ability to maintain clinical measurements within normal limits will improve Outcome: Progressing   

## 2020-07-19 NOTE — Progress Notes (Addendum)
PROGRESS NOTE    Caleb Landry  HYQ:[id number] DOB: June 17, 1970 DOA: 07/16/2020 PCP: Pcp, No   Chief Complaint  Patient presents with   Leg Swelling   Shortness of Breath    Brief Narrative: Caleb Landry is Caleb Landry 50 y.o. male with history of asthma, tobacco abuse presents to the ER because of worsening swelling of the lower extremities.  Patient states he noticed the swelling on the left lower extremity over the last 1 month and the right lower extremity started swelling over the last few days which has progressed very quick over the last few days involving his scrotum at this time.  Over the last few days he also has been having shortness of breath exertional and orthopnea.  Denies any chest pain productive cough fever or chills.  Patient states he used to be on albuterol for asthma which he is off at this time since he moved to [place] over Caleb Landry year ago from [place].   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  Holding off on beta blocker with acute decompensation.  Digoxin.  Jardiance.  K>4, mg>2 Strict I/O, daily weights Troponin elevated, mild and flat - likely demand Cardiology c/s, appreciate recs - plan for [facility] 7/18 (normal coronaries, severe NICM 20-25%, low filling pressures with moderately reduced cardiac output).  SPEP/UPEP, considering cardiac MRI inpatient.  Denies any illicit drug use - notes use of OTC supplements -> "stackers", "jet alert", "mini thin"  History of Asthma Continue albuterol prn wheezing  Lower Extremity Swelling  Presumed Left Lower Extremity Cellulitis Continue doxycycline Enlarged LN's  in Groin on Left - Noted, possibly 2/2 cellulitis - follow   Tobacco Abuse Encourage cessation   Normocytic Anemia Follow anemia labs  Nodular densities in upper lobe/apices Needs repeat CT in 3-6 months and then 18-24 months  DVT prophylaxis: lovenox Code Status: full  Family Communication: none at bedside - called friend michael, no answer Disposition:   Status is: Inpatient  Remains inpatient appropriate because:Inpatient level of care appropriate due to severity of illness  Dispo: The patient is from: Home              Anticipated d/c is to: Home              Patient currently is not medically stable to d/c.   Difficult to place patient No       Consultants:  cardiology  Procedures:    There is severe left ventricular systolic dysfunction.   The left ventricular ejection fraction is less than 25% by visual estimate.   Findings:   Ao = 96/70 (81) LV = 90/8 RA =  1 RV = 29/2 PA = 29/8 (15) PCW = 5 Fick cardiac output/index = 4.1/2.1 PVR = 2.5 SVR = 1572 Ao sat = 96% PA sat = 62%, 63%   Assessment: Normal coronary arteries Severe NICM EF 20-25% Low filling pressures with moderately reduced cardiac output   Plan/Discussion:   Medical therapy. Check cMRI.  Echo IMPRESSIONS     1. Left ventricular ejection fraction, by estimation, is <20%. The left  ventricle has severely decreased function. The left ventricle demonstrates  global  hypokinesis. The left ventricular internal cavity size was  moderately dilated. There is mild  concentric left ventricular hypertrophy. Left ventricular diastolic  parameters are consistent with Grade III diastolic dysfunction  (restrictive). Elevated left atrial pressure. Non Compaction Fraction <  2.3 my Caleb Landry Criteria. No LV thrombus.   2. Right ventricular systolic function is moderately reduced. The right  ventricular size is moderately enlarged. There is moderately elevated  pulmonary artery systolic  pressure. The estimated right ventricular  systolic pressure is 54.7 mmHg.   3. Left atrial size was severely dilated.   4. Right atrial size was moderately dilated.   5. Caleb Landry small pericardial effusion is present.   6. The mitral valve is grossly normal. Mild to moderate mitral valve  regurgitation- mechanism appears to be ventricular functional. No evidence  of mitral stenosis.   7. Tricuspid valve regurgitation is mild to moderate- mechanisms appears  to be ventricular functional.   8. The aortic valve was not well visualized. Aortic valve regurgitation  is not visualized. No aortic stenosis is present.   9. Aortic dilatation noted. There is mild dilatation of the ascending  aorta, measuring 41 mm.  10. The inferior vena cava is dilated in size with <50% respiratory  variability, suggesting right atrial pressure of 15 mmHg.   Comparison(s): No prior Echocardiogram.   Conclusion(s)/Recommendation(s): New Biventricular dysfunction. Discussed  with primary MD. Constrasted images do not meet criteria for LV  non-compaction or show LV thrombus. CMR could be considered in the future  if clinically indicated.   LE US Summary:  BILATERAL:  - No evidence of deep vein thrombosis seen in the lower extremities,  bilaterally.  - No evidence of superficial venous thrombosis in the lower extremities,  bilaterally.  -No evidence of popliteal cyst, bilaterally.  -Subcutaneous edema of BLE.     LEFT:  - Ultrasound characteristics of enlarged lymph nodes noted in the groin  Antimicrobials: Anti-infectives (From admission, onward)    Start     Dose/Rate Route Frequency Ordered Stop   07/16/20 1000  doxycycline (VIBRA-TABS) tablet 100 mg        100 mg Oral Every 12 hours 07/16/20 0903 07/21/20 0959   07/16/20 0845  clindamycin (CLEOCIN) IVPB 600 mg  Status:  Discontinued        600 mg 100 mL/hr over 30 Minutes Intravenous  Once 07/16/20 0843 07/16/20 0902          Subjective: Feels  better overall   Objective: Vitals:   07/19/20 1432 07/19/20 1504 07/19/20 1512 07/19/20 1531  BP: 96/77 97/67 106/74 100/68  Pulse: (!) 105 (!) 111 (!) 110 (!) 108  Resp:      Temp:      TempSrc:      SpO2: 93% 93% 93% 95%  Weight:      Height:        Intake/Output Summary (Last 24 hours) at 07/19/2020 1646 Last data filed at 07/19/2020 1543 Gross per 24 hour  Intake 1141.06 ml  Output 4900 ml  Net -3758.94 ml   Filed Weights   07/17/20 0322 07/18/20 0419 07/19/20 0338  Weight: 84.6 kg 79.2 kg 74.6 kg    Examination: General: No acute distress. Cardiovascular: RRR Lungs: unlabored Abdomen: Soft, nontender, nondistended  Neurological: Alert and oriented 3. Moves all extremities 4 . Cranial nerves II through XII grossly intact. Skin: Warm and dry. No rashes or lesions. Extremities: No clubbing or cyanosis. No edema.    Data Reviewed: I have personally reviewed  following labs and imaging studies  CBC: Recent Labs  Lab 07/16/20 0213 07/16/20 0926 07/17/20 0341 07/18/20 0239 07/19/20 0341  WBC 13.3* 10.6* 7.5 8.2 6.4  NEUTROABS  --   --   --  6.1 4.2  HGB 12.1* 11.1* 11.1* 11.8* 13.6  HCT 36.8* 34.5* 34.3* 36.4* 43.6  MCV 96.6 96.6 95.0 95.8 98.2  PLT 333 286 265 316 347    Basic Metabolic Panel: Recent Labs  Lab 07/16/20 0412 07/16/20 0926 07/17/20 0341 07/18/20 0239 07/19/20 0341  NA 137  --  135 138 136  K 3.3*  --  3.7 4.1 4.5  CL 104  --  95* 95* 102  CO2 25  --  33* 36* 24  GLUCOSE 93  --  103* 120* 103*  BUN 24*  --  17 13 16   CREATININE 0.96 1.10 1.01 0.96 1.23  CALCIUM 7.6*  --  8.3* 8.6* 8.8*  MG  --   --  1.8 2.2 2.3  PHOS  --   --   --  3.7 4.0    GFR: Estimated Creatinine Clearance: 75 mL/min (by C-G formula based on SCr of 1.23 mg/dL).  Liver Function Tests: Recent Labs  Lab 07/16/20 0412 07/18/20 0239 07/19/20 0341  AST 39 27 28  ALT 42 38 36  ALKPHOS 67 69 72  BILITOT 0.8 0.7 0.7  PROT 5.6* 6.1* 6.3*  ALBUMIN 3.2*  3.2* 3.3*    CBG: Recent Labs  Lab 07/18/20 2110  GLUCAP 110*     Recent Results (from the past 240 hour(s))  Resp Panel by RT-PCR (Flu Jj Enyeart&B, Covid) Nasopharyngeal Swab     Status: None   Collection Time: 07/16/20  2:23 AM   Specimen: Nasopharyngeal Swab; Nasopharyngeal(NP) swabs in vial transport medium  Result Value Ref Range Status   SARS Coronavirus 2 by RT PCR NEGATIVE NEGATIVE Final    Comment: (NOTE) SARS-CoV-2 target nucleic acids are NOT DETECTED.  The SARS-CoV-2 RNA is generally detectable in upper respiratory specimens during the acute phase of infection. The lowest concentration of SARS-CoV-2 viral copies this assay can detect is 138 copies/mL. Kohana Amble negative result does not preclude SARS-Cov-2 infection and should not be used as the sole basis for treatment or other patient management decisions. Brenn Gatton negative result may occur with  improper specimen collection/handling, submission of specimen other than nasopharyngeal swab, presence of viral mutation(s) within the areas targeted by this assay, and inadequate number of viral copies(<138 copies/mL). Jenelle Drennon negative result must be combined with clinical observations, patient history, and epidemiological information. The expected result is Negative.  Fact Sheet for Patients:  07/18/20  Fact Sheet for Healthcare Providers:  BloggerCourse.com  This test is no t yet approved or cleared by the [place] FDA and  has been authorized for detection and/or diagnosis of SARS-CoV-2 by FDA under an Emergency Use Authorization (EUA). This EUA will remain  in effect (meaning this test can be used) for the duration of the COVID-19 declaration under Section 564(b)(1) of the Act, 21 U.S.C.section 360bbb-3(b)(1), unless the authorization is terminated  or revoked sooner.       Influenza Kani Chauvin by PCR NEGATIVE NEGATIVE Final   Influenza B by PCR NEGATIVE NEGATIVE Final    Comment:  (NOTE) The Xpert Xpress SARS-CoV-2/FLU/RSV plus assay is intended as an aid in the diagnosis of influenza from Nasopharyngeal swab specimens and should not be used as Anayely Constantine sole basis for treatment. Nasal washings and aspirates are unacceptable for Xpert Xpress SARS-CoV-2/FLU/RSV testing.  Fact Sheet for Patients: BloggerCourse.com  Fact Sheet for Healthcare Providers: SeriousBroker.it  This test is not yet approved or cleared by the [place] FDA and has been authorized for detection and/or diagnosis of SARS-CoV-2 by FDA under an Emergency Use Authorization (EUA). This EUA will remain in effect (meaning this test can be used) for the duration of the COVID-19 declaration under Section 564(b)(1) of the Act, 21 U.S.C. section 360bbb-3(b)(1), unless the authorization is terminated or revoked.  Performed at [facility], [address] [place], [place] [address]          Radiology Studies: CARDIAC CATHETERIZATION  Result Date: 07/19/2020 Formatting of this result is different from the original.   There is severe left ventricular systolic dysfunction.   The left ventricular ejection fraction is less than 25% by visual estimate. Findings: Ao = 96/70 (81) LV = 90/8 RA =  1 RV = 29/2 PA = 29/8 (15) PCW = 5 Fick cardiac output/index = 4.1/2.1 PVR = 2.5 SVR = 1572 Ao sat = 96% PA sat = 62%, 63% Assessment: Normal coronary arteries Severe NICM EF 20-25% Low filling pressures with moderately reduced cardiac output Plan/Discussion: Medical therapy. Check cMRI. Arvilla Meres, MD 2:00 PM       Scheduled Meds:  digoxin  0.125 mg Oral Daily   doxycycline  100 mg Oral Q12H   empagliflozin  10 mg Oral Daily   [START ON 07/20/2020] enoxaparin (LOVENOX) injection  40 mg Subcutaneous Q24H   magnesium oxide  400 mg Oral Daily   potassium chloride  40 mEq Oral Daily   sacubitril-valsartan  1 tablet Oral BID   sodium  chloride flush  3 mL Intravenous Q12H   spironolactone  25 mg Oral Daily   Continuous Infusions:  sodium chloride 50 mL/hr at 07/19/20 1427   sodium chloride       LOS: 3 days    Time spent: over 30 min    Lacretia Nicks, MD [facility]   To contact the attending provider between 7A-7P or the covering provider during after hours 7P-7A, please log into the web site [email or web address] and access using universal Secretary password for that web site. If you do not have the password, please call the hospital operator.  07/19/2020, 4:46 PM

## 2020-07-19 NOTE — Plan of Care (Signed)

## 2020-07-20 ENCOUNTER — Other Ambulatory Visit (HOSPITAL_COMMUNITY): Payer: Self-pay

## 2020-07-20 ENCOUNTER — Inpatient Hospital Stay (HOSPITAL_COMMUNITY): Payer: Self-pay

## 2020-07-20 DIAGNOSIS — I5089 Other heart failure: Secondary | ICD-10-CM

## 2020-07-20 LAB — POCT I-STAT 7, (LYTES, BLD GAS, ICA,H+H)
Acid-base deficit: 1 mmol/L (ref 0.0–2.0)
Acid-base deficit: 3 mmol/L — ABNORMAL HIGH (ref 0.0–2.0)
Bicarbonate: 23.7 mmol/L (ref 20.0–28.0)
Bicarbonate: 25.9 mmol/L (ref 20.0–28.0)
Calcium, Ion: 1.19 mmol/L (ref 1.15–1.40)
Calcium, Ion: 1.27 mmol/L (ref 1.15–1.40)
HCT: 42 % (ref 39.0–52.0)
HCT: 43 % (ref 39.0–52.0)
Hemoglobin: 14.3 g/dL (ref 13.0–17.0)
Hemoglobin: 14.6 g/dL (ref 13.0–17.0)
O2 Saturation: 62 %
O2 Saturation: 96 %
Potassium: 4.5 mmol/L (ref 3.5–5.1)
Potassium: 4.6 mmol/L (ref 3.5–5.1)
Sodium: 140 mmol/L (ref 135–145)
Sodium: 140 mmol/L (ref 135–145)
TCO2: 25 mmol/L (ref 22–32)
TCO2: 27 mmol/L (ref 22–32)
pCO2 arterial: 45.9 mmHg (ref 32.0–48.0)
pCO2 arterial: 52.9 mmHg — ABNORMAL HIGH (ref 32.0–48.0)
pH, Arterial: 7.298 — ABNORMAL LOW (ref 7.350–7.450)
pH, Arterial: 7.321 — ABNORMAL LOW (ref 7.350–7.450)
pO2, Arterial: 36 mmHg — CL (ref 83.0–108.0)
pO2, Arterial: 89 mmHg (ref 83.0–108.0)

## 2020-07-20 LAB — CBC WITH DIFFERENTIAL/PLATELET
Abs Immature Granulocytes: 0.02 10*3/uL (ref 0.00–0.07)
Basophils Absolute: 0.1 10*3/uL (ref 0.0–0.1)
Basophils Relative: 1 %
Eosinophils Absolute: 0.3 10*3/uL (ref 0.0–0.5)
Eosinophils Relative: 5 %
HCT: 45.5 % (ref 39.0–52.0)
Hemoglobin: 14.7 g/dL (ref 13.0–17.0)
Immature Granulocytes: 0 %
Lymphocytes Relative: 28 %
Lymphs Abs: 1.7 10*3/uL (ref 0.7–4.0)
MCH: 30.9 pg (ref 26.0–34.0)
MCHC: 32.3 g/dL (ref 30.0–36.0)
MCV: 95.6 fL (ref 80.0–100.0)
Monocytes Absolute: 0.9 10*3/uL (ref 0.1–1.0)
Monocytes Relative: 15 %
Neutro Abs: 3 10*3/uL (ref 1.7–7.7)
Neutrophils Relative %: 51 %
Platelets: 376 10*3/uL (ref 150–400)
RBC: 4.76 MIL/uL (ref 4.22–5.81)
RDW: 14.2 % (ref 11.5–15.5)
WBC: 6 10*3/uL (ref 4.0–10.5)
nRBC: 0 % (ref 0.0–0.2)

## 2020-07-20 LAB — COMPREHENSIVE METABOLIC PANEL
ALT: 35 U/L (ref 0–44)
AST: 27 U/L (ref 15–41)
Albumin: 3.2 g/dL — ABNORMAL LOW (ref 3.5–5.0)
Alkaline Phosphatase: 78 U/L (ref 38–126)
Anion gap: 5 (ref 5–15)
BUN: 18 mg/dL (ref 6–20)
CO2: 28 mmol/L (ref 22–32)
Calcium: 8.9 mg/dL (ref 8.9–10.3)
Chloride: 103 mmol/L (ref 98–111)
Creatinine, Ser: 0.99 mg/dL (ref 0.61–1.24)
GFR, Estimated: >60 mL/min (ref >60)
Glucose, Bld: 106 mg/dL — ABNORMAL HIGH (ref 70–99)
Potassium: 4.5 mmol/L (ref 3.5–5.1)
Sodium: 136 mmol/L (ref 135–145)
Total Bilirubin: 0.6 mg/dL (ref 0.3–1.2)
Total Protein: 6.3 g/dL — ABNORMAL LOW (ref 6.5–8.1)

## 2020-07-20 LAB — PROTEIN ELECTROPHORESIS, SERUM
A/G Ratio: 1.2 (ref 0.7–1.7)
Albumin ELP: 3.6 g/dL (ref 2.9–4.4)
Alpha-1-Globulin: 0.3 g/dL (ref 0.0–0.4)
Alpha-2-Globulin: 0.7 g/dL (ref 0.4–1.0)
Beta Globulin: 0.9 g/dL (ref 0.7–1.3)
Gamma Globulin: 0.9 g/dL (ref 0.4–1.8)
Globulin, Total: 2.9 g/dL (ref 2.2–3.9)
Total Protein ELP: 6.5 g/dL (ref 6.0–8.5)

## 2020-07-20 LAB — PHOSPHORUS: Phosphorus: 4.4 mg/dL (ref 2.5–4.6)

## 2020-07-20 LAB — MAGNESIUM: Magnesium: 2.3 mg/dL (ref 1.7–2.4)

## 2020-07-20 MED ORDER — GADOBUTROL 1 MMOL/ML IV SOLN
10.0000 mL | Freq: Once | INTRAVENOUS | Status: AC | PRN
  Administered 2020-07-20: 10 mL via INTRAVENOUS

## 2020-07-20 MED ORDER — FUROSEMIDE 40 MG PO TABS
40.0000 mg | ORAL_TABLET | Freq: Every day | ORAL | 1 refills | Status: DC
  Filled 2020-07-20: qty 30, 30d supply, fill #0
  Filled 2020-08-18: qty 30, 30d supply, fill #1

## 2020-07-20 MED ORDER — POTASSIUM CHLORIDE CRYS ER 20 MEQ PO TBCR
20.0000 meq | EXTENDED_RELEASE_TABLET | Freq: Every day | ORAL | 0 refills | Status: DC
  Filled 2020-07-20: qty 30, 30d supply, fill #0

## 2020-07-20 MED ORDER — SPIRONOLACTONE 25 MG PO TABS
25.0000 mg | ORAL_TABLET | Freq: Every day | ORAL | 1 refills | Status: DC
  Filled 2020-07-20: qty 30, 30d supply, fill #0
  Filled 2020-08-18: qty 30, 30d supply, fill #1

## 2020-07-20 MED ORDER — SACUBITRIL-VALSARTAN 24-26 MG PO TABS
1.0000 | ORAL_TABLET | Freq: Two times a day (BID) | ORAL | 1 refills | Status: DC
  Filled 2020-07-20: qty 60, 30d supply, fill #0

## 2020-07-20 MED ORDER — POTASSIUM CHLORIDE CRYS ER 20 MEQ PO TBCR: 20.0000 meq | EXTENDED_RELEASE_TABLET | Freq: Every day | ORAL | Status: DC

## 2020-07-20 MED ORDER — FUROSEMIDE 40 MG PO TABS: 40.0000 mg | ORAL_TABLET | Freq: Every day | ORAL | Status: DC

## 2020-07-20 MED ORDER — ALBUTEROL SULFATE HFA 108 (90 BASE) MCG/ACT IN AERS
2.0000 | INHALATION_SPRAY | Freq: Four times a day (QID) | RESPIRATORY_TRACT | 2 refills | Status: DC | PRN
  Filled 2020-07-20: qty 8.5, 25d supply, fill #0
  Filled 2020-08-18: qty 8.5, 25d supply, fill #1
  Filled 2020-12-22: qty 8.5, 25d supply, fill #2
  Filled 2020-12-23 – 2021-01-23 (×2): qty 8.5, 25d supply, fill #0

## 2020-07-20 MED ORDER — DIGOXIN 125 MCG PO TABS
0.1250 mg | ORAL_TABLET | Freq: Every day | ORAL | 1 refills | Status: DC
  Filled 2020-07-20: qty 30, 30d supply, fill #0
  Filled 2020-08-18: qty 30, 30d supply, fill #1

## 2020-07-20 MED ORDER — EMPAGLIFLOZIN 10 MG PO TABS
10.0000 mg | ORAL_TABLET | Freq: Every day | ORAL | 1 refills | Status: DC
  Filled 2020-07-20: qty 14, 14d supply, fill #0

## 2020-07-20 NOTE — TOC Progression Note (Addendum)
Transition of Care (TOC) - Progression Note  Heart Failure  Patient Details  Name: Caleb Landry MRN: [id number] Date of Birth: 04-Apr-1970  Transition of Care [facility]) CM/SW Contact  Tedford Berg, LCSWA Phone Number: 07/20/2020, 3:06 PM  Clinical Narrative:    CSW spoke with patient at bedside and completed a very brief SDOH screening with the patient who reported that he is not driving and his roommate is able to help with transportation. CSW obtained Mr. Caleb Landry signature for the [employer] transportation rider waiver and provided Mr. Caleb Landry with [facility] transportation's information to call for transport as needed. CSW was unable to provide Mr. Caleb Landry with a blood pressure cuff as the HF clinic doesn't have anymore at this time. CSW informed Mr. Caleb Landry to ask for a bp cuff at the HF clinic outpatient appointment if needed. Mr. Caleb Landry stated that he receives Food Stamps $250 a month and he is working on applying for disability with [employer], [employer] Disability. Mr. Caleb Landry called Conservation officer, nature and had the CSW speak with them about him being in the hospital. Mr. Caleb Landry asked the CSW if his transfer from [facility] will be on his discharge paperwork so that he could provide that information to his disability coordinator. CSW spoke with the [facility] about the d/c paperwork and that he transferred from [facility] and that the MD should have that stated on the patients discharge paperwork. CSW relayed this information to the patient who reported understanding. CSW provided Mr. Caleb Landry with an appointment card for the [facility] outpatient clinic and encouraged him to follow up and to attend the appointment and bring his medications and if anything changes to please reach out so that CSW/HV clinic team can provide support.         Expected Discharge Plan and Services                                                 Social Determinants of Health (SDOH) Interventions Food Insecurity Interventions: Other (Comment)  (Pt. reports receiving $250 a month in Sales executive) Financial Strain Interventions: Artist Housing Interventions: Intervention Not Indicated Transportation Interventions: Retail banker  Readmission Risk Interventions No flowsheet data found.  [place]pper Caleb Landry, MSW, LCSWA [phone] Heart Failure Social Worker

## 2020-07-20 NOTE — Discharge Summary (Signed)
Physician Discharge Summary  Caleb Landry ACZ:[id number] DOB: 1970-10-31 DOA: 07/16/2020  PCP: Pcp, No  Admit date: 07/16/2020 Discharge date: 07/20/2020  Time spent: 40 minutes  Recommendations for Outpatient Follow-up:  Follow outpatient CBC/CMP Follow volume status, creatinine/lytes Follow with cardiology outpatient Follow cardiac MRI, pending at time of discharge Continue to adjust GDMT as tolerated Follow abnormal CT scan -> needs repeat CT chest in Caleb Landry few months (3-6 months, then 18-24 months) - follow enlarged L groin LN's  Pending SPEP at time of discharge, follow outpatient   Discharge Diagnoses:  Principal Problem:   Acute congestive heart failure (HCC) Active Problems:   Tobacco abuse   Asthma   Acute CHF (congestive heart failure) (HCC)   Discharge Condition: stable  Diet recommendation: heart healthy, low sodium   Filed Weights   07/18/20 0419 07/19/20 0338 07/20/20 0042  Weight: 79.2 kg 74.6 kg 74.8 kg    History of present illness:  Caleb Landry is Caleb Landry 50 y.o. male with history of asthma, tobacco abuse presents to the ER because of worsening swelling of the lower extremities.  Patient states he noticed the swelling on the left lower extremity over the last 1 month and the right lower extremity started swelling over the last few days which has progressed very quick over the last few days involving his scrotum at this time.  Over the last few days he also has been having shortness of breath exertional and orthopnea.  Denies any chest pain productive cough fever or chills.  Patient states he used to be on albuterol for asthma which he is off at this time since he moved to [place] over Caleb Landry year ago from [place].  He was admitted with Caleb Landry heart failure exacerbation.  He's improved with diuresis.  Advanced HF team saw patient, he's s/p cath with normal coronaries.  Cardiac MRI pending at time of discharge.  Discharged on 7/19 with plan for outpatient follow up.  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  Cardiac MRI pending at time of discharge SPEP pending at time of discharge, UPEP not collected - follow outpatient Denies any illicit drug use - notes use of OTC supplements -> "stackers", "jet alert", "mini thin" ? If etiology related to OTC stimulants   History of Asthma Continue albuterol prn wheezing   Lower Extremity Swelling  Presumed Left Lower Extremity Cellulitis S/p doxy Enlarged LN's in Groin on Left - follow, likely related to celllulits Improved erythema on day of discharge   Tobacco Abuse Encourage cessation   Normocytic Anemia Follow anemia labs   Nodular densities in upper lobe/apices Needs repeat CT in 3-6 months and then 18-24 months  Procedures: L/RHC    There is severe left ventricular systolic dysfunction.   The left ventricular ejection fraction is less than 25% by visual estimate.   Findings:   Ao = 96/70 (81) LV = 90/8 RA =  1 RV = 29/2 PA = 29/8 (15) PCW = 5 Fick cardiac output/index =  4.1/2.1 PVR = 2.5 SVR = 1572 Ao sat = 96% PA sat = 62%, 63%   Assessment: Normal coronary arteries Severe NICM EF 20-25% Low filling pressures with moderately reduced cardiac output   Plan/Discussion:   Medical therapy. Check cMRI.  Echo IMPRESSIONS     1. Left ventricular ejection fraction, by estimation, is <20%. The left  ventricle has severely  decreased function. The left ventricle demonstrates  global hypokinesis. The left ventricular internal cavity size was  moderately dilated. There is mild  concentric left ventricular hypertrophy. Left ventricular diastolic  parameters are consistent with Grade III diastolic dysfunction  (restrictive). Elevated left atrial pressure. Non Compaction Fraction <  2.3 my [name]. No LV thrombus.   2. Right ventricular systolic function is moderately reduced. The right  ventricular size is moderately enlarged. There is moderately elevated  pulmonary artery systolic pressure. The estimated right ventricular  systolic pressure is 54.7 mmHg.   3. Left atrial size was severely dilated.   4. Right atrial size was moderately dilated.   5. [name] small pericardial effusion is present.   6. The mitral valve is grossly normal. Mild to moderate mitral valve  regurgitation- mechanism appears to be ventricular functional. No evidence  of mitral stenosis.   7. Tricuspid valve regurgitation is mild to moderate- mechanisms appears  to be ventricular functional.   8. The aortic valve was not well visualized. Aortic valve regurgitation  is not visualized. No aortic stenosis is present.   9. Aortic dilatation noted. There is mild dilatation of the ascending  aorta, measuring 41 mm.  10. The inferior vena cava is dilated in size with <50% respiratory  variability, suggesting right atrial pressure of 15 mmHg.   Comparison(s): No prior Echocardiogram.   Conclusion(s)/Recommendation(s): New Biventricular dysfunction. Discussed  with primary MD. Constrasted images do not meet criteria for LV  non-compaction or show LV thrombus. CMR could be considered in the future  if clinically indicated.   Consultations: cardiology  Discharge Exam: Vitals:   07/20/20 1115 07/20/20 1609  BP: 110/85 105/64  Pulse: (!) 103 (!) 109  Resp: 20 18  Temp: 97.8 F (36.6 C) 98.1 F (36.7 C)  SpO2: 99% 98%   Feeling  better, looking forward to discharge  General: No acute distress. Cardiovascular: RRR Lungs: unlabored Abdomen: Soft, nontender, nondistended  Neurological: Alert and oriented 3. Moves all extremities 4. Cranial nerves II through XII grossly intact. Skin: Warm and dry. No rashes or lesions. Extremities: No clubbing or cyanosis. No edema.  Erythema improved.  Discharge Instructions   Discharge Instructions     (HEART FAILURE PATIENTS) Call MD:  Anytime you have any of the following symptoms: 1) 3 pound weight gain in 24 hours or 5 pounds in 1 week 2) shortness of breath, with or without Tyaisha Cullom dry hacking cough 3) swelling in the hands, feet or stomach 4) if you have to sleep on extra pillows at night in order to breathe.   Complete by: As directed    Avoid straining   Complete by: As directed    Call MD for:  difficulty breathing, headache or visual disturbances   Complete by: As directed    Call MD for:  extreme fatigue   Complete by: As directed    Call MD for:  hives   Complete by: As directed    Call MD for:  persistant dizziness or light-headedness   Complete by: As directed    Call MD for:  persistant nausea and  vomiting   Complete by: As directed    Call MD for:  redness, tenderness, or signs of infection (pain, swelling, redness, odor or green/yellow discharge around incision site)   Complete by: As directed    Call MD for:  severe uncontrolled pain   Complete by: As directed    Call MD for:  temperature >100.4   Complete by: As directed    Diet - low sodium heart healthy   Complete by: As directed    Diet - low sodium heart healthy   Complete by: As directed    Discharge instructions   Complete by: As directed    You were seen for Octavio Matheney heart failure exacerbation.  You've improved with diuresis.  You've been started on several new medicines.  Continue your entresto, farxiga, spironolactone, digoxin, lasix, and potassium.  You had Siomara Burkel catheterization that did not show  abnormal coronary arteries.  Your cardiac MRI is pending at the time of discharge.  Please follow this up with your PCP.   We think your heart failure is most likely related to your over the counter stimulant use.  Please stop any additional stimulant use.  Do not use any over the counter medicines without discussing this with your PCP or cardiologist.  Please follow up with your PCP as an outpatient.  Please follow up with cardiology outpatient.  You have pending studies from this hospitalization that should be followed up with your PCP and cardiologist.  Ask about your SPEP.  You had an abnormal CT scan.  You had nodular densities in the upper lobes.  You'll need repeat CT scan in Lilyonna Steidle couple of months and then repeat CT scan (discuss with PCP).  Return for new, recurrent, or worsening symptoms.  Please ask your PCP to request records from this hospitalization so they know what was done and what the next steps will be.   Heart Failure patients record your daily weight using the same scale at the same time of day   Complete by: As directed    Increase activity slowly   Complete by: As directed    Increase activity slowly   Complete by: As directed    STOP any activity that causes chest pain, shortness of breath, dizziness, sweating, or exessive weakness   Complete by: As directed       Allergies as of 07/20/2020       Reactions   Penicillins Hives        Medication List     TAKE these medications    albuterol 108 (90 Base) MCG/ACT inhaler Commonly known as: VENTOLIN HFA Inhale 2 puffs into the lungs every 6 (six) hours as needed for wheezing or shortness of breath.   digoxin 0.125 MG tablet Commonly known as: LANOXIN Take 1 tablet (0.125 mg total) by mouth daily. Start taking on: July 21, 2020   empagliflozin 10 MG Tabs tablet Commonly known as: JARDIANCE Take 1 tablet (10 mg total) by mouth daily. Start taking on: July 21, 2020   furosemide 40 MG tablet Commonly  known as: LASIX Take 1 tablet (40 mg total) by mouth daily. Start taking on: July 21, 2020   potassium chloride SA 20 MEQ tablet Commonly known as: KLOR-CON Take 1 tablet (20 mEq total) by mouth daily. Start taking on: July 21, 2020   sacubitril-valsartan 24-26 MG Commonly known as: ENTRESTO Take 1 tablet by mouth 2 (two) times daily.   spironolactone 25 MG tablet Commonly known as: ALDACTONE Take 1 tablet (  25 mg total) by mouth daily. Start taking on: July 21, 2020       Allergies  Allergen Reactions   Penicillins Hives    Follow-up Information     [facility]. Go on 09/27/2020.   Specialty: Internal Medicine Why: :00pm Contact information: [address] [place] [address] [phone]        [facility] Follow up on 08/03/2020.   Specialty: Cardiology Why: [facility] 9:30 am Entrance C  Garage Code [phone] Contact information: [address] [id number] Wilhemina Bonito [place] [address] [phone]                 The results of significant diagnostics from this hospitalization (including imaging, microbiology, ancillary and laboratory) are listed below for reference.    Significant Diagnostic Studies: DG Chest 2 View  Result Date: 07/16/2020 CLINICAL DATA:  50 year old male with shortness of breath. EXAM: CHEST - 2 VIEW COMPARISON:  None FINDINGS: Mild diffuse chronic interstitial coarsening and emphysema. No focal consolidation, pleural effusion, or pneumothorax. Mild cardiomegaly. No acute osseous pathology. IMPRESSION: No acute cardiopulmonary process. Electronically Signed   By: Elgie Collard M.D.   On: 07/16/2020 03:06   CT Angio Chest Pulmonary Embolism (PE) W or WO Contrast  Result Date: 07/16/2020 CLINICAL DATA:  Leg swelling.  Shortness of breath. EXAM: CT ANGIOGRAPHY CHEST WITH CONTRAST TECHNIQUE: Multidetector CT imaging of  the chest was performed using the standard protocol during bolus administration of intravenous contrast. Multiplanar CT image reconstructions and MIPs were obtained to evaluate the vascular anatomy. CONTRAST:  75mL OMNIPAQUE IOHEXOL 350 MG/ML SOLN COMPARISON:  None. FINDINGS: Cardiovascular: No filling defects in the pulmonary arteries to suggest pulmonary emboli. Heart is mildly enlarged. Aorta normal caliber. Mediastinum/Nodes: No mediastinal, hilar, or axillary adenopathy. Trachea and esophagus are unremarkable. Thyroid unremarkable. Lungs/Pleura: Biapical pulmonary nodules measuring up to 10 mm in the left apex and 9 mm in the right apex. Areas of apical and bibasilar scarring. Trace right pleural effusion. Upper Abdomen: Imaging into the upper abdomen demonstrates no acute findings. Musculoskeletal: Chest wall soft tissues are unremarkable. No acute bony abnormality. Review of the MIP images confirms the above findings. IMPRESSION: No evidence of pulmonary embolus. Cardiomegaly. Biapical and bibasilar scarring. Nodular densities in the upper lobes/apices bilaterally warrant follow-up. Non-contrast chest CT at 3-6 months is recommended. If the nodules are stable at time of repeat CT, then future CT at 18-24 months (from today's scan) is considered optional for low-risk patients, but is recommended for high-risk patients. This recommendation follows the consensus statement: Guidelines for Management of Incidental Pulmonary Nodules Detected on CT Images: From the Fleischner Society 2017; Radiology 2017; 284:228-243. Trace right pleural effusion. Electronically Signed   By: Charlett Nose M.D.   On: 07/16/2020 11:38   CARDIAC CATHETERIZATION  Result Date: 07/19/2020 Formatting of this result is different from the original.   There is severe left ventricular systolic dysfunction.   The left ventricular ejection fraction is less than 25% by visual estimate. Findings: Ao = 96/70 (81) LV = 90/8 RA =  1 RV = 29/2 PA =  29/8 (15) PCW = 5 Fick cardiac output/index = 4.1/2.1 PVR = 2.5 SVR = 1572 Ao sat = 96% PA sat = 62%, 63% Assessment: Normal coronary arteries Severe NICM EF 20-25% Low filling pressures with moderately reduced cardiac output Plan/Discussion: Medical therapy. Check cMRI. Arvilla Meres, MD 2:00 PM  ECHOCARDIOGRAM COMPLETE  Result Date: 07/16/2020    ECHOCARDIOGRAM REPORT   Patient Name:   Angeles Hass Date of Exam: 07/16/2020 Medical Rec #:  [id number]    Height:       70.0 in Accession #:    [id number]   Weight:       194.0 lb Date of Birth:  [birth year]    BSA:          2.060 m Patient Age:    50 years     BP:           119/90 mmHg Patient Gender: M            HR:           105 bpm. Exam Location:  Inpatient Procedure: 2D Echo, 3D Echo, Cardiac Doppler and Color Doppler REPORT CONTAINS CRITICAL RESULT                                 MODIFIED REPORT:     This report was modified by Riley Lam MD on 07/16/2020 due to                                Additional images.  Indications:     I50.40* Unspecified combined systolic (congestive) and                  diastolic (congestive) heart failure  History:         Patient has no prior history of Echocardiogram examinations.                  CHF, Signs/Symptoms:Shortness of Breath and Dyspnea; Risk                  Factors:Current Smoker.  Sonographer:     Sheralyn Boatman RDCS Referring Phys:  [id number] Meryle Ready [facility] Diagnosing Phys: Riley Lam MD  Sonographer Comments: Suboptimal parasternal window and suboptimal subcostal window. Patient in high fowler's position due to dyspnea. IMPRESSIONS  1. Left ventricular ejection fraction, by estimation, is <20%. The left ventricle has severely decreased function. The left ventricle demonstrates global hypokinesis. The left ventricular internal cavity size was moderately dilated. There is mild concentric left ventricular hypertrophy. Left ventricular diastolic parameters are consistent with Grade III diastolic  dysfunction (restrictive). Elevated left atrial pressure. Non Compaction Fraction < 2.3 my [name]. No LV thrombus.  2. Right ventricular systolic function is moderately reduced. The right ventricular size is moderately enlarged. There is moderately elevated pulmonary artery systolic pressure. The estimated right ventricular systolic pressure is 54.7 mmHg.  3. Left atrial size was severely dilated.  4. Right atrial size was moderately dilated.  5. Yezenia Fredrick small pericardial effusion is present.  6. The mitral valve is grossly normal. Mild to moderate mitral valve regurgitation- mechanism appears to be ventricular functional. No evidence of mitral stenosis.  7. Tricuspid valve regurgitation is mild to moderate- mechanisms appears to be ventricular functional.  8. The aortic valve was not well visualized. Aortic valve regurgitation is not visualized. No aortic stenosis is present.  9. Aortic dilatation noted. There is mild dilatation of the ascending aorta, measuring 41 mm. 10. The inferior vena cava is dilated in size with <50% respiratory variability, suggesting right atrial pressure of 15 mmHg. Comparison(s): No prior Echocardiogram. Conclusion(s)/Recommendation(s): New Biventricular dysfunction. Discussed with primary MD. Constrasted images do not meet  criteria for LV non-compaction or show LV thrombus. CMR could be considered in the future if clinically indicated. FINDINGS  Left Ventricle: Left ventricular ejection fraction, by estimation, is <20%. The left ventricle has severely decreased function. The left ventricle demonstrates global hypokinesis. The left ventricular internal cavity size was moderately dilated. There is mild concentric left ventricular hypertrophy. Left ventricular diastolic parameters are consistent with Grade III diastolic dysfunction (restrictive). Elevated left atrial pressure. Right Ventricle: The right ventricular size is moderately enlarged. Right vetricular wall thickness was not  well visualized. Right ventricular systolic function is moderately reduced. There is moderately elevated pulmonary artery systolic pressure. The tricuspid regurgitant velocity is 3.15 m/s, and with an assumed right atrial pressure of 15 mmHg, the estimated right ventricular systolic pressure is 54.7 mmHg. Left Atrium: Left atrial size was severely dilated. Right Atrium: Right atrial size was moderately dilated. Pericardium: Almadelia Looman small pericardial effusion is present. Mitral Valve: The mitral valve is grossly normal. Mild to moderate mitral valve regurgitation. No evidence of mitral valve stenosis. Tricuspid Valve: The tricuspid valve is grossly normal. Tricuspid valve regurgitation is mild to moderate. No evidence of tricuspid stenosis. Aortic Valve: The aortic valve was not well visualized. Aortic valve regurgitation is not visualized. No aortic stenosis is present. Pulmonic Valve: The pulmonic valve was not well visualized. Pulmonic valve regurgitation is not visualized. No evidence of pulmonic stenosis. Aorta: The aortic root is normal in size and structure and aortic dilatation noted. There is mild dilatation of the ascending aorta, measuring 41 mm. Venous: The inferior vena cava is dilated in size with less than 50% respiratory variability, suggesting right atrial pressure of 15 mmHg. IAS/Shunts: The atrial septum is grossly normal.  LEFT VENTRICLE PLAX 2D LVIDd:         6.60 cm      Diastology LVIDs:         6.10 cm      LV e' medial:    6.09 cm/s LV PW:         1.40 cm      LV E/e' medial:  21.3 LV IVS:        1.20 cm      LV e' lateral:   7.29 cm/s LVOT diam:     2.30 cm      LV E/e' lateral: 17.8 LV SV:         41 LV SV Index:   20 LVOT Area:     4.15 cm  LV Volumes (MOD) LV vol d, MOD A2C: 293.0 ml LV vol d, MOD A4C: 211.0 ml LV vol s, MOD A2C: 259.0 ml LV vol s, MOD A4C: 201.0 ml LV SV MOD A2C:     34.0 ml LV SV MOD A4C:     211.0 ml LV SV MOD BP:      25.1 ml RIGHT VENTRICLE RV S prime:     5.87 cm/s TAPSE  (M-mode): 1.1 cm LEFT ATRIUM              Index       RIGHT ATRIUM           Index LA diam:        2.80 cm  1.36 cm/m  RA Area:     23.70 cm LA Vol (A2C):   92.9 ml  45.09 ml/m RA Volume:   89.80 ml  43.58 ml/m LA Vol (A4C):   117.0 ml 56.78 ml/m LA Biplane Vol: 107.0 ml 51.93 ml/m  AORTIC VALVE LVOT  Vmax:   78.80 cm/s LVOT Vmean:  50.900 cm/s LVOT VTI:    0.099 m  AORTA Ao Root diam: 3.90 cm Ao Asc diam:  4.10 cm MITRAL VALVE                TRICUSPID VALVE MV Area (PHT): 5.73 cm     TR Peak grad:   39.7 mmHg MV Decel Time: 133 msec     TR Vmax:        315.00 cm/s MV E velocity: 129.50 cm/s                             SHUNTS                             Systemic VTI:  0.10 m                             Systemic Diam: 2.30 cm Riley Lam MD Electronically signed by Riley Lam MD Signature Date/Time: 07/16/2020/10:59:36 AM    Final (Updated)    VAS [place] LOWER EXTREMITY VENOUS (DVT)  Result Date: 07/16/2020  Lower Venous DVT Study Patient Name:  JEMMIE RHINEHART  Date of Exam:   07/16/2020 Medical Rec #: [id number]     Accession #:    [id number] Date of Birth: 02-13-1970     Patient Gender: M Patient Age:   50Y Exam Location:  [facility] Procedure:      VAS [place] LOWER EXTREMITY VENOUS (DVT) Referring Phys: [id number] Eduard Clos --------------------------------------------------------------------------------  Indications: Edema.  Limitations: Poor ultrasound/tissue interface. Comparison Study: No previous exams Performing Technologist: Jody Hill RVT, RDMS  Examination Guidelines: Analyse Angst complete evaluation includes B-mode imaging, spectral Doppler, color Doppler, and power Doppler as needed of all accessible portions of each vessel. Bilateral testing is considered an integral part of Dontre Laduca complete examination. Limited examinations for reoccurring indications may be performed as noted. The reflux portion of the exam is performed with the patient in reverse Trendelenburg.   +---------+---------------+---------+-----------+----------+--------------+ RIGHT    CompressibilityPhasicitySpontaneityPropertiesThrombus Aging +---------+---------------+---------+-----------+----------+--------------+ CFV      Full           Yes      Yes                                 +---------+---------------+---------+-----------+----------+--------------+ SFJ      Full                                                        +---------+---------------+---------+-----------+----------+--------------+ FV Prox  Full           Yes      Yes                                 +---------+---------------+---------+-----------+----------+--------------+ FV Mid   Full           Yes      Yes                                 +---------+---------------+---------+-----------+----------+--------------+  FV DistalFull           Yes      Yes                                 +---------+---------------+---------+-----------+----------+--------------+ PFV      Full                                                        +---------+---------------+---------+-----------+----------+--------------+ POP      Full           Yes      Yes                                 +---------+---------------+---------+-----------+----------+--------------+ PTV      Full                                                        +---------+---------------+---------+-----------+----------+--------------+ PERO     Full                                                        +---------+---------------+---------+-----------+----------+--------------+   +---------+---------------+---------+-----------+----------+--------------+ LEFT     CompressibilityPhasicitySpontaneityPropertiesThrombus Aging +---------+---------------+---------+-----------+----------+--------------+ CFV      Full           Yes      Yes                                  +---------+---------------+---------+-----------+----------+--------------+ SFJ      Full                                                        +---------+---------------+---------+-----------+----------+--------------+ FV Prox  Full           Yes      Yes                                 +---------+---------------+---------+-----------+----------+--------------+ FV Mid   Full           Yes      Yes                                 +---------+---------------+---------+-----------+----------+--------------+ FV DistalFull           Yes      Yes                                 +---------+---------------+---------+-----------+----------+--------------+ PFV      Full                                                        +---------+---------------+---------+-----------+----------+--------------+  POP      Full           Yes      Yes                                 +---------+---------------+---------+-----------+----------+--------------+ PTV      Full                                                        +---------+---------------+---------+-----------+----------+--------------+ PERO     Full                                                        +---------+---------------+---------+-----------+----------+--------------+     Summary: BILATERAL: - No evidence of deep vein thrombosis seen in the lower extremities, bilaterally. - No evidence of superficial venous thrombosis in the lower extremities, bilaterally. -No evidence of popliteal cyst, bilaterally. -Subcutaneous edema of BLE.  LEFT: - Ultrasound characteristics of enlarged lymph nodes noted in the groin.  *See table(s) above for measurements and observations. Electronically signed by Sherald Hesshristopher Clark MD on 07/16/2020 at 2:50:12 PM.    Final     Microbiology: Recent Results (from the past 240 hour(s))  Resp Panel by RT-PCR (Flu Atharva Mirsky&B, Covid) Nasopharyngeal Swab     Status: None   Collection Time: 07/16/20   2:23 AM   Specimen: Nasopharyngeal Swab; Nasopharyngeal(NP) swabs in vial transport medium  Result Value Ref Range Status   SARS Coronavirus 2 by RT PCR NEGATIVE NEGATIVE Final    Comment: (NOTE) SARS-CoV-2 target nucleic acids are NOT DETECTED.  The SARS-CoV-2 RNA is generally detectable in upper respiratory specimens during the acute phase of infection. The lowest concentration of SARS-CoV-2 viral copies this assay can detect is 138 copies/mL. Ogden Handlin negative result does not preclude SARS-Cov-2 infection and should not be used as the sole basis for treatment or other patient management decisions. Laymon Stockert negative result may occur with  improper specimen collection/handling, submission of specimen other than nasopharyngeal swab, presence of viral mutation(s) within the areas targeted by this assay, and inadequate number of viral copies(<138 copies/mL). Cassie Henkels negative result must be combined with clinical observations, patient history, and epidemiological information. The expected result is Negative.  Fact Sheet for Patients:  BloggerCourse.comhttps://www.fda.gov/media/152166/download  Fact Sheet for Healthcare Providers:  SeriousBroker.ithttps://www.fda.gov/media/152162/download  This test is no t yet approved or cleared by the [place] FDA and  has been authorized for detection and/or diagnosis of SARS-CoV-2 by FDA under an Emergency Use Authorization (EUA). This EUA will remain  in effect (meaning this test can be used) for the duration of the COVID-19 declaration under Section 564(b)(1) of the Act, 21 U.S.C.section 360bbb-3(b)(1), unless the authorization is terminated  or revoked sooner.       Influenza Kile Kabler by PCR NEGATIVE NEGATIVE Final   Influenza B by PCR NEGATIVE NEGATIVE Final    Comment: (NOTE) The Xpert Xpress SARS-CoV-2/FLU/RSV plus assay is intended as an aid in the diagnosis of influenza from Nasopharyngeal swab specimens and should not be used as Braison Snoke sole basis for treatment. Nasal washings and aspirates  are unacceptable for Xpert Xpress  SARS-CoV-2/FLU/RSV testing.  Fact Sheet for Patients: BloggerCourse.com  Fact Sheet for Healthcare Providers: SeriousBroker.it  This test is not yet approved or cleared by the [place] FDA and has been authorized for detection and/or diagnosis of SARS-CoV-2 by FDA under an Emergency Use Authorization (EUA). This EUA will remain in effect (meaning this test can be used) for the duration of the COVID-19 declaration under Section 564(b)(1) of the Act, 21 U.S.C. section 360bbb-3(b)(1), unless the authorization is terminated or revoked.  Performed at [facility], [address], [place], [place] [address]      Labs: Basic Metabolic Panel: Recent Labs  Lab 07/16/20 0412 07/16/20 0926 07/17/20 0341 07/18/20 0239 07/19/20 0341 07/19/20 1350 07/20/20 0224  NA 137  --  135 138 136 143 136  K 3.3*  --  3.7 4.1 4.5 3.8 4.5  CL 104  --  95* 95* 102  --  103  CO2 25  --  33* 36* 24  --  28  GLUCOSE 93  --  103* 120* 103*  --  106*  BUN 24*  --  --  18  CREATININE 0.96 1.10 1.01 0.96 1.23  --  0.99  CALCIUM 7.6*  --  8.3* 8.6* 8.8*  --  8.9  MG  --   --  1.8 2.2 2.3  --  2.3  PHOS  --   --   --  3.7 4.0  --  4.4   Liver Function Tests: Recent Labs  Lab 07/16/20 0412 07/18/20 0239 07/19/20 0341 07/20/20 0224  AST 39 ALT 42 38 36 35  ALKPHOS 67 69 72 78  BILITOT 0.8 0.7 0.7 0.6  PROT 5.6* 6.1* 6.3* 6.3*  ALBUMIN 3.2* 3.2* 3.3* 3.2*   No results for input(s): LIPASE, AMYLASE in the last 168 hours. No results for input(s): AMMONIA in the last 168 hours. CBC: Recent Labs  Lab 07/16/20 0926 07/17/20 0341 07/18/20 0239 07/19/20 0341 07/19/20 1350 07/20/20 0224  WBC 10.6* 7.5 8.2 6.4  --  6.0  NEUTROABS  --   --  6.1 4.2  --  3.0  HGB 11.1* 11.1* 11.8* 13.6 13.6 14.7  HCT 34.5* 34.3* 36.4* 43.6 40.0 45.5  MCV 96.6 95.0 95.8 98.2  --  95.6   PLT 286 265 316 347  --  376   Cardiac Enzymes: No results for input(s): CKTOTAL, CKMB, CKMBINDEX, TROPONINI in the last 168 hours. BNP: BNP (last 3 results) Recent Labs    07/16/20 0223  BNP 564.0*    ProBNP (last 3 results) No results for input(s): PROBNP in the last 8760 hours.  CBG: Recent Labs  Lab 07/18/20 2110  GLUCAP 110*       Signed:  Lacretia Nicks MD.  [facility] 07/20/2020, 4:22 PM

## 2020-07-20 NOTE — Progress Notes (Addendum)
Advanced Heart Failure Rounding Note   Subjective:     Cardiac MRI pending.  Weight up 1lb today. Down ~ 30 lb since admit. No dyspnea. Some wheezing which he attributes to asthma. Denies chest pain.    CARDIAC STUDIES: R/LHC, 07/19/20 Assessment: Normal coronary arteries Severe NICM EF 20-25% Low filling pressures with moderately reduced cardiac output  Echo, 07/16/20: EF < 20, moderately dilated LV, mild LVH, enlarged RV with moderately reduced RV function, RVSP 54 mmHg, severe LAE, mild to moderate MR/TR       Objective:   Weight Range:  Vital Signs:   Temp:  [97.8 F (36.6 C)-99.4 F (37.4 C)] 97.8 F (36.6 C) (07/19 1115) Pulse Rate:  [98-111] 103 (07/19 1115) Resp:  [18-20] 20 (07/19 1115) BP: (92-111)/(54-85) 110/85 (07/19 1115) SpO2:  [92 %-99 %] 99 % (07/19 1115) Weight:  [74.8 kg] 74.8 kg (07/19 0042) Last BM Date: 07/19/20  Weight change: Filed Weights   07/18/20 0419 07/19/20 0338 07/20/20 0042  Weight: 79.2 kg 74.6 kg 74.8 kg    Intake/Output:   Intake/Output Summary (Last 24 hours) at 07/20/2020 1359 Last data filed at 07/20/2020 1316 Gross per 24 hour  Intake 2463.1 ml  Output 3815 ml  Net -1351.9 ml     PHYSICAL EXAM: General:  Thin WM in no acute distress. HEENT: normal Neck: supple. no JVD. Carotids 2+ bilat; no bruits. No lymphadenopathy or thyromegaly appreciated. Cor: PMI nondisplaced. Regular rhythm, tachycardic. No rubs, gallops or murmurs. Lungs: Expiratory wheezes throughout Abdomen: soft, nontender, nondistended. No hepatosplenomegaly. No bruits or masses. Good bowel sounds. Extremities: no cyanosis, clubbing, rash, no edema Neuro: alert & oriented x 3, cranial nerves grossly intact. moves all 4 extremities w/o difficulty. Affect pleasant.   Telemetry: Sinus tachycardia, 100s-110  Labs: Basic Metabolic Panel: Recent Labs  Lab 07/16/20 0412 07/16/20 0926 07/17/20 0341 07/18/20 0239 07/19/20 0341 07/19/20 1350  07/20/20 0224  NA 137  --  135 138 136 143 136  K 3.3*  --  3.7 4.1 4.5 3.8 4.5  CL 104  --  95* 95* 102  --  103  CO2 25  --  33* 36* 24  --  28  GLUCOSE 93  --  103* 120* 103*  --  106*  BUN 24*  --  17 13 16   --  18  CREATININE 0.96 1.10 1.01 0.96 1.23  --  0.99  CALCIUM 7.6*  --  8.3* 8.6* 8.8*  --  8.9  MG  --   --  1.8 2.2 2.3  --  2.3  PHOS  --   --   --  3.7 4.0  --  4.4    Liver Function Tests: Recent Labs  Lab 07/16/20 0412 07/18/20 0239 07/19/20 0341 07/20/20 0224  AST 39 27 28 27   ALT 42 38 36 35  ALKPHOS 67 69 72 78  BILITOT 0.8 0.7 0.7 0.6  PROT 5.6* 6.1* 6.3* 6.3*  ALBUMIN 3.2* 3.2* 3.3* 3.2*   No results for input(s): LIPASE, AMYLASE in the last 168 hours. No results for input(s): AMMONIA in the last 168 hours.  CBC: Recent Labs  Lab 07/16/20 0926 07/17/20 0341 07/18/20 0239 07/19/20 0341 07/19/20 1350 07/20/20 0224  WBC 10.6* 7.5 8.2 6.4  --  6.0  NEUTROABS  --   --  6.1 4.2  --  3.0  HGB 11.1* 11.1* 11.8* 13.6 13.6 14.7  HCT 34.5* 34.3* 36.4* 43.6 40.0 45.5  MCV 96.6 95.0 95.8  98.2  --  95.6  PLT 286 265 316 347  --  376    Cardiac Enzymes: No results for input(s): CKTOTAL, CKMB, CKMBINDEX, TROPONINI in the last 168 hours.  BNP: BNP (last 3 results) Recent Labs    07/16/20 0223  BNP 564.0*    ProBNP (last 3 results) No results for input(s): PROBNP in the last 8760 hours.    Other results:  Imaging: CARDIAC CATHETERIZATION  Result Date: 07/19/2020 Formatting of this result is different from the original.   There is severe left ventricular systolic dysfunction.   The left ventricular ejection fraction is less than 25% by visual estimate. Findings: Ao = 96/70 (81) LV = 90/8 RA =  1 RV = 29/2 PA = 29/8 (15) PCW = 5 Fick cardiac output/index = 4.1/2.1 PVR = 2.5 SVR = 1572 Ao sat = 96% PA sat = 62%, 63% Assessment: Normal coronary arteries Severe NICM EF 20-25% Low filling pressures with moderately reduced cardiac output  Plan/Discussion: Medical therapy. Check cMRI. Arvilla Meres, MD 2:00 PM    Medications:     Scheduled Medications:  digoxin  0.125 mg Oral Daily   doxycycline  100 mg Oral Q12H   empagliflozin  10 mg Oral Daily   enoxaparin (LOVENOX) injection  40 mg Subcutaneous Q24H   magnesium oxide  400 mg Oral Daily   sacubitril-valsartan  1 tablet Oral BID   sodium chloride flush  3 mL Intravenous Q12H   spironolactone  25 mg Oral Daily    Infusions:  sodium chloride      PRN Medications: sodium chloride, acetaminophen, acetaminophen, albuterol, ondansetron (ZOFRAN) IV, sodium chloride flush   Assessment/Plan   1. Acute systolic HF - Echo 07/16/20 EF < 20% RV moderately down mild to mod MR/TR.  -Cath with normal coronaries, low filling pressures and reduced cardiac output. Cardiac MRI pending. - Suspect NICM possibly due to OTC stimulants (he does not use meth). There is some hyper trabeculation in the LV cavity but not felt to be consistent with LV noncompaction. SPEP/UPEP with immunofixation pending. - Diuresed ~ 30 lb since admit. Diuretic held last 2 days. Appears compensated. Recommend 40 mg furosemide daily at discharge.  - Continue Entresto 24-26 mg twice daily. BP too soft to titrate  - No beta-blocker given acute decompensation - Continue digoxin 0.125 mg daily. - Continue spiro 25 mg daily. - Continue Jardiance 10 mg daily  2. Left leg erythema - Concern for cellulitis -- started on doxycycline by primary team - [place] negative for DVT - Improved   3. Tobacco use - smoking cessation encouraged     SDOH -Very concerned about ability to afford meds. PharmD assisting. Obtaining meds through [facility] via HF fund. -TOC assisting with PCP through [facility] -Friend will provide transportation to appointments.   Okay for discharge from cardiac perspective.  F/u in HF clinic scheduled Meds at D/C: Entresto 24/26 mg BID Spiro 25 mg daily Jardiance 10 mg daily Digoxin 0.125  mg daily Furosemide 40 mg daily Potassium 20 mEq daily  Length of Stay: 4   FINCH, LINDSAY N PA-C  07/20/2020, 1:59 PM  Advanced Heart Failure Team Pager [phone] (M-F; 7a - 4p)  Please contact [facility] for night-coverage after hours (4p -7a ) and weekends on [email or web address]   Patient seen and examined with the above-signed Advanced Practice Provider and/or Housestaff. I personally reviewed laboratory data, imaging studies and relevant notes. I independently examined the patient and formulated the important aspects of the  plan. I have edited the note to reflect any of my changes or salient points. I have personally discussed the plan with the patient and/or family.  Feels great. Denies CP or SOB. Eager to go home   General:  Well appearing. No resp difficulty HEENT: normal Neck: supple. no JVD. Carotids 2+ bilat; no bruits. No lymphadenopathy or thryomegaly appreciated. Cor: PMI nondisplaced. Regular rate & rhythm. No rubs, gallops or murmurs. Lungs: clear Abdomen: soft, nontender, nondistended. No hepatosplenomegaly. No bruits or masses. Good bowel sounds. Extremities: no cyanosis, clubbing, rash, edema Neuro: alert & orientedx3, cranial nerves grossly intact. moves all 4 extremities w/o difficulty. Affect pleasant  Much improved. Volume status looks great. Results of cath reviewed with him. Meds discussed in detail. Discussed with PharmD who explained med assistance plan in detail. Awaiting results of cMRI. Stressed need to avoid stimulants.   Total time spent 35 minutes. Over half that time spent discussing above. Plan discussed with his roommate Casimiro Needle as well.   Arvilla Meres, MD  10:31 PM

## 2020-07-20 NOTE — Plan of Care (Signed)

## 2020-07-20 NOTE — TOC Progression Note (Addendum)
Transition of Care [facility]) - Progression Note    Patient Details  Name: Ravi Tuccillo MRN: [id number] Date of Birth: [birth year]  Transition [facility]) CM/SW Contact  Leone Haven, RN Phone Number: 07/20/2020, 12:15 PM  Clinical Narrative:    NCM spoke with patient, he states he does not have insurance or PCP.  Secretary will make a follow up apt for him with a [facility].  He states he lives with a friend Leretha Pol, who will transport him to a MD apt if he has one.  Patient states he has not been taking his medications because he does not have insurance.  NCM left message for a financial counselor to contact this patient regardng Medicaid.  Patient states he has a nebulizer and a scale at home but he does not have a bp cuff.  He will need ast with medications at discharge.  The HF team is following this patient also, HF pharmacy will ast with Jardiance and entresto, they have started the patient ast app for the [facility] for patient.  HF CSW following.  Match Letter done on 7/19.       Expected Discharge Plan and Services                                                 Social Determinants of Health (SDOH) Interventions    Readmission Risk Interventions No flowsheet data found.

## 2020-07-20 NOTE — TOC Transition Note (Signed)
Transition of Care [facility]) - CM/SW Discharge Note   Patient Details  Name: Laymond Postle MRN: [id number] Date of Birth: 1970/05/31  Transition of Care [facility]) CM/SW Contact:  Leone Haven, RN Phone Number: 07/20/2020, 4:57 PM   Clinical Narrative:    NCM spoke with patient, he states he does not have insurance or PCP.  Secretary will make a follow up apt for him with a [facility].  He states he lives with a friend Leretha Pol, who will transport him to a MD apt if he has one.  Patient states he has not been taking his medications because he does not have insurance.  NCM left message for a financial counselor to contact this patient regardng Medicaid.  Patient states he has a nebulizer and a scale at home but he does not have a bp cuff.  He will need ast with medications at discharge.  The HF team is following this patient also, HF pharmacy will ast with Jardiance and entresto, they have started the patient ast app for the [facility] for patient.  HF CSW following.  Match Letter done on 7/19.     Final next level of care: Home/Self Care Barriers to Discharge: No Barriers Identified   Patient Goals and CMS Choice        Discharge Placement                       Discharge Plan and Services                                     Social Determinants of Health (SDOH) Interventions Food Insecurity Interventions: Other (Comment) (Pt. reports receiving $250 a month in Sales executive) Financial Strain Interventions: Artist Housing Interventions: Intervention Not Indicated Transportation Interventions: Retail banker   Readmission Risk Interventions No flowsheet data found.

## 2020-07-21 ENCOUNTER — Other Ambulatory Visit (HOSPITAL_COMMUNITY): Payer: Self-pay

## 2020-07-23 ENCOUNTER — Telehealth (HOSPITAL_COMMUNITY): Payer: Self-pay | Admitting: Pharmacy Technician

## 2020-07-23 NOTE — Telephone Encounter (Signed)
Advanced Heart Failure Patient Advocate Encounter  Sent in [employer] application via efax.  Will follow up.

## 2020-07-28 NOTE — Telephone Encounter (Signed)
Advanced Heart Failure Patient Advocate Encounter   Patient was approved to receive Entresto from [employer]  Patient ID: [id number] Effective dates: 07/27/20 through 07/27/21  Called and spoke with the patient. Provided [employer] number, [phone].  Archer Asa, CPhT

## 2020-08-02 NOTE — Progress Notes (Addendum)
Advanced Heart Failure Clinic Note    PCP: Pcp, No HF Cardiologist: Dr. Gala Romney  HPI: Caleb Landry is a 50 y.o. male with history of asthma, tobacco abuse, and a new diagnosis of systolic heart failure.  Patient presented to [facility] [place] for evaluation of worsening shortness of breath, orthopnea and edema.  Admitted by hospital medicine for acute CHF exacerbation and started on lasix 40 mg IV bid. Echo obtained showing LVEF ~20% with global HK, G3DD, prominent LV. Cardiology and AHF team consulted to assist with management. Ultimately down 30 lbs after large volume diuresis. He underwent R/LHC showing normal cors, severe NICM EF 20-25%, low filling pressures with moderately reduced CO. Diuretic held for 2 days then resumed lasix 40 mg.  He was started on GDMT. CMRI and SPEP/UPEP ordered for amyloid work up. Suspect NICM possibly due to OTC stimulants (he does not use meth). Hospitalization complicated by lower extremity swelling w/ concern for cellulitis; treated with doxycycline. He was discharged on Entresto 24/26, dig, spiro 25, Jardiance 10, and lasix 40 daily. His discharge weight 165 was lbs.   Today he returns for post hospitalization HF follow up with his partner. Overall feeling good. Has some fatigue & SOB with walking further distances in heat. Denies CP, dizziness, edema, or PND/Orthopnea. Appetite ok. Weight at home 158 pounds. Taking all medications.    CARDIAC STUDIES:  - Echo (07/16/20): EF < 20, moderately dilated LV, mild LVH, enlarged RV with moderately reduced RV function, RVSP 54 mmHg, severe LAE, mild to moderate MR/TR  - R/LHC, 07/19/20: normal cors, severe NICM EF 20-25%, low filling pressures w/ mod reduced CO   Ao = 96/70 (81) LV = 90/8 RA =  1 RV = 29/2 PA = 29/8 (15) PCW = 5 Fick cardiac output/index = 4.1/2.1 PVR = 2.5 SVR = 1572 Ao sat = 96% PA sat = 62%, 63%  Review of Systems: [y] = yes, [ ]  = no   General: Weight gain [ ] ; Weight loss [ ] ; Anorexia [ ] ;  Fatigue [y]; Fever [ ] ; Chills [ ] ; Weakness [ ]   Cardiac: Chest pain/pressure [ ] ; Resting SOB [ ] ; Exertional SOB [ y]; Orthopnea [ ] ; Pedal Edema [ ] ; Palpitations [ ] ; Syncope [ ] ; Presyncope [ ] ; Paroxysmal nocturnal dyspnea[ ]   Pulmonary: Cough [ ] ; Wheezing[ ] ; Hemoptysis[ ] ; Sputum [ ] ; Snoring [ ]   GI: Vomiting[ ] ; Dysphagia[ ] ; Melena[ ] ; Hematochezia [ ] ; Heartburn[ ] ; Abdominal pain [ ] ; Constipation [ ] ; Diarrhea [ ] ; BRBPR [ ]   GU: Hematuria[ ] ; Dysuria [ ] ; Nocturia[ ]   Vascular: Pain in legs with walking [ ] ; Pain in feet with lying flat [ ] ; Non-healing sores [ ] ; Stroke [ ] ; TIA [ ] ; Slurred speech [ ] ;  Neuro: Headaches[ ] ; Vertigo[ ] ; Seizures[ ] ; Paresthesias[ ] ;Blurred vision [ ] ; Diplopia [ ] ; Vision changes [ ]   Ortho/Skin: Arthritis [ ] ; Joint pain ]; Muscle pain [ ] ; Joint swelling [ ] ; Back Pain [ ] ; Rash [ ]   Psych: Depression[ ] ; Anxiety[ ]   Heme: Bleeding problems [ ] ; Clotting disorders [ ] ; Anemia [ ]   Endocrine: Diabetes [ ] ; Thyroid dysfunction[ ]    Past Medical History:  Diagnosis Date   Asthma    Neuropathy    Current Outpatient Medications  Medication Sig Dispense Refill   albuterol (VENTOLIN HFA) 108 (90 Base) MCG/ACT inhaler Inhale 2 puffs into the lungs every 6 (six) hours as needed for wheezing or shortness of breath. 8.5  g 2   digoxin (LANOXIN) 0.125 MG tablet Take 1 tablet (0.125 mg total) by mouth daily. 30 tablet 1   empagliflozin (JARDIANCE) 10 MG TABS tablet Take 1 tablet (10 mg total) by mouth daily. 30 tablet 1   furosemide (LASIX) 40 MG tablet Take 1 tablet (40 mg total) by mouth daily. 30 tablet 1   potassium chloride SA (KLOR-CON) 20 MEQ tablet Take 1 tablet (20 mEq total) by mouth daily. 30 tablet 0   sacubitril-valsartan (ENTRESTO) 24-26 MG Take 1 tablet by mouth 2 (two) times daily. 60 tablet 1   spironolactone (ALDACTONE) 25 MG tablet Take 1 tablet (25 mg total) by mouth daily. 30 tablet 1   No current facility-administered  medications for this encounter.   Allergies  Allergen Reactions   Penicillins Hives   Social History   Socioeconomic History   Marital status: Widowed    Spouse name: Not on file   Number of children: Not on file   Years of education: Not on file   Highest education level: Not on file  Occupational History   Not on file  Tobacco Use   Smoking status: Every Day    Packs/day: 0.50    Years: 30.00    Pack years: 15.00    Types: Cigarettes   Smokeless tobacco: Never  Substance and Sexual Activity   Alcohol use: Not Currently   Drug use: Not Currently   Sexual activity: Not on file  Other Topics Concern   Not on file  Social History Narrative   Not on file   Social Determinants of Health   Financial Resource Strain: High Risk   Difficulty of Paying Living Expenses: Hard  Food Insecurity: Food Insecurity Present   Worried About Running Out of Food in the Last Year: Sometimes true   Ran Out of Food in the Last Year: Sometimes true  Transportation Needs: Unmet Transportation Needs   Lack of Transportation (Medical): No   Lack of Transportation (Non-Medical): Yes  Physical Activity: Not on file  Stress: Not on file  Social Connections: Not on file  Intimate Partner Violence: Not on file   Family History  Problem Relation Age of Onset   Crohn's disease Mother    BP 97/60   Pulse (!) 106   Wt 75.7 kg (166 lb 12.8 oz)   SpO2 96%   BMI 23.93 kg/m   Wt Readings from Last 3 Encounters:  08/03/20 75.7 kg (166 lb 12.8 oz)  07/20/20 74.8 kg (165 lb)   PHYSICAL EXAM: General:  NAD. No resp difficulty, thin HEENT: Normal Neck: Supple. No JVD. Carotids 2+ bilat; no bruits. No lymphadenopathy or thryomegaly appreciated. Cor: PMI nondisplaced. Regular rate & rhythm. No rubs, gallops or murmurs. Lungs: Exp wheeze RUL Abdomen: Soft, nontender, nondistended. No hepatosplenomegaly. No bruits or masses. Good bowel sounds. Extremities: No cyanosis, clubbing, rash, edema;  bilateral LE varicosities. Neuro: Alert & oriented x 3, cranial nerves grossly intact. Moves all 4 extremities w/o difficulty. Tearful.  ECG: ST 102 bpm, qrs 96 ms (personally reviewed).  ASSESSMENT & PLAN: 1. Chronic Systolic HF - Echo (07/16/20): EF < 20% RV moderately down mild to mod MR/TR.  - Cath with normal coronaries, low filling pressures and reduced cardiac output. Cardiac MRI pending. - Suspect NICM possibly due to OTC stimulants (he does not use meth). There is some hyper trabeculation in the LV cavity but not felt to be consistent with LV noncompaction.  - SPEP negative. - cMRI (7/22): Severe systolic  dysfunction EF 13%, severe RV systolic dysfunction RVEF 21%, mild MR/TR. - NYHA II, volume good today. - Continue lasix 40 mg daily. - Continue Entresto 24-26 mg bid. BP too soft to titrate - Continue digoxin 0.125 mg daily. - Continue spiro 25 mg daily. - Continue Jardiance 10 mg daily. - No beta-blocker yet w/ recent decompensation. - No indication for Life Vest at this time (personally discussed with Dr. Gala Romney). - BMET and dig level today.   2. Left leg erythema - Resolved. - Completed doxycycline. - [place] negative for DVT.   3. Tobacco use - Cut back to 4-5 cigs/day. - Encouraged cessation, says he plans to quit soon.  Follow up in 3 weeks with PharmD for further medication titration and 6-8 weeks with APP.  Anderson Malta [place], FNP 08/03/20

## 2020-08-03 ENCOUNTER — Other Ambulatory Visit: Payer: Self-pay

## 2020-08-03 ENCOUNTER — Other Ambulatory Visit (HOSPITAL_COMMUNITY): Payer: Self-pay

## 2020-08-03 ENCOUNTER — Ambulatory Visit (HOSPITAL_COMMUNITY)
Admission: RE | Admit: 2020-08-03 | Discharge: 2020-08-03 | Disposition: A | Discharge status: Home / Self Care | Payer: Self-pay | Source: Ambulatory Visit | Attending: Family Medicine | Admitting: Family Medicine

## 2020-08-03 ENCOUNTER — Encounter (HOSPITAL_COMMUNITY): Payer: Self-pay

## 2020-08-03 VITALS — BP 97/60 | HR 106 | Wt 166.8 lb

## 2020-08-03 DIAGNOSIS — Z880 Allergy status to penicillin: Secondary | ICD-10-CM | POA: Insufficient documentation

## 2020-08-03 DIAGNOSIS — Z5941 Food insecurity: Secondary | ICD-10-CM | POA: Insufficient documentation

## 2020-08-03 DIAGNOSIS — L539 Erythematous condition, unspecified: Secondary | ICD-10-CM

## 2020-08-03 DIAGNOSIS — J45909 Unspecified asthma, uncomplicated: Secondary | ICD-10-CM | POA: Insufficient documentation

## 2020-08-03 DIAGNOSIS — Z596 Low income: Secondary | ICD-10-CM | POA: Insufficient documentation

## 2020-08-03 DIAGNOSIS — I5022 Chronic systolic (congestive) heart failure: Secondary | ICD-10-CM | POA: Insufficient documentation

## 2020-08-03 DIAGNOSIS — I5032 Chronic diastolic (congestive) heart failure: Secondary | ICD-10-CM

## 2020-08-03 DIAGNOSIS — Z79899 Other long term (current) drug therapy: Secondary | ICD-10-CM | POA: Insufficient documentation

## 2020-08-03 DIAGNOSIS — Z720 Tobacco use: Secondary | ICD-10-CM

## 2020-08-03 DIAGNOSIS — F17210 Nicotine dependence, cigarettes, uncomplicated: Secondary | ICD-10-CM | POA: Insufficient documentation

## 2020-08-03 LAB — BASIC METABOLIC PANEL
Anion gap: 6 (ref 5–15)
BUN: 17 mg/dL (ref 6–20)
CO2: 31 mmol/L (ref 22–32)
Calcium: 9.2 mg/dL (ref 8.9–10.3)
Chloride: 96 mmol/L — ABNORMAL LOW (ref 98–111)
Creatinine, Ser: 1.07 mg/dL (ref 0.61–1.24)
GFR, Estimated: >60 mL/min (ref >60)
Glucose, Bld: 96 mg/dL (ref 70–99)
Potassium: 4.6 mmol/L (ref 3.5–5.1)
Sodium: 133 mmol/L — ABNORMAL LOW (ref 135–145)

## 2020-08-03 LAB — DIGOXIN LEVEL: Digoxin Level: 0.2 ng/mL — ABNORMAL LOW (ref 0.8–2.0)

## 2020-08-03 MED ORDER — EMPAGLIFLOZIN 10 MG PO TABS
10.0000 mg | ORAL_TABLET | Freq: Every day | ORAL | 1 refills | Status: DC
  Filled 2020-08-03: qty 30, 30d supply, fill #0

## 2020-08-03 NOTE — Patient Instructions (Addendum)
Jardiance has bee refilled with the HF fund  Labs today We will only contact you if something comes back abnormal or we need to make some changes. Otherwise no news is good news!   Your physician recommends that you schedule a follow-up appointment in: 3 weeks with the pharmacy team and in 6 weeks  in the Advanced Practitioners (PA/NP) Clinic    Do the following things EVERYDAY: Weigh yourself in the morning before breakfast. Write it down and keep it in a log. Take your medicines as prescribed Eat low salt foods--Limit salt (sodium) to 2000 mg per day.  Stay as active as you can everyday Limit all fluids for the day to less than 2 liters  milAt the [facility], you and your health needs are our priority. As part of our continuing mission to provide you with exceptional heart care, we have created designated Provider Care Teams. These Care Teams include your primary Cardiologist (physician) and Advanced Practice Providers (APPs- Physician Assistants and Nurse Practitioners) who all work together to provide you with the care you need, when you need it.   You may see any of the following providers on your designated Care Team at your next follow up: Dr Arvilla Meres Dr Marca Ancona Dr Brandon Melnick, NP Robbie Lis, [place] Mikki Santee Karle Plumber, PharmD   Please be sure to bring in all your medications bottles to every appointment.

## 2020-08-03 NOTE — Addendum Note (Signed)
Encounter addended by: Jacklynn Ganong, FNP on: 08/03/2020 1:39 PM  Actions taken: Clinical Note Signed

## 2020-08-06 ENCOUNTER — Other Ambulatory Visit (HOSPITAL_COMMUNITY): Payer: Self-pay

## 2020-08-10 NOTE — Progress Notes (Signed)
PCP: Pcp, No HF Cardiologist: Dr. Gala Romney  HPI:  Caleb Landry is a 50 y.o. male with history of asthma, tobacco abuse, and a new diagnosis of systolic heart failure.   Patient presented to [facility] for evaluation of worsening shortness of breath, orthopnea and edema.  Admitted by hospital medicine for acute CHF exacerbation and started on furosemide 40 mg IV BID. Echo obtained showing LVEF ~20% with global HK, G3DD, prominent LV. Cardiology and AHF team consulted to assist with management. Ultimately down 30 lbs after large volume diuresis. He underwent R/LHC showing normal cors, severe NICM EF 20-25%, low filling pressures with moderately reduced CO. Diuretic held for 2 days then resumed furosemide 40 mg.  He was started on GDMT. CMRI and SPEP/UPEP ordered for amyloid work up. Suspect NICM possibly due to OTC stimulants (he does not use meth). Hospitalization complicated by lower extremity swelling w/ concern for cellulitis; treated with doxycycline. He was discharged on Entresto 24/26 mg, digoxin, spironolactone 25 mg, Jardiance 10 mg, and furoseimde 40 daily. His discharge weight 165 was lbs.    He recently returned for post hospitalization HF follow up with his partner on 08/03/20. Overall was feeling good. Had some fatigue & SOB with walking further distances in heat. Denied CP, dizziness, edema, or PND/Orthopnea. Appetite was ok. Weight at home was 158 pounds. Reported taking all medications.  Today he returns to HF clinic for pharmacist medication titration. At last visit with APP, no medication changes were made due to low BP in clinic. Overall he is feeling well today. Notes he is feeling better but still gets worn out if he does too much. He has quit smoking, hasn't had a cigarette in 2 weeks. Still drinking soda and coffee, but drinking "significantly less". No dizziness, lightheadedness, chest pain or palpitations. Weight up 4 lbs from last clinic visit. Believes he is retaining fluid because he  has been out of all medications except Entresto since last Thursday. No LEE, PND or orthopnea.     HF Medications: - Note, out of all medications since last Thursday except Entresto as that comes from [employer] [facility].  Entresto 24/26 mg BID Spironolactone 25 mg daily Jardiance 10 mg daily Digoxin 0.125 mg daily Furosemide 40 mg daily Potassium chloride 20 mEq daily  Has the patient been experiencing any side effects to the medications prescribed?  no  Does the patient have any problems obtaining medications due to transportation or finances?   No insurance. Recieves Entresto through [employer] [facility]. Will apply for Jardiance patient assistance through [facility] today. Rest of heart failure medications sent to [facility] to use HF Fund.   Understanding of regimen: fair Understanding of indications: fair Potential of compliance: fair Patient understands to avoid NSAIDs. Patient understands to avoid decongestants.    Pertinent Lab Values: 08/03/20: Serum creatinine 1.07, BUN 17, Potassium 4.6, Sodium 133  Vital Signs: Weight: 170.2 lbs (last clinic weight: 166.8 lbs) Blood pressure: 118/68  Heart rate: 98   Assessment/Plan: 1. Chronic Systolic HF - Echo (07/16/20): EF < 20% RV moderately down mild to mod MR/TR.  - Cath with normal coronaries, low filling pressures and reduced cardiac output. Cardiac MRI pending. - Suspect NICM possibly due to OTC stimulants (he does not use meth). There is some hyper trabeculation in the LV cavity but not felt to be consistent with LV noncompaction.  - SPEP negative. - cMRI (07/2020): Severe systolic dysfunction EF 13%, severe RV systolic dysfunction RVEF 21%, mild MR/TR. - NYHA II, weight up 4  lbs, believes weight gain is related to being out of his furosemide since Thursday.  - Out of all heart failure medications since Thursday except Entresto. Plan to restart all below medications. Refills sent for furosemide, potassium, spironolactone, Jardiance and digoxin to  [facility] (will use HF Fund). Will use HF Fund for [place] until patient assistance is approved through [facility]. Patient will pick them up today.   - Continue furosemide 40 mg daily. - Continue Entresto 24-26 mg BID.  - Continue spironolactone 25 mg daily. - Continue Jardiance 10 mg daily. - Continue digoxin 0.125 mg daily. - No beta-blocker yet w/ recent decompensation. - No indication for Life Vest at this time (discussed with Dr. Gala Romney previously).    2. Left leg erythema - Resolved. - Completed doxycycline. - [place] negative for DVT.   3. Tobacco use - Cut back to 4-5 cigs/day. - Encouraged cessation, says he plans to quit soon.  Return to HF Clinic in 3 weeks with APP. May be able to increase Entresto.    Karle Plumber, PharmD, BCPS, BCCP, CPP Heart Failure Clinic Pharmacist [phone]

## 2020-08-18 ENCOUNTER — Other Ambulatory Visit: Payer: Self-pay

## 2020-08-18 ENCOUNTER — Other Ambulatory Visit (HOSPITAL_COMMUNITY): Payer: Self-pay

## 2020-08-23 ENCOUNTER — Other Ambulatory Visit: Payer: Self-pay

## 2020-08-25 ENCOUNTER — Ambulatory Visit (HOSPITAL_COMMUNITY)
Admission: RE | Admit: 2020-08-25 | Discharge: 2020-08-25 | Disposition: A | Discharge status: Home / Self Care | Payer: Medicaid Other | Source: Ambulatory Visit | Attending: Cardiology | Admitting: Cardiology

## 2020-08-25 ENCOUNTER — Other Ambulatory Visit (HOSPITAL_COMMUNITY): Payer: Self-pay

## 2020-08-25 ENCOUNTER — Other Ambulatory Visit: Payer: Self-pay

## 2020-08-25 VITALS — BP 118/68 | HR 98 | Wt 170.2 lb

## 2020-08-25 DIAGNOSIS — F17210 Nicotine dependence, cigarettes, uncomplicated: Secondary | ICD-10-CM | POA: Insufficient documentation

## 2020-08-25 DIAGNOSIS — I5022 Chronic systolic (congestive) heart failure: Secondary | ICD-10-CM | POA: Insufficient documentation

## 2020-08-25 DIAGNOSIS — Z79899 Other long term (current) drug therapy: Secondary | ICD-10-CM | POA: Insufficient documentation

## 2020-08-25 DIAGNOSIS — L538 Other specified erythematous conditions: Secondary | ICD-10-CM | POA: Insufficient documentation

## 2020-08-25 DIAGNOSIS — Z7984 Long term (current) use of oral hypoglycemic drugs: Secondary | ICD-10-CM | POA: Insufficient documentation

## 2020-08-25 MED ORDER — DIGOXIN 125 MCG PO TABS
0.1250 mg | ORAL_TABLET | Freq: Every day | ORAL | 6 refills | Status: DC
  Filled 2020-08-25 – 2020-09-13 (×2): qty 30, 30d supply, fill #0
  Filled 2020-10-14: qty 30, 30d supply, fill #1
  Filled 2020-11-15: qty 30, 30d supply, fill #2
  Filled 2020-12-15: qty 30, 30d supply, fill #3
  Filled 2021-01-23: qty 30, 30d supply, fill #4

## 2020-08-25 MED ORDER — POTASSIUM CHLORIDE CRYS ER 20 MEQ PO TBCR
20.0000 meq | EXTENDED_RELEASE_TABLET | Freq: Every day | ORAL | 6 refills | Status: DC
  Filled 2020-08-25: qty 30, 30d supply, fill #0
  Filled 2020-09-13: qty 30, 30d supply, fill #1

## 2020-08-25 MED ORDER — SPIRONOLACTONE 25 MG PO TABS
25.0000 mg | ORAL_TABLET | Freq: Every day | ORAL | 6 refills | Status: DC
  Filled 2020-08-25 – 2020-09-13 (×2): qty 30, 30d supply, fill #0
  Filled 2020-10-14: qty 30, 30d supply, fill #1
  Filled 2020-11-15: qty 30, 30d supply, fill #2
  Filled 2020-12-15: qty 30, 30d supply, fill #3
  Filled 2021-01-23: qty 30, 30d supply, fill #4

## 2020-08-25 MED ORDER — EMPAGLIFLOZIN 10 MG PO TABS
10.0000 mg | ORAL_TABLET | Freq: Every day | ORAL | 1 refills | Status: DC
  Filled 2020-08-25 – 2020-08-30 (×2): qty 30, 30d supply, fill #0

## 2020-08-25 MED ORDER — FUROSEMIDE 40 MG PO TABS
40.0000 mg | ORAL_TABLET | Freq: Every day | ORAL | 6 refills | Status: DC
  Filled 2020-08-25 – 2020-09-13 (×2): qty 30, 30d supply, fill #0
  Filled 2020-10-14: qty 30, 30d supply, fill #1
  Filled 2020-11-15: qty 30, 30d supply, fill #2
  Filled 2020-12-15: qty 30, 30d supply, fill #3
  Filled 2021-01-23: qty 30, 30d supply, fill #4

## 2020-08-25 NOTE — Patient Instructions (Addendum)
It was a pleasure seeing you today!  MEDICATIONS: -No medication changes today -Call if you have questions about your medications.   NEXT APPOINTMENT: Return to clinic in 3 weeks with APP Clinic.  In general, to take care of your heart failure: -Limit your fluid intake to 2 Liters (half-gallon) per day.   -Limit your salt intake to ideally 2-3 grams (2000-3000 mg) per day. -Weigh yourself daily and record, and bring that "weight diary" to your next appointment.  (Weight gain of 2-3 pounds in 1 day typically means fluid weight.) -The medications for your heart are to help your heart and help you live longer.   -Please contact us before stopping any of your heart medications.  Call the clinic at [phone] with questions or to reschedule future appointments.  

## 2020-08-26 ENCOUNTER — Telehealth (HOSPITAL_COMMUNITY): Payer: Self-pay | Admitting: Pharmacy Technician

## 2020-08-26 NOTE — Telephone Encounter (Signed)
Advanced Heart Failure Patient Advocate Encounter  Sent in [employer] application via fax.  Will follow up.  

## 2020-08-28 ENCOUNTER — Encounter (HOSPITAL_COMMUNITY): Payer: Self-pay

## 2020-08-30 ENCOUNTER — Other Ambulatory Visit (HOSPITAL_COMMUNITY): Payer: Self-pay

## 2020-08-30 ENCOUNTER — Other Ambulatory Visit (HOSPITAL_COMMUNITY): Payer: Self-pay | Admitting: *Deleted

## 2020-08-30 NOTE — Telephone Encounter (Signed)
Advanced Heart Failure Patient Advocate Encounter   Patient was approved to receive Jardiance from [employer]  Effective dates: 08/30/20 through 08/30/21  Called and spoke with the patient.   Archer Asa, CPhT

## 2020-09-13 ENCOUNTER — Encounter: Payer: Self-pay | Admitting: Nurse Practitioner

## 2020-09-13 ENCOUNTER — Other Ambulatory Visit: Payer: Self-pay

## 2020-09-13 ENCOUNTER — Other Ambulatory Visit (HOSPITAL_COMMUNITY): Payer: Self-pay

## 2020-09-13 ENCOUNTER — Ambulatory Visit (INDEPENDENT_AMBULATORY_CARE_PROVIDER_SITE_OTHER): Payer: Self-pay | Admitting: Nurse Practitioner

## 2020-09-13 VITALS — BP 105/67 | HR 96 | Temp 98.2°F | Ht 70.0 in | Wt 170.1 lb

## 2020-09-13 DIAGNOSIS — I5021 Acute systolic (congestive) heart failure: Secondary | ICD-10-CM

## 2020-09-13 DIAGNOSIS — Z0000 Encounter for general adult medical examination without abnormal findings: Secondary | ICD-10-CM

## 2020-09-13 DIAGNOSIS — J4520 Mild intermittent asthma, uncomplicated: Secondary | ICD-10-CM

## 2020-09-13 DIAGNOSIS — Z131 Encounter for screening for diabetes mellitus: Secondary | ICD-10-CM

## 2020-09-13 LAB — POCT URINALYSIS DIP (CLINITEK)
Bilirubin, UA: NEGATIVE
Blood, UA: NEGATIVE
Glucose, UA: 250 mg/dL — AB
Ketones, POC UA: NEGATIVE mg/dL
Leukocytes, UA: NEGATIVE
Nitrite, UA: NEGATIVE
POC PROTEIN,UA: NEGATIVE
Spec Grav, UA: 1.01 (ref 1.010–1.025)
Urobilinogen, UA: 0.2 E.U./dL
pH, UA: 5 (ref 5.0–8.0)

## 2020-09-13 LAB — POCT GLYCOSYLATED HEMOGLOBIN (HGB A1C): Hemoglobin A1C: 5.3 % (ref 4.0–5.6)

## 2020-09-13 NOTE — Progress Notes (Signed)
Advanced Heart Failure Clinic Note    PCP: Kathrynn Speed, NP HF Cardiologist: Dr. Gala Romney  HPI: Caleb Landry is a 50 y.o. male with history of asthma, tobacco abuse, and a new diagnosis of systolic heart failure.  Admitted 7/22 for acute CHF exacerbation and started on lasix 40 mg IV bid. Echo obtained showing LVEF ~20% with global HK, G3DD, prominent LV. Cardiology and AHF team consulted to assist with management. Ultimately down 30 lbs after large volume diuresis. He underwent R/LHC showing normal cors, severe NICM EF 20-25%, low filling pressures with moderately reduced CO. Diuretic held for 2 days then resumed lasix 40 mg.  He was started on GDMT. CMRI and SPEP/UPEP ordered for amyloid work up. Suspect NICM possibly due to OTC stimulants (he does not use meth). Hospitalization complicated by lower extremity swelling w/ concern for cellulitis; treated with doxycycline. He was discharged on Entresto 24/26, dig, spiro 25, Jardiance 10, and lasix 40 daily. His discharge weight 165 was lbs.   Today he returns for HF follow up. Previous visit with HF Pharmacist he had been out of his meds for several days. Now back on all medications. He has quit smoking for the past month. Feeling good, gets SOB walking further distances on flat ground. Denies CP, dizziness, edema, or PND/Orthopnea. Appetite ok. No fever or chills. Weight at home 157-161 pounds.   CARDIAC STUDIES: - cMRI (7/22): LVEF 13%, RV severe systolic dysfunction 21%, mild MR/TR, RV insertion site LGE  - Echo (07/16/20): EF < 20, moderately dilated LV, mild LVH, enlarged RV with moderately reduced RV function, RVSP 54 mmHg, severe LAE, mild to moderate MR/TR  - R/LHC (07/19/20): normal cors, severe NICM EF 20-25%, low filling pressures w/ mod reduced CO   Ao = 96/70 (81) LV = 90/8 RA =  1 RV = 29/2 PA = 29/8 (15) PCW = 5 Fick cardiac output/index = 4.1/2.1 PVR = 2.5 SVR = 1572 Ao sat = 96% PA sat = 62%, 63%  Past Medical  History:  Diagnosis Date   Asthma    Neuropathy    Current Outpatient Medications  Medication Sig Dispense Refill   albuterol (VENTOLIN HFA) 108 (90 Base) MCG/ACT inhaler Inhale 2 puffs into the lungs every 6 (six) hours as needed for wheezing or shortness of breath. 8.5 g 2   digoxin (LANOXIN) 0.125 MG tablet Take 1 tablet (0.125 mg total) by mouth daily. 30 tablet 6   empagliflozin (JARDIANCE) 10 MG TABS tablet Take 1 tablet (10 mg total) by mouth daily. 30 tablet 1   furosemide (LASIX) 40 MG tablet Take 1 tablet (40 mg total) by mouth daily. 30 tablet 6   potassium chloride SA (KLOR-CON) 20 MEQ tablet Take 1 tablet (20 mEq total) by mouth daily. 30 tablet 6   sacubitril-valsartan (ENTRESTO) 24-26 MG Take 1 tablet by mouth 2 (two) times daily.     spironolactone (ALDACTONE) 25 MG tablet Take 1 tablet (25 mg total) by mouth daily. 30 tablet 6   No current facility-administered medications for this encounter.   Allergies  Allergen Reactions   Penicillins Hives   Social History   Socioeconomic History   Marital status: Widowed    Spouse name: Not on file   Number of children: Not on file   Years of education: Not on file   Highest education level: Not on file  Occupational History   Not on file  Tobacco Use   Smoking status: Former    Packs/day: 0.50  Years: 30.00    Pack years: 15.00    Types: Cigarettes    Quit date: 08/13/2020    Years since quitting: 0.0   Smokeless tobacco: Never  Substance and Sexual Activity   Alcohol use: Not Currently   Drug use: Not Currently   Sexual activity: Not on file  Other Topics Concern   Not on file  Social History Narrative   Not on file   Social Determinants of Health   Financial Resource Strain: High Risk   Difficulty of Paying Living Expenses: Hard  Food Insecurity: Food Insecurity Present   Worried About Running Out of Food in the Last Year: Sometimes true   Ran Out of Food in the Last Year: Sometimes true   Transportation Needs: Unmet Transportation Needs   Lack of Transportation (Medical): No   Lack of Transportation (Non-Medical): Yes  Physical Activity: Not on file  Stress: Not on file  Social Connections: Not on file  Intimate Partner Violence: Not on file   Family History  Problem Relation Age of Onset   Crohn's disease Mother    BP 112/80   Pulse 96   Wt 78.2 kg (172 lb 6.4 oz)   SpO2 95%   BMI 24.74 kg/m   Wt Readings from Last 3 Encounters:  09/15/20 78.2 kg (172 lb 6.4 oz)  09/13/20 77.1 kg (170 lb 0.8 oz)  08/25/20 77.2 kg (170 lb 3.2 oz)   PHYSICAL EXAM: General:  NAD. No resp difficulty HEENT: Normal Neck: Supple. No JVD. Carotids 2+ bilat; no bruits. No lymphadenopathy or thryomegaly appreciated. Cor: PMI nondisplaced. Regular rate & rhythm. No rubs, gallops or murmurs. Lungs: Clear Abdomen: Soft, nontender, nondistended. No hepatosplenomegaly. No bruits or masses. Good bowel sounds. Extremities: No cyanosis, clubbing, rash, edema; BLE varicosities. Neuro: Alert & oriented x 3, cranial nerves grossly intact. Moves all 4 extremities w/o difficulty. Affect pleasant.  ReDs: 33%  ASSESSMENT & PLAN: 1. Chronic Systolic HF - Echo (07/16/20): EF < 20% RV moderately down mild to mod MR/TR.  - Cath with normal coronaries, low filling pressures and reduced cardiac output. Cardiac MRI pending. - Suspect NICM possibly due to OTC stimulants (he does not use meth). There is some hyper trabeculation in the LV cavity but not felt to be consistent with LV noncompaction.  - SPEP negative. - cMRI (7/22): Severe systolic dysfunction EF 13%, severe RV systolic dysfunction RVEF 21%, mild MR/TR. - NYHA II, volume good today. - Start bisoprolol 2.5 mg daily (h/o asthma). - Continue lasix 40 mg daily. - Continue Entresto 24-26 mg bid.  - Continue digoxin 0.125 mg daily. - Continue spiro 25 mg daily. - Continue Jardiance 10 mg daily. - No indication for Life Vest at this time  (personally discussed with Dr. Gala Romney). - BMET and dig level today.  2. Tobacco use - Quit for past month. - Congratulated on cessation.  Follow up in 2 months with Dr. Gala Romney + echo.  Anderson [place], FNP 09/15/20

## 2020-09-13 NOTE — Patient Instructions (Signed)
You were seen today in the [facility] to establish care. Labs were collected, results will be available via MyChart or, if abnormal, you will be contacted by clinic staff. Please continue taking medications as prescribed.  Please follow up in 3 mths for reevaluation of chronic illness.

## 2020-09-13 NOTE — Progress Notes (Signed)
[facility] Patient [facility] [address] [place], [place]  [address] Phone:  [phone]   Fax:  [phone] Subjective:   Patient ID: Caleb Landry, male    DOB: September 10, 1970, 50 y.o.   MRN: [id number]  Chief Complaint  Patient presents with   Establish Care    Neuropathy.   Saralyn Pilar 50 y.o. male presents to establish care Congestive Heart Failure Presents for follow-up visit. Pertinent negatives include no abdominal pain, chest pain, fatigue, near-syncope, palpitations, paroxysmal nocturnal dyspnea, shortness of breath or unexpected weight change. The symptoms have been stable. Compliance with total regimen is 76-100%. Compliance with diet is 76-100%. Compliance with exercise is 76-100%. Compliance with medications is 76-100%.  Asthma There is no cough or shortness of breath. This is a chronic problem. The current episode started more than 1 year ago. The problem occurs intermittently. The problem has been unchanged. Pertinent negatives include no chest pain, dyspnea on exertion, fever, malaise/fatigue or weight loss. His symptoms are alleviated by beta-agonist. Risk factors: chronic smoker, quit one month ago. His past medical history is significant for asthma.   Patient denies any asthma attacks in the past year. Currently on disability due to CHF and neuropathy. States that he is compliant with all medication. Manages diet and walks daily.  Denies any chest pain, HA , dizziness or blurred vision. Denies any fever or shortness of breath. Denies any numbness or tingling.   Past Medical History:  Diagnosis Date   Asthma    Neuropathy     Past Surgical History:  Procedure Laterality Date   APPENDECTOMY     HERNIA REPAIR     RIGHT/LEFT HEART CATH AND CORONARY ANGIOGRAPHY N/A 07/19/2020   Procedure: RIGHT/LEFT HEART CATH AND CORONARY ANGIOGRAPHY;  Surgeon: Dolores Patty, MD;  Location: [facility] INVASIVE CV LAB;  Service: Cardiovascular;  Laterality: N/A;    Family  History  Problem Relation Age of Onset   Crohn's disease Mother     Social History   Socioeconomic History   Marital status: Widowed    Spouse name: Not on file   Number of children: Not on file   Years of education: Not on file   Highest education level: Not on file  Occupational History   Not on file  Tobacco Use   Smoking status: Former    Packs/day: 0.50    Years: 30.00    Pack years: 15.00    Types: Cigarettes    Quit date: 08/13/2020    Years since quitting: 0.0   Smokeless tobacco: Never  Substance and Sexual Activity   Alcohol use: Not Currently   Drug use: Not Currently   Sexual activity: Not on file  Other Topics Concern   Not on file  Social History Narrative   Not on file   Social Determinants of Health   Financial Resource Strain: High Risk   Difficulty of Paying Living Expenses: Hard  Food Insecurity: Food Insecurity Present   Worried About Running Out of Food in the Last Year: Sometimes true   Ran Out of Food in the Last Year: Sometimes true  Transportation Needs: Unmet Transportation Needs   Lack of Transportation (Medical): No   Lack of Transportation (Non-Medical): Yes  Physical Activity: Not on file  Stress: Not on file  Social Connections: Not on file  Intimate Partner Violence: Not on file    Outpatient Medications Prior to Visit  Medication Sig Dispense Refill   albuterol (VENTOLIN HFA) 108 (90 Base) MCG/ACT  inhaler Inhale 2 puffs into the lungs every 6 (six) hours as needed for wheezing or shortness of breath. 8.5 g 2   digoxin (LANOXIN) 0.125 MG tablet Take 1 tablet (0.125 mg total) by mouth daily. 30 tablet 6   empagliflozin (JARDIANCE) 10 MG TABS tablet Take 1 tablet (10 mg total) by mouth daily. 30 tablet 1   furosemide (LASIX) 40 MG tablet Take 1 tablet (40 mg total) by mouth daily. 30 tablet 6   potassium chloride SA (KLOR-CON) 20 MEQ tablet Take 1 tablet (20 mEq total) by mouth daily. 30 tablet 6   sacubitril-valsartan (ENTRESTO)  24-26 MG Take 1 tablet by mouth 2 (two) times daily.     spironolactone (ALDACTONE) 25 MG tablet Take 1 tablet (25 mg total) by mouth daily. 30 tablet 6   No facility-administered medications prior to visit.    Allergies  Allergen Reactions   Penicillins Hives    Review of Systems  Constitutional:  Negative for chills, fatigue, fever, malaise/fatigue, unexpected weight change and weight loss.  Eyes: Negative.   Respiratory:  Negative for cough and shortness of breath.   Cardiovascular:  Negative for chest pain, dyspnea on exertion, palpitations, leg swelling and near-syncope.  Gastrointestinal:  Negative for abdominal pain, blood in stool, constipation, diarrhea, nausea and vomiting.  Skin: Negative.   Neurological: Negative.   Psychiatric/Behavioral:  Negative for depression. The patient is not nervous/anxious.   All other systems reviewed and are negative.     Objective:    Physical Exam Vitals reviewed.  Constitutional:      General: He is not in acute distress.    Appearance: Normal appearance.  HENT:     Head: Normocephalic.  Cardiovascular:     Rate and Rhythm: Normal rate and regular rhythm.     Pulses: Normal pulses.     Heart sounds: Normal heart sounds.     Comments: No obvious peripheral edema Pulmonary:     Effort: Pulmonary effort is normal. No respiratory distress.     Breath sounds: Rales present.     Comments: Diffuse rales Chest:     Chest wall: No tenderness.  Skin:    General: Skin is warm and dry.     Capillary Refill: Capillary refill takes less than 2 seconds.  Neurological:     Mental Status: He is alert.  Psychiatric:        Mood and Affect: Mood normal.        Behavior: Behavior normal.        Thought Content: Thought content normal.        Judgment: Judgment normal.    BP 105/67 (BP Location: Right Arm, Patient Position: Sitting)   Pulse 96   Temp 98.2 F (36.8 C)   Ht 5\' 10"  (1.778 m)   Wt 170 lb 0.8 oz (77.1 kg)   SpO2 97%    BMI 24.40 kg/m  Wt Readings from Last 3 Encounters:  09/13/20 170 lb 0.8 oz (77.1 kg)  08/25/20 170 lb 3.2 oz (77.2 kg)  08/03/20 166 lb 12.8 oz (75.7 kg)     There is no immunization history on file for this patient.  Diabetic Foot Exam - Simple   No data filed     Lab Results  Component Value Date   TSH 1.630 07/17/2020   Lab Results  Component Value Date   WBC 6.0 07/20/2020   HGB 14.7 07/20/2020   HCT 45.5 07/20/2020   MCV 95.6 07/20/2020   PLT 376  07/20/2020   Lab Results  Component Value Date   NA 133 (L) 08/03/2020   K 4.6 08/03/2020   CO2 31 08/03/2020   GLUCOSE 96 08/03/2020   BUN 17 08/03/2020   CREATININE 1.07 08/03/2020   BILITOT 0.6 07/20/2020   ALKPHOS 78 07/20/2020   AST 27 07/20/2020   ALT 35 07/20/2020   PROT 6.3 (L) 07/20/2020   ALBUMIN 3.2 (L) 07/20/2020   CALCIUM 9.2 08/03/2020   ANIONGAP 6 08/03/2020   No results found for: CHOL No results found for: HDL No results found for: LDLCALC No results found for: TRIG No results found for: CHOLHDL Lab Results  Component Value Date   HGBA1C 5.3 09/13/2020       Assessment & Plan:   Problem List Items Addressed This Visit       Cardiovascular and Mediastinum   Acute congestive heart failure (HCC) Encouraged continued diet and exercise efforts  Encouraged continued compliance with medication       Respiratory   Asthma Continue using inhaler as needed   Other Visit Diagnoses     Healthcare maintenance    -  Primary   Relevant Orders   POCT URINALYSIS DIP (CLINITEK) (Completed)   CBC with Differential/Platelet   Comprehensive metabolic panel   Lipid panel   Screening for diabetes mellitus       Relevant Orders   HgB A1c (Completed)   Follow up in 3 mths for reevaluation of chronic illness    I am having Saralyn Pilar "[name]" maintain his albuterol, sacubitril-valsartan, digoxin, furosemide, potassium chloride SA, spironolactone, and empagliflozin.  No orders of the defined  types were placed in this encounter.    Kathrynn Speed, NP

## 2020-09-14 LAB — COMPREHENSIVE METABOLIC PANEL
ALT: 12 IU/L (ref 0–44)
AST: 12 IU/L (ref 0–40)
Albumin/Globulin Ratio: 1.8 (ref 1.2–2.2)
Albumin: 4.5 g/dL (ref 4.0–5.0)
Alkaline Phosphatase: 85 IU/L (ref 44–121)
BUN/Creatinine Ratio: 12 (ref 9–20)
BUN: 13 mg/dL (ref 6–24)
Bilirubin Total: <0.2 mg/dL (ref 0.0–1.2)
CO2: 27 mmol/L (ref 20–29)
Calcium: 9.8 mg/dL (ref 8.7–10.2)
Chloride: 99 mmol/L (ref 96–106)
Creatinine, Ser: 1.08 mg/dL (ref 0.76–1.27)
Globulin, Total: 2.5 g/dL (ref 1.5–4.5)
Glucose: 79 mg/dL (ref 65–99)
Potassium: 5.3 mmol/L — ABNORMAL HIGH (ref 3.5–5.2)
Sodium: 139 mmol/L (ref 134–144)
Total Protein: 7 g/dL (ref 6.0–8.5)
eGFR: 84 mL/min/{1.73_m2} (ref >59)

## 2020-09-14 LAB — LIPID PANEL
Chol/HDL Ratio: 3 ratio (ref 0.0–5.0)
Cholesterol, Total: 162 mg/dL (ref 100–199)
HDL: 54 mg/dL (ref >39)
LDL Chol Calc (NIH): 92 mg/dL (ref 0–99)
Triglycerides: 87 mg/dL (ref 0–149)
VLDL Cholesterol Cal: 16 mg/dL (ref 5–40)

## 2020-09-14 LAB — CBC WITH DIFFERENTIAL/PLATELET
Basophils Absolute: 0 10*3/uL (ref 0.0–0.2)
Basos: 1 %
EOS (ABSOLUTE): 0.1 10*3/uL (ref 0.0–0.4)
Eos: 2 %
Hematocrit: 44 % (ref 37.5–51.0)
Hemoglobin: 14.5 g/dL (ref 13.0–17.7)
Immature Grans (Abs): 0 10*3/uL (ref 0.0–0.1)
Immature Granulocytes: 0 %
Lymphocytes Absolute: 2.1 10*3/uL (ref 0.7–3.1)
Lymphs: 34 %
MCH: 30.6 pg (ref 26.6–33.0)
MCHC: 33 g/dL (ref 31.5–35.7)
MCV: 93 fL (ref 79–97)
Monocytes Absolute: 0.4 10*3/uL (ref 0.1–0.9)
Monocytes: 6 %
Neutrophils Absolute: 3.4 10*3/uL (ref 1.4–7.0)
Neutrophils: 57 %
Platelets: 279 10*3/uL (ref 150–450)
RBC: 4.74 x10E6/uL (ref 4.14–5.80)
RDW: 14 % (ref 11.6–15.4)
WBC: 6.1 10*3/uL (ref 3.4–10.8)

## 2020-09-15 ENCOUNTER — Ambulatory Visit (HOSPITAL_COMMUNITY)
Admission: RE | Admit: 2020-09-15 | Discharge: 2020-09-15 | Disposition: A | Discharge status: Home / Self Care | Payer: Self-pay | Source: Ambulatory Visit | Attending: Family Medicine | Admitting: Family Medicine

## 2020-09-15 ENCOUNTER — Other Ambulatory Visit: Payer: Self-pay

## 2020-09-15 ENCOUNTER — Encounter (HOSPITAL_COMMUNITY): Payer: Self-pay

## 2020-09-15 ENCOUNTER — Other Ambulatory Visit (HOSPITAL_COMMUNITY): Payer: Self-pay

## 2020-09-15 VITALS — BP 112/80 | HR 96 | Wt 172.4 lb

## 2020-09-15 DIAGNOSIS — Z596 Low income: Secondary | ICD-10-CM | POA: Insufficient documentation

## 2020-09-15 DIAGNOSIS — J45909 Unspecified asthma, uncomplicated: Secondary | ICD-10-CM | POA: Insufficient documentation

## 2020-09-15 DIAGNOSIS — I5022 Chronic systolic (congestive) heart failure: Secondary | ICD-10-CM | POA: Insufficient documentation

## 2020-09-15 DIAGNOSIS — Z87891 Personal history of nicotine dependence: Secondary | ICD-10-CM | POA: Insufficient documentation

## 2020-09-15 DIAGNOSIS — Z79899 Other long term (current) drug therapy: Secondary | ICD-10-CM | POA: Insufficient documentation

## 2020-09-15 DIAGNOSIS — Z720 Tobacco use: Secondary | ICD-10-CM

## 2020-09-15 DIAGNOSIS — Z880 Allergy status to penicillin: Secondary | ICD-10-CM | POA: Insufficient documentation

## 2020-09-15 DIAGNOSIS — Z5941 Food insecurity: Secondary | ICD-10-CM | POA: Insufficient documentation

## 2020-09-15 LAB — BASIC METABOLIC PANEL
Anion gap: 6 (ref 5–15)
BUN: 12 mg/dL (ref 6–20)
CO2: 30 mmol/L (ref 22–32)
Calcium: 9.6 mg/dL (ref 8.9–10.3)
Chloride: 99 mmol/L (ref 98–111)
Creatinine, Ser: 1.05 mg/dL (ref 0.61–1.24)
GFR, Estimated: >60 mL/min (ref >60)
Glucose, Bld: 94 mg/dL (ref 70–99)
Potassium: 5 mmol/L (ref 3.5–5.1)
Sodium: 135 mmol/L (ref 135–145)

## 2020-09-15 LAB — DIGOXIN LEVEL: Digoxin Level: 0.7 ng/mL — ABNORMAL LOW (ref 0.8–2.0)

## 2020-09-15 MED ORDER — BISOPROLOL FUMARATE 5 MG PO TABS
2.5000 mg | ORAL_TABLET | Freq: Every day | ORAL | 2 refills | Status: DC
  Filled 2020-09-15: qty 15, 30d supply, fill #0
  Filled 2020-10-14: qty 15, 30d supply, fill #1
  Filled 2020-11-15: qty 15, 30d supply, fill #2
  Filled 2020-12-15: qty 15, 30d supply, fill #3
  Filled 2021-01-23: qty 15, 30d supply, fill #4

## 2020-09-15 NOTE — Progress Notes (Signed)
ReDS Vest / Clip - 09/15/20 1100       ReDS Vest / Clip   Station Marker D    Ruler Value 31.5    ReDS Value Range Low volume    ReDS Actual Value 33    Anatomical Comments sitting

## 2020-09-15 NOTE — Patient Instructions (Addendum)
RedsClip done today.  Labs done today. We will contact you only if your labs are abnormal.  START Bisoprolol 2.5mg  (1/2 tablet) by mouth daily.   No other medication changes were made. Please continue all current medications as prescribed.  Your physician recommends that you schedule a follow-up appointment in: 2-3 months with Dr. Gala Romney with an echo prior to your exam.   Your physician has requested that you have an echocardiogram. Echocardiography is a painless test that uses sound waves to create images of your heart. It provides your doctor with information about the size and shape of your heart and how well your heart's chambers and valves are working. This procedure takes approximately one hour. There are no restrictions for this procedure.  If you have any questions or concerns before your next appointment please send [place] a message through [place] or call our office at [phone].    TO LEAVE A MESSAGE FOR THE NURSE SELECT OPTION 2, PLEASE LEAVE A MESSAGE INCLUDING: YOUR NAME DATE OF BIRTH CALL BACK NUMBER REASON FOR CALL**this is important as we prioritize the call backs  YOU WILL RECEIVE A CALL BACK THE SAME DAY AS LONG AS YOU CALL BEFORE 4:00 PM   Do the following things EVERYDAY: Weigh yourself in the morning before breakfast. Write it down and keep it in a log. Take your medicines as prescribed Eat low salt foods--Limit salt (sodium) to 2000 mg per day.  Stay as active as you can everyday Limit all fluids for the day to less than 2 liters   At the [facility], you and your health needs are our priority. As part of our continuing mission to provide you with exceptional heart care, we have created designated Provider Care Teams. These Care Teams include your primary Cardiologist (physician) and Advanced Practice Providers (APPs- Physician Assistants and Nurse Practitioners) who all work together to provide you with the care you need, when you need it.    You may see any of the following providers on your designated Care Team at your next follow up: Dr Arvilla Meres Dr Carron Curie, NP Robbie Lis, [place] Karle Plumber, PharmD   Please be sure to bring in all your medications bottles to every appointment.

## 2020-09-17 ENCOUNTER — Telehealth (HOSPITAL_COMMUNITY): Payer: Self-pay

## 2020-09-17 ENCOUNTER — Telehealth (HOSPITAL_COMMUNITY): Payer: Self-pay | Admitting: Surgery

## 2020-09-17 DIAGNOSIS — I5022 Chronic systolic (congestive) heart failure: Secondary | ICD-10-CM

## 2020-09-17 NOTE — Telephone Encounter (Signed)
-----   Message from Jacklynn Ganong, [place] sent at 09/16/2020  4:02 PM EDT ----- Labs stable. Please stop potassium supplement. Repeat BMET in 2 weeks.

## 2020-09-17 NOTE — Telephone Encounter (Signed)
BP cuff and pill splitter ordered and to be delivered to patients home per NP request.

## 2020-09-17 NOTE — Telephone Encounter (Signed)
Patient advised and verbalized understanding. Med list updated to reflect changes. Lab appt scheduled, lab order entered.   Orders Placed This Encounter  Procedures   Basic metabolic panel    Standing Status:   Future    Standing Expiration Date:   09/17/2021    Order Specific Question:   Release to patient    Answer:   Immediate

## 2020-09-27 ENCOUNTER — Ambulatory Visit: Payer: Medicaid Other | Admitting: Nurse Practitioner

## 2020-10-01 ENCOUNTER — Ambulatory Visit (HOSPITAL_COMMUNITY)
Admission: RE | Admit: 2020-10-01 | Discharge: 2020-10-01 | Disposition: A | Discharge status: Home / Self Care | Payer: Medicaid Other | Source: Ambulatory Visit | Attending: Cardiology | Admitting: Cardiology

## 2020-10-01 ENCOUNTER — Other Ambulatory Visit: Payer: Self-pay

## 2020-10-01 DIAGNOSIS — I5022 Chronic systolic (congestive) heart failure: Secondary | ICD-10-CM | POA: Insufficient documentation

## 2020-10-01 LAB — BASIC METABOLIC PANEL
Anion gap: 10 (ref 5–15)
BUN: 14 mg/dL (ref 6–20)
CO2: 27 mmol/L (ref 22–32)
Calcium: 9.7 mg/dL (ref 8.9–10.3)
Chloride: 101 mmol/L (ref 98–111)
Creatinine, Ser: 1.02 mg/dL (ref 0.61–1.24)
GFR, Estimated: >60 mL/min (ref >60)
Glucose, Bld: 85 mg/dL (ref 70–99)
Potassium: 4.4 mmol/L (ref 3.5–5.1)
Sodium: 138 mmol/L (ref 135–145)

## 2020-10-14 ENCOUNTER — Other Ambulatory Visit (HOSPITAL_COMMUNITY): Payer: Self-pay

## 2020-10-25 ENCOUNTER — Telehealth (HOSPITAL_COMMUNITY): Payer: Self-pay

## 2020-10-25 DIAGNOSIS — I5022 Chronic systolic (congestive) heart failure: Secondary | ICD-10-CM

## 2020-10-25 NOTE — Telephone Encounter (Signed)
Increase Lasix to 40 mg bid for the next 3 days and bring in for labs on Thursday 10/27 for BMP and BNP.   Robbie Lis, PA-C

## 2020-10-25 NOTE — Addendum Note (Signed)
Addended by: Rob Bunting R on: 10/25/2020 02:04 PM   Modules accepted: Orders

## 2020-10-25 NOTE — Telephone Encounter (Signed)
Patient advised and verbalized understanding,lab appointment scheduled,lab orders entered  Orders Placed This Encounter  Procedures   Basic metabolic panel    Standing Status:   Future    Standing Expiration Date:   10/25/2021    Order Specific Question:   Release to patient    Answer:   Immediate   B Nat Peptide    Standing Status:   Future    Standing Expiration Date:   10/25/2021    Order Specific Question:   Release to patient    Answer:   Immediate

## 2020-10-25 NOTE — Telephone Encounter (Signed)
Patient called reporting that his weight has been gradually increasing. He reports that his weight last week was 165lbs and now itd 177lbs and has been there for the last 2 days. He denies sob and any noticeable swelling. Please advise

## 2020-10-28 ENCOUNTER — Ambulatory Visit (HOSPITAL_COMMUNITY)
Admission: RE | Admit: 2020-10-28 | Discharge: 2020-10-28 | Disposition: A | Discharge status: Home / Self Care | Payer: Medicaid Other | Source: Ambulatory Visit | Attending: Cardiology | Admitting: Cardiology

## 2020-10-28 ENCOUNTER — Other Ambulatory Visit: Payer: Self-pay

## 2020-10-28 DIAGNOSIS — I5022 Chronic systolic (congestive) heart failure: Secondary | ICD-10-CM | POA: Insufficient documentation

## 2020-10-28 LAB — BRAIN NATRIURETIC PEPTIDE: B Natriuretic Peptide: 59.2 pg/mL (ref 0.0–100.0)

## 2020-10-28 LAB — BASIC METABOLIC PANEL
Anion gap: 11 (ref 5–15)
BUN: 17 mg/dL (ref 6–20)
CO2: 32 mmol/L (ref 22–32)
Calcium: 9.7 mg/dL (ref 8.9–10.3)
Chloride: 92 mmol/L — ABNORMAL LOW (ref 98–111)
Creatinine, Ser: 1.24 mg/dL (ref 0.61–1.24)
GFR, Estimated: >60 mL/min (ref >60)
Glucose, Bld: 165 mg/dL — ABNORMAL HIGH (ref 70–99)
Potassium: 3.8 mmol/L (ref 3.5–5.1)
Sodium: 135 mmol/L (ref 135–145)

## 2020-10-30 ENCOUNTER — Encounter (HOSPITAL_COMMUNITY): Payer: Self-pay

## 2020-11-15 ENCOUNTER — Other Ambulatory Visit (HOSPITAL_COMMUNITY): Payer: Self-pay

## 2020-12-06 ENCOUNTER — Encounter (HOSPITAL_COMMUNITY): Payer: Medicaid Other | Admitting: Internal Medicine

## 2020-12-06 ENCOUNTER — Ambulatory Visit (HOSPITAL_COMMUNITY): Payer: Medicaid Other

## 2020-12-13 ENCOUNTER — Ambulatory Visit: Payer: Medicaid Other | Admitting: Nurse Practitioner

## 2020-12-15 ENCOUNTER — Other Ambulatory Visit (HOSPITAL_COMMUNITY): Payer: Self-pay

## 2020-12-23 ENCOUNTER — Other Ambulatory Visit (HOSPITAL_COMMUNITY): Payer: Self-pay

## 2020-12-23 ENCOUNTER — Other Ambulatory Visit: Payer: Self-pay

## 2021-01-24 ENCOUNTER — Encounter: Payer: Self-pay | Admitting: Nurse Practitioner

## 2021-01-24 ENCOUNTER — Other Ambulatory Visit: Payer: Self-pay

## 2021-01-24 ENCOUNTER — Ambulatory Visit (INDEPENDENT_AMBULATORY_CARE_PROVIDER_SITE_OTHER): Payer: Self-pay | Admitting: Nurse Practitioner

## 2021-01-24 ENCOUNTER — Other Ambulatory Visit (HOSPITAL_COMMUNITY): Payer: Self-pay

## 2021-01-24 VITALS — BP 116/63 | HR 80 | Temp 98.2°F | Ht 70.0 in | Wt 183.8 lb

## 2021-01-24 DIAGNOSIS — J4520 Mild intermittent asthma, uncomplicated: Secondary | ICD-10-CM

## 2021-01-24 DIAGNOSIS — M2041 Other hammer toe(s) (acquired), right foot: Secondary | ICD-10-CM

## 2021-01-24 DIAGNOSIS — L03115 Cellulitis of right lower limb: Secondary | ICD-10-CM

## 2021-01-24 DIAGNOSIS — G629 Polyneuropathy, unspecified: Secondary | ICD-10-CM

## 2021-01-24 DIAGNOSIS — M2042 Other hammer toe(s) (acquired), left foot: Secondary | ICD-10-CM

## 2021-01-24 DIAGNOSIS — T7840XA Allergy, unspecified, initial encounter: Secondary | ICD-10-CM

## 2021-01-24 MED ORDER — PREDNISONE 20 MG PO TABS
20.0000 mg | ORAL_TABLET | Freq: Every day | ORAL | 0 refills | Status: AC
  Filled 2021-01-24 (×3): qty 5, 5d supply, fill #0

## 2021-01-24 MED ORDER — ALBUTEROL SULFATE HFA 108 (90 BASE) MCG/ACT IN AERS
2.0000 | INHALATION_SPRAY | Freq: Four times a day (QID) | RESPIRATORY_TRACT | 2 refills | Status: DC | PRN
  Filled 2021-01-24 – 2021-01-25 (×3): qty 8.5, 25d supply, fill #0

## 2021-01-24 MED ORDER — FAMOTIDINE 40 MG PO TABS
20.0000 mg | ORAL_TABLET | Freq: Every day | ORAL | 0 refills | Status: DC
  Filled 2021-01-24: qty 2.5, 5d supply, fill #0
  Filled 2021-01-24: qty 3, 5d supply, fill #0
  Filled 2021-01-24: qty 2.5, 5d supply, fill #0

## 2021-01-24 MED ORDER — CEPHALEXIN 500 MG PO CAPS
500.0000 mg | ORAL_CAPSULE | Freq: Four times a day (QID) | ORAL | 0 refills | Status: AC
  Filled 2021-01-24 (×2): qty 28, 7d supply, fill #0

## 2021-01-24 MED ORDER — GABAPENTIN 300 MG PO CAPS
300.0000 mg | ORAL_CAPSULE | Freq: Two times a day (BID) | ORAL | 5 refills | Status: DC
  Filled 2021-01-24 (×3): qty 60, 30d supply, fill #0

## 2021-01-24 MED ORDER — ALBUTEROL SULFATE (2.5 MG/3ML) 0.083% IN NEBU
2.5000 mg | INHALATION_SOLUTION | Freq: Four times a day (QID) | RESPIRATORY_TRACT | 1 refills | Status: DC | PRN
  Filled 2021-01-24 (×3): qty 150, 13d supply, fill #0

## 2021-01-24 NOTE — Progress Notes (Signed)
[facility] [address] [place], [place]  [address] Phone:  [phone]   Fax:  [phone] Subjective:   Patient ID: Caleb Landry, male    DOB: 12/31/1970, 51 y.o.   MRN: [id number]  Chief Complaint  Patient presents with   Follow-up    Pt is here today for his 3 month follow up visit. Pt states hat he has a bump on his forehead and he has tried to treat it with cream but it is not going away. Pt also states that he is having pains both feet and would like for that to be addressed as well.   HPI Caleb Landry 51 y.o. male  has a past medical history of Asthma and Neuropathy. To the [facility] for 3 mths follow up and evaluation of bilateral feet and rash  Patient states that he developed rash to RLE two days ago. Endorses some itching and redness, denies any pain or changes in size of affected area. Had applying lotion to the area with mild temporary relief.  Also concerned about worsening hammer toes to bilateral feet. States that he is having difficulty maintaining nails and they are growing into skin causing pain. Requesting referral to podiatry. Was evaluated by podiatry in the past and informed he needed repair of hammer toes. Also endorses worsening neuropathy pain in bilateral feet over the past few weeks  Verbalizes increased wheezing with intermittent shortness of breath over th past two days. Has ben using inhaler, but requires refill of albuterol. Denies any other respiratory symptoms or sick contacts.   States that he is having difficulty with medicare and is paying for certain medications out of pocket. Denies any other complaints today.  Denies any fever.Denies any fatigue, chest pain, shortness of breath, HA or dizziness. Denies any blurred vision, numbness or tingling.   Past Medical History:  Diagnosis Date   Asthma    Neuropathy     Past Surgical History:  Procedure Laterality Date   APPENDECTOMY     HERNIA REPAIR     RIGHT/LEFT HEART  CATH AND CORONARY ANGIOGRAPHY N/A 07/19/2020   Procedure: RIGHT/LEFT HEART CATH AND CORONARY ANGIOGRAPHY;  Surgeon: Dolores Patty, MD;  Location: [facility] INVASIVE [facility];  Service: Cardiovascular;  Laterality: N/A;    Family History  Problem Relation Age of Onset   Crohn's disease Mother     Social History   Socioeconomic History   Marital status: Widowed    Spouse name: Not on file   Number of children: Not on file   Years of education: Not on file   Highest education level: Not on file  Occupational History   Not on file  Tobacco Use   Smoking status: Former    Packs/day: 0.50    Years: 30.00    Pack years: 15.00    Types: Cigarettes    Quit date: 08/13/2020    Years since quitting: 0.4   Smokeless tobacco: Never  Vaping Use   Vaping Use: Never used  Substance and Sexual Activity   Alcohol use: Not Currently   Drug use: Not Currently   Sexual activity: Not Currently  Other Topics Concern   Not on file  Social History Narrative   Not on file   Social Determinants of Health   Financial Resource Strain: High Risk   Difficulty of Paying Living Expenses: Hard  Food Insecurity: Food Insecurity Present   Worried About Running Out of Food in the Last Year: Sometimes true  Ran Out of Food in the Last Year: Sometimes true  Transportation Needs: Unmet Transportation Needs   Lack of Transportation (Medical): No   Lack of Transportation (Non-Medical): Yes  Physical Activity: Not on file  Stress: Not on file  Social Connections: Not on file  Intimate Partner Violence: Not on file    Outpatient Medications Prior to Visit  Medication Sig Dispense Refill   bisoprolol (ZEBETA) 5 MG tablet Take 0.5 tablets (2.5 mg total) by mouth daily. 34 tablet 2   digoxin (LANOXIN) 0.125 MG tablet Take 1 tablet (0.125 mg total) by mouth daily. 30 tablet 6   empagliflozin (JARDIANCE) 10 MG TABS tablet Take 1 tablet (10 mg total) by mouth daily. 30 tablet 1   furosemide (LASIX) 40 MG  tablet Take 1 tablet (40 mg total) by mouth daily. 30 tablet 6   sacubitril-valsartan (ENTRESTO) 24-26 MG Take 1 tablet by mouth 2 (two) times daily.     spironolactone (ALDACTONE) 25 MG tablet Take 1 tablet (25 mg total) by mouth daily. 30 tablet 6   albuterol (VENTOLIN HFA) 108 (90 Base) MCG/ACT inhaler Inhale 2 puffs into the lungs every 6 (six) hours as needed for wheezing or shortness of breath. 8.5 g 2   No facility-administered medications prior to visit.    Allergies  Allergen Reactions   Penicillins Hives    Review of Systems  Constitutional:  Negative for chills, fever and malaise/fatigue.  HENT: Negative.    Eyes: Negative.   Respiratory:  Positive for shortness of breath and wheezing. Negative for cough.   Cardiovascular:  Negative for chest pain, palpitations and leg swelling.  Gastrointestinal:  Negative for abdominal pain, blood in stool, constipation, diarrhea, nausea and vomiting.  Musculoskeletal:        See HPI  Skin:  Positive for rash.  Neurological:  Positive for tingling and sensory change. Negative for dizziness, tremors, speech change, focal weakness, seizures, loss of consciousness, weakness and headaches.  Psychiatric/Behavioral:  Negative for depression. The patient is not nervous/anxious.   All other systems reviewed and are negative.     Objective:    Physical Exam Constitutional:      General: He is not in acute distress.    Appearance: Normal appearance. He is normal weight.  HENT:     Head: Normocephalic.  Cardiovascular:     Rate and Rhythm: Normal rate and regular rhythm.     Pulses: Normal pulses.     Heart sounds: Normal heart sounds.     Comments: No obvious peripheral edema Pulmonary:     Effort: Pulmonary effort is normal. No respiratory distress.     Breath sounds: Normal breath sounds. No stridor. No wheezing, rhonchi or rales.  Chest:     Chest wall: No tenderness.  Musculoskeletal:        General: Deformity present. No  swelling, tenderness or signs of injury. Normal range of motion.     Right lower leg: No edema.     Left lower leg: No edema.     Comments: Significant deformity of bilateral toes noted, consistent with hammer toes   Skin:    General: Skin is warm and dry.     Capillary Refill: Capillary refill takes less than 2 seconds.     Findings: Erythema and rash present. Rash is papular.       Neurological:     General: No focal deficit present.     Mental Status: He is alert and oriented to person, place, and  time.  Psychiatric:        Mood and Affect: Mood normal.        Behavior: Behavior normal.        Thought Content: Thought content normal.        Judgment: Judgment normal.    BP 116/63    Pulse 80    Temp 98.2 F (36.8 C)    Ht $R'5\' 10"'lR$  (1.778 m)    Wt 183 lb 12.8 oz (83.4 kg)    SpO2 98%    BMI 26.37 kg/m  Wt Readings from Last 3 Encounters:  01/24/21 183 lb 12.8 oz (83.4 kg)  09/15/20 172 lb 6.4 oz (78.2 kg)  09/13/20 170 lb 0.8 oz (77.1 kg)     There is no immunization history on file for this patient.  Diabetic Foot Exam - Simple   No data filed     Lab Results  Component Value Date   TSH 1.630 07/17/2020   Lab Results  Component Value Date   WBC 6.1 09/13/2020   HGB 14.5 09/13/2020   HCT 44.0 09/13/2020   MCV 93 09/13/2020   PLT 279 09/13/2020   Lab Results  Component Value Date   NA 135 10/28/2020   K 3.8 10/28/2020   CO2 32 10/28/2020   GLUCOSE 165 (H) 10/28/2020   BUN 17 10/28/2020   CREATININE 1.24 10/28/2020   BILITOT <0.2 09/13/2020   ALKPHOS 85 09/13/2020   AST 12 09/13/2020   ALT 12 09/13/2020   PROT 7.0 09/13/2020   ALBUMIN 4.5 09/13/2020   CALCIUM 9.7 10/28/2020   ANIONGAP 11 10/28/2020   EGFR 84 09/13/2020   Lab Results  Component Value Date   CHOL 162 09/13/2020   Lab Results  Component Value Date   HDL 54 09/13/2020   Lab Results  Component Value Date   LDLCALC 92 09/13/2020   Lab Results  Component Value Date   TRIG 87  09/13/2020   Lab Results  Component Value Date   CHOLHDL 3.0 09/13/2020   Lab Results  Component Value Date   HGBA1C 5.3 09/13/2020       Assessment & Plan:   Problem List Items Addressed This Visit       Respiratory   Asthma - Primary   Relevant Medications   albuterol (VENTOLIN HFA) 108 (90 Base) MCG/ACT inhaler   albuterol (PROVENTIL) (2.5 MG/3ML) 0.083% nebulizer solution            Informed to continue to using albuterol as needed for symptoms   Other Visit Diagnoses     Cellulitis of right lower extremity       Relevant Medications   cephALEXin (KEFLEX) 500 MG capsule Discussed non pharmacological methods for management of symptoms, discouraged scratching effected areas   Allergic reaction, initial encounter       Relevant Medications   predniSONE (DELTASONE) 20 MG tablet   famotidine (PEPCID) 40 MG tablet   Hammer toes of both feet       Relevant Orders   Ambulatory referral to Podiatry Discussed foot care   Neuropathy       Relevant Medications   gabapentin (NEURONTIN) 300 MG capsule, added to treatment plan for additional management of neuropathy   Follow up in 6 mths for reevaluation of chronic illness, sooner as needed     I am having Caleb Dolly "[name]" start on albuterol, predniSONE, cephALEXin, gabapentin, and famotidine. I am also having him maintain his sacubitril-valsartan, digoxin, furosemide, spironolactone, empagliflozin, bisoprolol,  and albuterol.  Meds ordered this encounter  Medications   albuterol (VENTOLIN HFA) 108 (90 Base) MCG/ACT inhaler    Sig: Inhale 2 puffs into the lungs every 6 (six) hours as needed for wheezing or shortness of breath.    Dispense:  8.5 g    Refill:  2   albuterol (PROVENTIL) (2.5 MG/3ML) 0.083% nebulizer solution    Sig: Take 3 mLs (2.5 mg total) by nebulization every 6 (six) hours as needed for wheezing or shortness of breath.    Dispense:  150 mL    Refill:  1   predniSONE (DELTASONE) 20 MG tablet    Sig:  Take 1 tablet (20 mg total) by mouth daily with breakfast for 5 days.    Dispense:  5 tablet    Refill:  0   cephALEXin (KEFLEX) 500 MG capsule    Sig: Take 1 capsule (500 mg total) by mouth 4 (four) times daily for 7 days.    Dispense:  28 capsule    Refill:  0   gabapentin (NEURONTIN) 300 MG capsule    Sig: Take 1 capsule (300 mg total) by mouth 2 (two) times daily.    Dispense:  60 capsule    Refill:  5   famotidine (PEPCID) 40 MG tablet    Sig: Take 0.5 tablets (20 mg total) by mouth at bedtime for 5 days.    Dispense:  2.5 tablet    Refill:  0     Teena Dunk, NP

## 2021-01-24 NOTE — Patient Instructions (Signed)
You were seen today in the [facility] for three month follow up.  You were prescribed medications, please take as directed. Please follow up in 6 mths for reevaluation of chronic illness.

## 2021-01-25 ENCOUNTER — Other Ambulatory Visit: Payer: Self-pay

## 2021-01-27 ENCOUNTER — Other Ambulatory Visit: Payer: Self-pay

## 2021-01-31 ENCOUNTER — Other Ambulatory Visit: Payer: Self-pay

## 2021-01-31 ENCOUNTER — Encounter (HOSPITAL_COMMUNITY): Payer: Self-pay

## 2021-01-31 ENCOUNTER — Ambulatory Visit (HOSPITAL_BASED_OUTPATIENT_CLINIC_OR_DEPARTMENT_OTHER)
Admission: RE | Admit: 2021-01-31 | Discharge: 2021-01-31 | Disposition: A | Discharge status: Home / Self Care | Payer: Self-pay | Source: Ambulatory Visit | Attending: Family Medicine | Admitting: Family Medicine

## 2021-01-31 ENCOUNTER — Ambulatory Visit (HOSPITAL_COMMUNITY)
Admission: RE | Admit: 2021-01-31 | Discharge: 2021-01-31 | Disposition: A | Discharge status: Home / Self Care | Payer: Self-pay | Source: Ambulatory Visit | Attending: Family Medicine | Admitting: Family Medicine

## 2021-01-31 ENCOUNTER — Other Ambulatory Visit (HOSPITAL_COMMUNITY): Payer: Self-pay

## 2021-01-31 ENCOUNTER — Telehealth (HOSPITAL_COMMUNITY): Payer: Self-pay

## 2021-01-31 VITALS — BP 118/76 | HR 78 | Wt 184.6 lb

## 2021-01-31 DIAGNOSIS — I5032 Chronic diastolic (congestive) heart failure: Secondary | ICD-10-CM

## 2021-01-31 DIAGNOSIS — I5022 Chronic systolic (congestive) heart failure: Secondary | ICD-10-CM

## 2021-01-31 DIAGNOSIS — L539 Erythematous condition, unspecified: Secondary | ICD-10-CM

## 2021-01-31 DIAGNOSIS — R21 Rash and other nonspecific skin eruption: Secondary | ICD-10-CM | POA: Insufficient documentation

## 2021-01-31 DIAGNOSIS — Z79899 Other long term (current) drug therapy: Secondary | ICD-10-CM | POA: Insufficient documentation

## 2021-01-31 DIAGNOSIS — F17210 Nicotine dependence, cigarettes, uncomplicated: Secondary | ICD-10-CM | POA: Insufficient documentation

## 2021-01-31 DIAGNOSIS — J45909 Unspecified asthma, uncomplicated: Secondary | ICD-10-CM | POA: Insufficient documentation

## 2021-01-31 DIAGNOSIS — Z720 Tobacco use: Secondary | ICD-10-CM

## 2021-01-31 LAB — ECHOCARDIOGRAM COMPLETE
AR max vel: 2.47 cm2
AV Area VTI: 2.67 cm2
AV Area mean vel: 2.69 cm2
AV Mean grad: 4 mmHg
AV Peak grad: 7.1 mmHg
Ao pk vel: 1.33 m/s
Area-P 1/2: 3.23 cm2
Calc EF: 41.9 %
S' Lateral: 4.6 cm
Single Plane A2C EF: 35.9 %
Single Plane A4C EF: 44.5 %

## 2021-01-31 LAB — BASIC METABOLIC PANEL
Anion gap: 10 (ref 5–15)
BUN: 12 mg/dL (ref 6–20)
CO2: 27 mmol/L (ref 22–32)
Calcium: 8.9 mg/dL (ref 8.9–10.3)
Chloride: 99 mmol/L (ref 98–111)
Creatinine, Ser: 0.91 mg/dL (ref 0.61–1.24)
GFR, Estimated: >60 mL/min (ref >60)
Glucose, Bld: 105 mg/dL — ABNORMAL HIGH (ref 70–99)
Potassium: 3.9 mmol/L (ref 3.5–5.1)
Sodium: 136 mmol/L (ref 135–145)

## 2021-01-31 LAB — DIGOXIN LEVEL: Digoxin Level: 0.4 ng/mL — ABNORMAL LOW (ref 0.8–2.0)

## 2021-01-31 MED ORDER — FUROSEMIDE 40 MG PO TABS: 40.0000 mg | ORAL_TABLET | Freq: Every day | ORAL | 6 refills | Status: DC | PRN

## 2021-01-31 MED ORDER — ENTRESTO 49-51 MG PO TABS: 1.0000 | ORAL_TABLET | Freq: Two times a day (BID) | ORAL | 3 refills | Status: DC

## 2021-01-31 MED ORDER — POTASSIUM CHLORIDE CRYS ER 20 MEQ PO TBCR
20.0000 meq | EXTENDED_RELEASE_TABLET | Freq: Every day | ORAL | 2 refills | Status: DC | PRN
  Filled 2021-01-31: qty 30, 30d supply, fill #0

## 2021-01-31 NOTE — Progress Notes (Signed)
[facility] Note    PCP: Teena Dunk, NP HF Cardiologist: Dr. Haroldine Laws  HPI: Caleb Landry is a 51 y.o. male with history of asthma, tobacco abuse, and a new diagnosis of systolic heart failure.  Admitted 7/22 for acute CHF exacerbation and started on lasix 40 mg IV bid. Echo obtained showing LVEF ~20% with global HK, G3DD, prominent LV. Cardiology and AHF team consulted to assist with management. Ultimately down 30 lbs after large volume diuresis. He underwent R/LHC showing normal cors, severe NICM EF 20-25%, low filling pressures with moderately reduced CO. Diuretic held for 2 days then resumed lasix 40 mg.  He was started on GDMT. CMRI and SPEP/UPEP ordered for amyloid work up. Suspect NICM possibly due to OTC stimulants (he does not use meth). Hospitalization complicated by lower extremity swelling w/ concern for cellulitis; treated with doxycycline. He was discharged on Entresto 24/26, dig, spiro 25, Jardiance 10, and lasix 40 daily. His discharge weight 165 was lbs.   Follow up 9/22 doing well, NYHA II symptoms, bisoprolol added. Weight 157-161 lbs at home. Quit smoking cigs.  Today he returns for HF follow up. Overall feeling fine. Finishing a steroid taper for asthma and feels mood is "heavy." He gets SOB with stairs, occasional SOB walking on flat ground. He tries to walk outside for exercise 3 days a week for 30  minutes. Denies palpitations, abnormal bleeding, CP, dizziness, edema, or PND/Orthopnea. Appetite ok. No fever or chills. Weight at home 172-185 pounds. BP log shows 120-140s/60-70s. Taking all medications. He is disabled and not working. In process applying for Social Security. Back to smoking 1/2 ppd. Finished 1 week of abx for RLE cellulitis, but symptoms no better.  Echo today 01/31/21, results pending.    CARDIAC STUDIES: - cMRI (7/22): LVEF 13%, RV severe systolic dysfunction [id number], mild MR/TR, RV insertion site LGE  - Echo (07/16/20): EF < 20,  moderately dilated LV, mild LVH, enlarged RV with moderately reduced RV function, RVSP 54 mmHg, severe LAE, mild to moderate MR/TR  - R/LHC (07/19/20): normal cors, severe NICM EF 20-25%, low filling pressures w/ mod reduced CO   Ao = 96/70 (81) LV = 90/8 RA =  1 RV = 29/2 PA = 29/8 (15) PCW = 5 Fick cardiac output/index = 4.1/2.1 PVR = 2.5 SVR = 1572 Ao sat = 96% PA sat = 62%, 63%  Past Medical History:  Diagnosis Date   Asthma    Neuropathy    Current Outpatient Medications  Medication Sig Dispense Refill   albuterol (PROVENTIL) (2.5 MG/3ML) 0.083% nebulizer solution Take 3 mLs (2.5 mg total) by nebulization every 6 (six) hours as needed for wheezing or shortness of breath. 150 mL 1   albuterol (VENTOLIN HFA) 108 (90 Base) MCG/ACT inhaler Inhale 2 puffs into the lungs every 6 (six) hours as needed for wheezing or shortness of breath. 8.5 g 2   bisoprolol (ZEBETA) 5 MG tablet Take 0.5 tablets (2.5 mg total) by mouth daily. 34 tablet 2   cephALEXin (KEFLEX) 500 MG capsule Take 1 capsule (500 mg total) by mouth 4 (four) times daily for 7 days. 28 capsule 0   digoxin (LANOXIN) 0.125 MG tablet Take 1 tablet (0.125 mg total) by mouth daily. 30 tablet 6   empagliflozin (JARDIANCE) 10 MG TABS tablet Take 1 tablet (10 mg total) by mouth daily. 30 tablet 1   famotidine (PEPCID) 40 MG tablet Take 0.5 tablets (20 mg total) by mouth at bedtime for 5  days. 3 tablet 0   furosemide (LASIX) 40 MG tablet Take 1 tablet (40 mg total) by mouth daily. 30 tablet 6   gabapentin (NEURONTIN) 300 MG capsule Take 1 capsule (300 mg total) by mouth 2 (two) times daily. 60 capsule 5   sacubitril-valsartan (ENTRESTO) 24-26 MG Take 1 tablet by mouth 2 (two) times daily.     spironolactone (ALDACTONE) 25 MG tablet Take 1 tablet (25 mg total) by mouth daily. 30 tablet 6   No current facility-administered medications for this encounter.   Allergies  Allergen Reactions   Penicillins Hives   Social History    Socioeconomic History   Marital status: Widowed    Spouse name: Not on file   Number of children: Not on file   Years of education: Not on file   Highest education level: Not on file  Occupational History   Not on file  Tobacco Use   Smoking status: Former    Packs/day: 0.50    Years: 30.00    Pack years: 15.00    Types: Cigarettes    Quit date: 08/13/2020    Years since quitting: 0.4   Smokeless tobacco: Never  Vaping Use   Vaping Use: Never used  Substance and Sexual Activity   Alcohol use: Not Currently   Drug use: Not Currently   Sexual activity: Not Currently  Other Topics Concern   Not on file  Social History Narrative   Not on file   Social Determinants of Health   Financial Resource Strain: High Risk   Difficulty of Paying Living Expenses: Hard  Food Insecurity: Food Insecurity Present   Worried About Running Out of Food in the Last Year: Sometimes true   Ran Out of Food in the Last Year: Sometimes true  Transportation Needs: Unmet Transportation Needs   Lack of Transportation (Medical): No   Lack of Transportation (Non-Medical): Yes  Physical Activity: Not on file  Stress: Not on file  Social Connections: Not on file  Intimate Partner Violence: Not on file   Family History  Problem Relation Age of Onset   Crohn's disease Mother    BP 118/76    Pulse 78    Wt 83.7 kg (184 lb 9.6 oz)    SpO2 96%    BMI 26.49 kg/m   Wt Readings from Last 3 Encounters:  01/31/21 83.7 kg (184 lb 9.6 oz)  01/24/21 83.4 kg (183 lb 12.8 oz)  09/15/20 78.2 kg (172 lb 6.4 oz)   PHYSICAL EXAM: General:  NAD. No resp difficulty, mildly anxious-appearing. HEENT: Normal Neck: Supple. No JVD. Carotids 2+ bilat; no bruits. No lymphadenopathy or thryomegaly appreciated. Cor: PMI nondisplaced. Regular rate & rhythm. No rubs, gallops or murmurs. Lungs: Clear Abdomen: Soft, nontender, nondistended. No hepatosplenomegaly. No bruits or masses. Good bowel sounds. Extremities: No  cyanosis, clubbing, rash, edema; BLE varicosities. Red, scaly papular patches to lateral RLE and bilateral knees. Neuro: Alert & oriented x 3, cranial nerves grossly intact. Moves all 4 extremities w/o difficulty. Affect pleasant.  ECG: NSR + LVH (personally reviewed).  ReDs: 30%  ASSESSMENT & PLAN: 1. Chronic Systolic HF - Echo (A999333): EF < 20% RV moderately down mild to mod MR/TR.  - Cath with normal coronaries, low filling pressures and reduced cardiac output. Cardiac MRI pending. - Suspect NICM possibly due to OTC stimulants (he does not use meth). There is some hyper trabeculation in the LV cavity but not felt to be consistent with LV noncompaction.  - SPEP negative. -  cMRI (7/22): Severe systolic dysfunction EF [id number], severe RV systolic dysfunction RVEF 21%, mild MR/TR. - Echo today (01/31/21). - NYHA II, volume good today, ReDs 30%. - Increase Entresto to 49/51 mg bid. Entresto thru [employer]. - Change lasix to 40 mg daily + 20 KCL daily PRN weight/gain edema. - Continue bisoprolol 2.5 mg daily (h/o asthma). - Continue digoxin 0.125 mg daily. If EF improved on today's echo, will stop digoxin. - Continue spiro 25 mg daily. - Continue Jardiance 10 mg daily. Jardiance through [employer]. - No indication for Life Vest at this time (personally discussed with Dr. Haroldine Laws). - Continue to weigh daily and log. - BMET and dig level today; BMET in 10-14 days.  2. Tobacco use - Previously quit but now back to 1/2 ppd. - Encouraged cessation. He is trying to cut down.  3. Rash - Finished Keflex for presumed cellulitis, but no improvement. Also on pred. - Looks like psoriasis vs eczema to me.  - Advised follow up with PCP, may need derm referral.  Follow up in 2-3 months with Dr. Haroldine Laws.  [place], FNP 01/31/21

## 2021-01-31 NOTE — Progress Notes (Signed)
ReDS Vest / Clip - 01/31/21 0900       ReDS Vest / Clip   Station Marker D    Ruler Value 28    ReDS Value Range Low volume    ReDS Actual Value 30

## 2021-01-31 NOTE — Patient Instructions (Signed)
Medication Changes:  Increase Entresto to 49/51 Twice daily  Change Lasix to As needed for weight gain, edema or Shortness of breath  Take a potassium tablet every time you take a lasix tablet  Lab Work:  Labs done today, your results will be available in MyChart, we will contact you for abnormal readings.   Testing/Procedures:  none  Referrals:  none  Special Instructions // Education:  none  Follow-Up in: 3 months with Dr. Gala Romney  At the [facility], you and your health needs are our priority. We have a designated team specialized in the treatment of Heart Failure. This Care Team includes your primary Heart Failure Specialized Cardiologist (physician), Advanced Practice Providers (APPs- Physician Assistants and Nurse Practitioners), and Pharmacist who all work together to provide you with the care you need, when you need it.   You may see any of the following providers on your designated Care Team at your next follow up:  Dr Arvilla Meres Dr Carron Curie, NP Robbie Lis, [place] [facility] [place], [place] Karle Plumber, PharmD   Please be sure to bring in all your medications bottles to every appointment.   Need to Contact us:  If you have any questions or concerns before your next appointment please send [place] a message through [place] or call our office at [phone].    TO LEAVE A MESSAGE FOR THE NURSE SELECT OPTION 2, PLEASE LEAVE A MESSAGE INCLUDING: YOUR NAME DATE OF BIRTH CALL BACK NUMBER REASON FOR CALL**this is important as we prioritize the call backs  YOU WILL RECEIVE A CALL BACK THE SAME DAY AS LONG AS YOU CALL BEFORE 4:00 PM

## 2021-01-31 NOTE — Telephone Encounter (Addendum)
Pt aware, agreeable, and verbalized understanding   ----- Message from Jacklynn Ganong, FNP sent at 01/31/2021 12:04 PM EST ----- EF improving, now up to 40-45%! Valves look ok.   OK to stop digoxin. Continue other medications.

## 2021-01-31 NOTE — Progress Notes (Signed)
°  Echocardiogram 2D Echocardiogram has been performed.  Gerda Diss 01/31/2021, 8:52 AM

## 2021-02-02 ENCOUNTER — Other Ambulatory Visit (HOSPITAL_COMMUNITY): Payer: Self-pay

## 2021-02-03 ENCOUNTER — Other Ambulatory Visit (HOSPITAL_COMMUNITY): Payer: Self-pay

## 2021-02-03 ENCOUNTER — Other Ambulatory Visit: Payer: Self-pay

## 2021-02-03 ENCOUNTER — Ambulatory Visit (INDEPENDENT_AMBULATORY_CARE_PROVIDER_SITE_OTHER): Payer: 59 | Admitting: Podiatry

## 2021-02-03 ENCOUNTER — Ambulatory Visit (INDEPENDENT_AMBULATORY_CARE_PROVIDER_SITE_OTHER): Payer: 59

## 2021-02-03 DIAGNOSIS — M79675 Pain in left toe(s): Secondary | ICD-10-CM

## 2021-02-03 DIAGNOSIS — M79671 Pain in right foot: Secondary | ICD-10-CM

## 2021-02-03 DIAGNOSIS — M79672 Pain in left foot: Secondary | ICD-10-CM

## 2021-02-03 DIAGNOSIS — M2041 Other hammer toe(s) (acquired), right foot: Secondary | ICD-10-CM | POA: Diagnosis not present

## 2021-02-03 DIAGNOSIS — M79674 Pain in right toe(s): Secondary | ICD-10-CM | POA: Diagnosis not present

## 2021-02-03 DIAGNOSIS — M2042 Other hammer toe(s) (acquired), left foot: Secondary | ICD-10-CM

## 2021-02-03 DIAGNOSIS — B351 Tinea unguium: Secondary | ICD-10-CM | POA: Diagnosis not present

## 2021-02-03 DIAGNOSIS — G629 Polyneuropathy, unspecified: Secondary | ICD-10-CM

## 2021-02-03 DIAGNOSIS — R21 Rash and other nonspecific skin eruption: Secondary | ICD-10-CM

## 2021-02-03 MED ORDER — NYSTATIN-TRIAMCINOLONE 100000-0.1 UNIT/GM-% EX OINT
1.0000 | TOPICAL_OINTMENT | Freq: Two times a day (BID) | CUTANEOUS | 0 refills | Status: DC
Start: 2021-02-03 — End: 2021-08-10

## 2021-02-03 MED ORDER — TRIAMCINOLONE ACETONIDE 0.025 % EX OINT: 1.0000 "application " | TOPICAL_OINTMENT | Freq: Two times a day (BID) | CUTANEOUS | 0 refills | Status: DC

## 2021-02-03 MED ORDER — NYSTATIN-TRIAMCINOLONE 100000-0.1 UNIT/GM-% EX OINT
1.0000 "application " | TOPICAL_OINTMENT | Freq: Two times a day (BID) | CUTANEOUS | 0 refills | Status: DC
  Filled 2021-02-03: qty 30, 15d supply, fill #0

## 2021-02-03 MED ORDER — TRIAMCINOLONE ACETONIDE 0.025 % EX OINT
1.0000 "application " | TOPICAL_OINTMENT | Freq: Two times a day (BID) | CUTANEOUS | 0 refills | Status: DC
  Filled 2021-02-03: qty 30, 15d supply, fill #0

## 2021-02-03 NOTE — Progress Notes (Signed)
DG  °

## 2021-02-03 NOTE — Patient Instructions (Signed)
We are going to work on getting medical clearance for the hammertoe surgery  Hammer Toe Hammer toe is a change in the shape, or a deformity, of the toe. The deformity causes the middle joint of the toe to stay bent. Hammer toe starts gradually. At first, the toe can be straightened. Then over time, the toe deformity becomes stiff, inflexible, and permanently bent. Hammer toe usually affects the second, third, or fourth toe. A hammer toe causes pain, especially when wearing shoes. Corns and calluses can result from the toe rubbing against the inside of the shoe. Early treatments to keep the toe straight may relieve pain. As the deformity of the toe becomes stiff and permanent, surgery may be needed to straighten the toe. What are the causes? This condition is caused by abnormal bending of the toe joint that is closest to your foot. Over time, the toe bending downward pulls on the muscles and connections (tendons) of the toe joint, making them weak and stiff. Wearing shoes that are too narrow in the toe box and do not allow toes to fully straighten can cause this condition. What increases the risk? You are more likely to develop this condition if you: Are an older male. Wear shoes that are too small, or wear high-heeled shoes that pinch your toes. Have a second toe that is longer than your big toe (first toe). Injure your foot or toe. Have arthritis, or have a nerve or muscle disorder. Have diabetes or a condition known as Charcot joint, which may cause you to walk abnormally. Have a family history of hammer toe. Are a Advertising account planner. What are the signs or symptoms? Pain and deformity of the toe are the main symptoms of this condition. The pain is worse when wearing shoes, walking, or running. Other symptoms may include: A thickened patch of skin, called a corn or callus, that forms over the top of the bent part of the toe or between the toes. Redness and a burning feeling on the bent toe. An  open sore that forms on the top of the bent toe. Not being able to straighten the affected toe. How is this diagnosed? This condition is diagnosed based on your symptoms and a physical exam. During the exam, your health care provider will try to straighten your toe to see how stiff the deformity is. You may also have tests, such as: A blood test to check for rheumatoid arthritis or diabetes. An X-ray to show how severe the toe deformity is. How is this treated? Treatment for this condition depends on whether the toe is flexible or deformed and no longer moveable. In less severe cases, a hammer toe can be straightened without surgery. These treatments include: Taping the toe into a straightened position. Using pads and cushions to protect the bent toe. Wearing shoes that provide enough room for the toes. Doing toe-stretching exercises at home. Taking an NSAID, such as ibuprofen, to reduce pain and swelling. Using special orthotics or insoles for pain relief and to improve walking. If these treatments do not help or the toe has a severe deformity and cannot be straightened, surgery is the next option. The most common surgeries used to straighten a hammer toe include: Arthroplasty or osteotomy. Part of the toe joint is reconstructed or removed, which allows the toe to straighten. Fusion. Cartilage between the two bones of the joint is taken out, and the bones are fused together into one longer bone. Implantation. Part of the bone is removed and  replaced with an implant to allow the toe to move again. Flexor tendon transfer. The tendons that curl the toes down (flexor tendons) are repositioned. Follow these instructions at home: Take over-the-counter and prescription medicines only as told by your health care provider. Do toe-straightening and stretching exercises as told by your health care provider. Keep all follow-up visits. This is important. How is this prevented? Wear shoes that fit  properly and give your toes enough room. Shoes should not cause pain. Buy shoes at the end of the day to make sure they fit well, since your foot may swell during the day. Make sure they are comfortable before you buy them. As you age, your shoe size might change, including the width. Measure both feet and buy shoes for the larger foot. A shoe repair store might be able to stretch shoes that feel tight in spots. Do not wear high-heeled shoes or shoes with pointed toes. Contact a health care provider if: Your pain gets worse. Your toe becomes red or swollen. You develop an open sore on your toe. Summary Hammer toe is a condition that gradually causes your toe to become bent and stiff. Hammer toe can be treated by taping the toe into a straightened position and doing toe-stretching exercises. If these treatments do not help, surgery may be needed. To prevent this condition, wear shoes that fit properly, give your toes enough room, and do not cause pain. This information is not intended to replace advice given to you by your health care provider. Make sure you discuss any questions you have with your health care provider. Document Revised: 03/27/2019 Document Reviewed: 03/27/2019 Elsevier Patient Education  2022 [employer].

## 2021-02-04 ENCOUNTER — Telehealth: Payer: Self-pay | Admitting: *Deleted

## 2021-02-04 NOTE — Telephone Encounter (Signed)
Caleb Landry with [facility] is calling for clarification on medication that was sent to a different [facility] pharmacy(nystatin triamcinolone ointment).  Returned the call to explain that it was sent to [facility].  Called the patient to inform and he said that he does not have money to both anyway just picked the Kenalog, will pick up the other when money becomes available.

## 2021-02-05 NOTE — Progress Notes (Signed)
Subjective:   Patient ID: Caleb Landry, male   DOB: 51 y.o.   MRN: [id number]   HPI 51 year old male presents the office today for concerns of bilateral hammertoes which has been on for the last 2 years.  Right side worse than left.  He states that he saw another podiatrist and was scheduled for surgery but he had to move and he lost his insurance.  He has tried shoe modifications, offloading in the emergency does cause discomfort particularly with shoes and pressure.  No skin breakdown on the toes.  He also has a rash that he has noticed on the lateral aspect of the leg which does itch some.  No skin breakdown, drainage.  Also concerned about nail fungus.  He does have neuropathy he is on gabapentin 3 mg twice a day.  After the nails be trimmed today as well side effect elongated he cannot do them himself.  No open sores otherwise.  No other concerns.   Review of Systems  All other systems reviewed and are negative.  Past Medical History:  Diagnosis Date   Asthma    Neuropathy     Past Surgical History:  Procedure Laterality Date   APPENDECTOMY     HERNIA REPAIR     RIGHT/LEFT HEART CATH AND CORONARY ANGIOGRAPHY N/A 07/19/2020   Procedure: RIGHT/LEFT HEART CATH AND CORONARY ANGIOGRAPHY;  Surgeon: Jolaine Artist, MD;  Location: [facility];  Service: Cardiovascular;  Laterality: N/A;     Current Outpatient Medications:    albuterol (PROVENTIL) (2.5 MG/3ML) 0.083% nebulizer solution, Take 3 mLs (2.5 mg total) by nebulization every 6 (six) hours as needed for wheezing or shortness of breath., Disp: 150 mL, Rfl: 1   albuterol (VENTOLIN HFA) 108 (90 Base) MCG/ACT inhaler, Inhale 2 puffs into the lungs every 6 (six) hours as needed for wheezing or shortness of breath., Disp: 8.5 g, Rfl: 2   bisoprolol (ZEBETA) 5 MG tablet, Take 0.5 tablets (2.5 mg total) by mouth daily., Disp: 34 tablet, Rfl: 2   empagliflozin (JARDIANCE) 10 MG TABS tablet, Take 1 tablet (10 mg total) by mouth  daily., Disp: 30 tablet, Rfl: 1   famotidine (PEPCID) 40 MG tablet, Take 0.5 tablets (20 mg total) by mouth at bedtime for 5 days., Disp: 3 tablet, Rfl: 0   furosemide (LASIX) 40 MG tablet, Take 1 tablet (40 mg total) by mouth daily as needed., Disp: 30 tablet, Rfl: 6   gabapentin (NEURONTIN) 300 MG capsule, Take 1 capsule (300 mg total) by mouth 2 (two) times daily., Disp: 60 capsule, Rfl: 5   nystatin-triamcinolone ointment (MYCOLOG), Apply 1 application topically 2 (two) times daily. Apply to toenails, Disp: 30 g, Rfl: 0   potassium chloride SA (KLOR-CON M) 20 MEQ tablet, Take 1 tablet (20 mEq total) by mouth daily as needed. Take when you take your lasix, Disp: 30 tablet, Rfl: 2   sacubitril-valsartan (ENTRESTO) 49-51 MG, Take 1 tablet by mouth 2 (two) times daily., Disp: 180 tablet, Rfl: 3   spironolactone (ALDACTONE) 25 MG tablet, Take 1 tablet (25 mg total) by mouth daily., Disp: 30 tablet, Rfl: 6   triamcinolone (KENALOG) 0.025 % ointment, Apply 1 application topically 2 (two) times daily. Apply to skin rash, Disp: 30 g, Rfl: 0  Allergies  Allergen Reactions   Penicillins Hives          Objective:  Physical Exam  General: AAO x3, NAD  Dermatological: On the lateral aspect of the right leg there is  a erythematous rash present without any skin breakdown, warmth.  No drainage or pus.  No increase in warmth.  There is no open lesions otherwise.  Nails appear to be hypertrophic and dystrophic with yellow discoloration.  Tenderness nails 1-5 bilaterally.  Hyperkeratotic tissue at distal aspect bilateral second toes without any underlying ulceration or signs of infection.  Vascular: Dorsalis Pedis artery and Posterior Tibial artery pedal pulses are 2/4 bilateral with immedate capillary fill time. There is no pain with calf compression, swelling, warmth, erythema.   Neruologic: Sensation mildly decreased with Semmes Weinstein monofilament  Musculoskeletal: Semirigid hammertoe  contractures present bilateral digits 1 through 5 with a right side worse than left.  There is palpation of dorsal IPJ's.  No other areas of discomfort.  MMT 5/5.  Gait: Unassisted, Nonantalgic.       Assessment:   51 year old male with symptomatic hammertoes, symptomatic onychomycosis, dermatitis right skin rash right leg     Plan:  -Treatment options discussed including all alternatives, risks, and complications -Etiology of symptoms were discussed -X-rays were obtained and reviewed with the patient.  Hammertoe contractures present.  No evidence of acute fracture. -Evidence of hammertoes he is interested in surgical intervention.  Discussed with hammertoe repair.  Discussed the surgery as well as postoperative course.  We will try to obtain medical clearance prior to scheduling.  In the meantime discussion modifications with a wider, deeper toe box as well as offloading pads. -For the skin rash prescribe triamcinolone cream -Sharp debrided the nails x10 without any complications or bleeding given the discomfort.  Prescribed nystatin/triamcinolone cream for this. -Continue gabapentin for neuropathy

## 2021-02-08 ENCOUNTER — Encounter (HOSPITAL_COMMUNITY): Payer: Self-pay

## 2021-02-08 ENCOUNTER — Encounter: Payer: Self-pay | Admitting: Podiatry

## 2021-02-08 ENCOUNTER — Other Ambulatory Visit (HOSPITAL_COMMUNITY): Payer: Self-pay

## 2021-02-08 MED ORDER — ENTRESTO 49-51 MG PO TABS: 1.0000 | ORAL_TABLET | Freq: Two times a day (BID) | ORAL | 3 refills | Status: DC

## 2021-02-09 ENCOUNTER — Encounter: Payer: Self-pay | Admitting: Podiatry

## 2021-02-10 ENCOUNTER — Other Ambulatory Visit (HOSPITAL_COMMUNITY): Payer: Self-pay

## 2021-02-11 ENCOUNTER — Encounter: Payer: Self-pay | Admitting: Nurse Practitioner

## 2021-02-11 ENCOUNTER — Telehealth: Payer: Self-pay

## 2021-02-11 NOTE — Telephone Encounter (Signed)
Pt called in and stated that he need's your CLEARANCE for his foot surgery!

## 2021-02-14 ENCOUNTER — Encounter (HOSPITAL_COMMUNITY): Payer: Self-pay

## 2021-02-14 ENCOUNTER — Telehealth (HOSPITAL_COMMUNITY): Payer: Self-pay | Admitting: Pharmacy Technician

## 2021-02-14 ENCOUNTER — Other Ambulatory Visit (HOSPITAL_COMMUNITY): Payer: Self-pay

## 2021-02-14 ENCOUNTER — Ambulatory Visit (HOSPITAL_COMMUNITY)
Admission: RE | Admit: 2021-02-14 | Discharge: 2021-02-14 | Disposition: A | Discharge status: Home / Self Care | Payer: Medicaid Other | Source: Ambulatory Visit | Attending: Internal Medicine | Admitting: Internal Medicine

## 2021-02-14 ENCOUNTER — Other Ambulatory Visit: Payer: Self-pay

## 2021-02-14 DIAGNOSIS — I5022 Chronic systolic (congestive) heart failure: Secondary | ICD-10-CM | POA: Insufficient documentation

## 2021-02-14 LAB — BASIC METABOLIC PANEL
Anion gap: 6 (ref 5–15)
BUN: 11 mg/dL (ref 6–20)
CO2: 29 mmol/L (ref 22–32)
Calcium: 9.1 mg/dL (ref 8.9–10.3)
Chloride: 103 mmol/L (ref 98–111)
Creatinine, Ser: 1.06 mg/dL (ref 0.61–1.24)
GFR, Estimated: >60 mL/min (ref >60)
Glucose, Bld: 102 mg/dL — ABNORMAL HIGH (ref 70–99)
Potassium: 4.4 mmol/L (ref 3.5–5.1)
Sodium: 138 mmol/L (ref 135–145)

## 2021-02-14 NOTE — Telephone Encounter (Signed)
Advanced Heart Failure Patient Advocate Encounter  Patient inquired why Entresto hasnt been sent to him. It looks like his assistance has ended and was not renewed. Patient has Pharmacist, community now, 90 day copay is $45. Advised Emer (EN) to provide the patient with a $10 copay card and send a 90 day rx to his preferred pharmacy.  Charlann Boxer, CPhT

## 2021-02-14 NOTE — Progress Notes (Unsigned)
Medication Samples have been provided to the patient.  Drug name: Sherryll Burger       Strength: 49/51 mg        Qty: 1  LOT: [id number]  Exp.Date: 01/2023  Dosing instructions: Take 1 tablet Twice daily   The patient has been instructed regarding the correct time, dose, and frequency of taking this medication, including desired effects and most common side effects.   Smitty Cords Wil Slape 11:02 AM 02/14/2021

## 2021-02-18 ENCOUNTER — Other Ambulatory Visit: Payer: Self-pay

## 2021-02-18 ENCOUNTER — Telehealth: Payer: Self-pay | Admitting: *Deleted

## 2021-02-18 ENCOUNTER — Ambulatory Visit (INDEPENDENT_AMBULATORY_CARE_PROVIDER_SITE_OTHER): Payer: 59 | Admitting: Podiatry

## 2021-02-18 DIAGNOSIS — M2041 Other hammer toe(s) (acquired), right foot: Secondary | ICD-10-CM | POA: Diagnosis not present

## 2021-02-18 DIAGNOSIS — E119 Type 2 diabetes mellitus without complications: Secondary | ICD-10-CM | POA: Diagnosis not present

## 2021-02-18 DIAGNOSIS — I739 Peripheral vascular disease, unspecified: Secondary | ICD-10-CM

## 2021-02-18 DIAGNOSIS — M2042 Other hammer toe(s) (acquired), left foot: Secondary | ICD-10-CM

## 2021-02-18 NOTE — Patient Instructions (Signed)
Pre-Operative Instructions  Congratulations, you have decided to take an important step to improving your quality of life.  You can be assured that the doctors of [facility] will be with you every step of the way.  Plan to be at the surgery center/hospital at least 1 (one) hour prior to your scheduled time unless otherwise directed by the surgical center/hospital staff.  You must have a responsible adult accompany you, remain during the surgery and drive you home.  Make sure you have directions to the surgical center/hospital and know how to get there on time. For hospital based surgery you will need to obtain a history and physical form from your family physician within 1 month prior to the date of surgery- we will give you a form for you primary physician.  We make every effort to accommodate the date you request for surgery.  There are however, times where surgery dates or times have to be moved.  We will contact you as soon as possible if a change in schedule is required.   No Aspirin/Ibuprofen for one week before surgery.  If you are on aspirin, any non-steroidal anti-inflammatory medications (Mobic, Aleve, Ibuprofen) you should stop taking it 7 days prior to your surgery.  You make take Tylenol  For pain prior to surgery.  Medications- If you are taking daily heart and blood pressure medications, seizure, reflux, allergy, asthma, anxiety, pain or diabetes medications, make sure the surgery center/hospital is aware before the day of surgery so they may notify you which medications to take or avoid the day of surgery. No food or drink after midnight the night before surgery unless directed otherwise by surgical center/hospital staff. No alcoholic beverages 24 hours prior to surgery.  No smoking 24 hours prior to or 24 hours after surgery. Wear loose pants or shorts- loose enough to fit over bandages, boots, and casts. No slip on shoes, sneakers are best. Bring your boot with you to the  surgery center/hospital.  Also bring crutches or a walker if your physician has prescribed it for you.  If you do not have this equipment, it will be provided for you after surgery. If you have not been contracted by the surgery center/hospital by the day before your surgery, call to confirm the date and time of your surgery. Leave-time from work may vary depending on the type of surgery you have.  Appropriate arrangements should be made prior to surgery with your employer. Prescriptions will be provided immediately following surgery by your doctor.  Have these filled as soon as possible after surgery and take the medication as directed. Remove nail polish on the operative foot. Wash the night before surgery.  The night before surgery wash the foot and leg well with the antibacterial soap provided and water paying special attention to beneath the toenails and in between the toes.  Rinse thoroughly with water and dry well with a towel.  Perform this wash unless told not to do so by your physician.  Enclosed: 1 Ice pack (please put in freezer the night before surgery)   1 Hibiclens skin cleaner   Pre-op Instructions  If you have any questions regarding the instructions, do not hesitate to call our office at any point during this process.   [place]: [address] [place], [place] [address] [phone]  [place]: [address], [place], [place] [address] [phone]  Dr. Ovid Curd, DPM Managing the Challenge of Quitting Smoking Quitting smoking is a physical and mental challenge. You will  face cravings, withdrawal symptoms, and temptation. Before quitting, work with your health care provider to make a plan that can help you manage quitting. Preparation can help you quit and keep you from giving in. How to manage lifestyle changes Managing stress Stress can make you want to smoke, and wanting to smoke may cause stress. It is important to find ways to manage your stress. You might try some  of the following: Practice relaxation techniques. Breathe slowly and deeply, in through your nose and out through your mouth. Listen to music. Soak in a bath or take a shower. Imagine a peaceful place or vacation. Get some support. Talk with family or friends about your stress. Join a support group. Talk with a counselor or therapist. Get some physical activity. Go for a walk, run, or bike ride. Play a favorite sport. Practice yoga.  Medicines Talk with your health care provider about medicines that might help you deal with cravings and make quitting easier for you. Relationships Social situations can be difficult when you are quitting smoking. To manage this, you can: Avoid parties and other social situations where people might be smoking. Avoid alcohol. Leave right away if you have the urge to smoke. Explain to your family and friends that you are quitting smoking. Ask for support and let them know you might be a bit grumpy. Plan activities where smoking is not an option. General instructions Be aware that many people gain weight after they quit smoking. However, not everyone does. To keep from gaining weight, have a plan in place before you quit and stick to the plan after you quit. Your plan should include: Having healthy snacks. When you have a craving, it may help to: Eat popcorn, carrots, celery, or other cut vegetables. Chew sugar-free gum. Changing how you eat. Eat small portion sizes at meals. Eat 4-6 small meals throughout the day instead of 1-2 large meals a day. Be mindful when you eat. Do not watch television or do other things that might distract you as you eat. Exercising regularly. Make time to exercise each day. If you do not have time for a long workout, do short bouts of exercise for 5-10 minutes several times a day. Do some form of strengthening exercise, such as weight lifting. Do some exercise that gets your heart beating and causes you to breathe deeply,  such as walking fast, running, swimming, or biking. This is very important. Drinking plenty of water or other low-calorie or no-calorie drinks. Drink 6-8 glasses of water daily.  How to recognize withdrawal symptoms Your body and mind may experience discomfort as you try to get used to not having nicotine in your system. These effects are called withdrawal symptoms. They may include: Feeling hungrier than normal. Having trouble concentrating. Feeling irritable or restless. Having trouble sleeping. Feeling depressed. Craving a cigarette. To manage withdrawal symptoms: Avoid places, people, and activities that trigger your cravings. Remember why you want to quit. Get plenty of sleep. Avoid coffee and other caffeinated drinks. These may worsen some of your symptoms. These symptoms may surprise you. But be assured that they are normal to have when quitting smoking. How to manage cravings Come up with a plan for how to deal with your cravings. The plan should include the following: A definition of the specific situation you want to deal with. An alternative action you will take. A clear idea for how this action will help. The name of someone who might help you with this. Cravings usually last  for 5-10 minutes. Consider taking the following actions to help you with your plan to deal with cravings: Keep your mouth busy. Chew sugar-free gum. Suck on hard candies or a straw. Brush your teeth. Keep your hands and body busy. Change to a different activity right away. Squeeze or play with a ball. Do an activity or a hobby, such as making bead jewelry, practicing needlepoint, or working with wood. Mix up your normal routine. Take a short exercise break. Go for a quick walk or run up and down stairs. Focus on doing something kind or helpful for someone else. Call a friend or family member to talk during a craving. Join a support group. Contact a quitline. Where to find support To get help or  find a support group: Call the National Cancer Institute's Smoking Quitline: 1-800-QUIT NOW 470 437 7746) Visit the website of the Substance Abuse and Mental Health Services Administration: SkateOasis.com.pt Text QUIT to SmokefreeTXT: 151761 Where to find more information Visit these websites to find more information on quitting smoking: National Cancer Institute: www.smokefree.gov American Lung Association: www.lung.org American Cancer Society: www.cancer.org Centers for Disease Control and Prevention: FootballExhibition.com.br American Heart Association: www.heart.org Contact a health care provider if: You want to change your plan for quitting. The medicines you are taking are not helping. Your eating feels out of control or you cannot sleep. Get help right away if: You feel depressed or become very anxious. Summary Quitting smoking is a physical and mental challenge. You will face cravings, withdrawal symptoms, and temptation to smoke again. Preparation can help you as you go through these challenges. Try different techniques to manage stress, handle social situations, and prevent weight gain. You can deal with cravings by keeping your mouth busy (such as by chewing gum), keeping your hands and body busy, calling family or friends, or contacting a quitline for people who want to quit smoking. You can deal with withdrawal symptoms by avoiding places where people smoke, getting plenty of rest, and avoiding drinks with caffeine. This information is not intended to replace advice given to you by your health care provider. Make sure you discuss any questions you have with your health care provider. Document Revised: 08/27/2020 Document Reviewed: 10/08/2018 Elsevier Patient Education  2022 [employer].

## 2021-02-18 NOTE — Telephone Encounter (Signed)
Patient is required to have prior authorization for the [place] ABI-cpt [id number]. Please contact Para March [phone] once completed so that he may contact patient to schedule.

## 2021-02-21 NOTE — Progress Notes (Signed)
Subjective: 51 year old male presents the office today to further discuss hammertoes on his right foot and he wants to have surgery to have the fix.  He states they are causing discomfort is tried shoe modifications, offloading pads but significant improvement.  He states he went to his doctor he has clearance for the surgery.  Currently he does smoke.  Objective: AAO x3, NAD DP/PT pulses palpable bilaterally, CRT less than 3 seconds No open lesions are noted today. Semirigid hammertoe contractures present to digits 1 through 5 bilaterally with the right side worse than the left.  Tenderness palpation along the PIPJ's of the lesser digits and the IPJ of the hallux, rub inside shoes causing discomfort.  They are semirigid.  MMT 5/5. No pain with calf compression, swelling, warmth, erythema  Assessment: Symptomatic hammertoes  Plan: -All treatment options discussed with the patient including all alternatives, risks, complications.  -Again reviewed the x-rays with him I discussed both conservative as well as surgical treatment options.  He states he has tried numerous conservative treatments for any significant resolution he does want to proceed with surgery. -Discussed with him hammertoe repair digits 1 through 5 with K wire, screw fixation -The incision placement as well as the postoperative course was discussed with the patient. I discussed risks of the surgery which include, but not limited to, infection, bleeding, pain, swelling, need for further surgery, delayed or nonhealing, painful or ugly scar, numbness or sensation changes, over/under correction, recurrence, transfer lesions, further deformity, hardware failure, DVT/PE, loss of toe/foot. Patient understands these risks and wishes to proceed with surgery. The surgical consent was reviewed with the patient all 3 pages were signed. No promises or guarantees were given to the outcome of the procedure. All questions were answered to the best of  my ability. Before the surgery the patient was encouraged to call the office if there is any further questions. The surgery will be performed at the [facility] on an outpatient basis. -Smoking cessation discussed-stressed the importance of stopping smoking prior to surgery as this does increase his risk of complications such as infection, amputation. -ABI preop -Blood work ordered for pre-op -CBC, BMP, A1c -Patient encouraged to call the office with any questions, concerns, change in symptoms.   Trula Slade DPM

## 2021-02-22 ENCOUNTER — Telehealth: Payer: Self-pay | Admitting: Urology

## 2021-02-22 LAB — CBC WITH DIFFERENTIAL/PLATELET
Absolute Monocytes: 570 cells/uL (ref 200–950)
Basophils Absolute: 47 cells/uL (ref 0–200)
Basophils Relative: 0.7 %
Eosinophils Absolute: 141 cells/uL (ref 15–500)
Eosinophils Relative: 2.1 %
HCT: 46.2 % (ref 38.5–50.0)
Hemoglobin: 15.7 g/dL (ref 13.2–17.1)
Lymphs Abs: 1762 cells/uL (ref 850–3900)
MCH: 31.4 pg (ref 27.0–33.0)
MCHC: 34 g/dL (ref 32.0–36.0)
MCV: 92.4 fL (ref 80.0–100.0)
MPV: 9.4 fL (ref 7.5–12.5)
Monocytes Relative: 8.5 %
Neutro Abs: 4181 cells/uL (ref 1500–7800)
Neutrophils Relative %: 62.4 %
Platelets: 356 10*3/uL (ref 140–400)
RBC: 5 10*6/uL (ref 4.20–5.80)
RDW: 12.9 % (ref 11.0–15.0)
Total Lymphocyte: 26.3 %
WBC: 6.7 10*3/uL (ref 3.8–10.8)

## 2021-02-22 LAB — BASIC METABOLIC PANEL
BUN: 14 mg/dL (ref 7–25)
CO2: 33 mmol/L — ABNORMAL HIGH (ref 20–32)
Calcium: 10.3 mg/dL (ref 8.6–10.3)
Chloride: 99 mmol/L (ref 98–110)
Creat: 1.01 mg/dL (ref 0.60–1.29)
Glucose, Bld: 73 mg/dL (ref 65–99)
Potassium: 4.9 mmol/L (ref 3.5–5.3)
Sodium: 139 mmol/L (ref 135–146)

## 2021-02-22 LAB — HEMOGLOBIN A1C
Hgb A1c MFr Bld: 5.5 % of total Hgb (ref <5.7)
Mean Plasma Glucose: 111 mg/dL
eAG (mmol/L): 6.2 mmol/L

## 2021-02-22 NOTE — Telephone Encounter (Addendum)
DOS - 03/09/21  HAMMERTOE REPAIR 1-5 RIGHT --- [id number]  [employer] EFFECTIVE DATE - 02/02/21  RECEIVED FAX FROM [employer] STATING THAT CPT CODES 79024 HAS BEEN APPROVED, AUTH # [id number], GOOD FROM 02/22/21 - 05/22/21.  I called to clarify since the fax only said one on the paper and she stated that they do it as one day so it's how every many units we need in that one day. I asked if they could change it so it would say the number of units that I requested, and she just repeated that they do it by days so the units that the doctor needs are covered in that one day.

## 2021-02-25 ENCOUNTER — Other Ambulatory Visit (HOSPITAL_COMMUNITY): Payer: Self-pay | Admitting: *Deleted

## 2021-02-25 ENCOUNTER — Other Ambulatory Visit: Payer: Self-pay

## 2021-02-25 ENCOUNTER — Other Ambulatory Visit (HOSPITAL_COMMUNITY): Payer: Self-pay

## 2021-02-25 MED ORDER — ENTRESTO 49-51 MG PO TABS: 1.0000 | ORAL_TABLET | Freq: Two times a day (BID) | ORAL | 3 refills | Status: DC

## 2021-02-25 MED ORDER — SPIRONOLACTONE 25 MG PO TABS: 25.0000 mg | ORAL_TABLET | Freq: Every day | ORAL | 6 refills | Status: DC

## 2021-02-25 MED ORDER — BISOPROLOL FUMARATE 5 MG PO TABS: 2.5000 mg | ORAL_TABLET | Freq: Every day | ORAL | 2 refills | Status: DC

## 2021-02-28 ENCOUNTER — Telehealth: Payer: Self-pay

## 2021-02-28 NOTE — Telephone Encounter (Signed)
Gabapentin Albuterol nebulizer Albuterol inhaler  [facility] [address]

## 2021-02-28 NOTE — Telephone Encounter (Signed)
Pt refills were sent to his pharmacy back on 01/24/21 with extra refills. Pt will need to call his pharmacy.

## 2021-03-01 ENCOUNTER — Other Ambulatory Visit: Payer: Self-pay

## 2021-03-01 ENCOUNTER — Ambulatory Visit (HOSPITAL_COMMUNITY)
Admission: RE | Admit: 2021-03-01 | Discharge: 2021-03-01 | Disposition: A | Discharge status: Home / Self Care | Payer: Self-pay | Source: Ambulatory Visit | Attending: Podiatry | Admitting: Podiatry

## 2021-03-01 DIAGNOSIS — I739 Peripheral vascular disease, unspecified: Secondary | ICD-10-CM

## 2021-03-03 NOTE — Telephone Encounter (Signed)
Albuterol inhaler ?Gabapentin ? ?

## 2021-03-04 ENCOUNTER — Other Ambulatory Visit: Payer: Self-pay | Admitting: Nurse Practitioner

## 2021-03-04 ENCOUNTER — Telehealth: Payer: Self-pay | Admitting: Nurse Practitioner

## 2021-03-04 DIAGNOSIS — G629 Polyneuropathy, unspecified: Secondary | ICD-10-CM

## 2021-03-04 DIAGNOSIS — J4520 Mild intermittent asthma, uncomplicated: Secondary | ICD-10-CM

## 2021-03-04 MED ORDER — ALBUTEROL SULFATE (2.5 MG/3ML) 0.083% IN NEBU: 2.5000 mg | INHALATION_SOLUTION | Freq: Four times a day (QID) | RESPIRATORY_TRACT | 1 refills | Status: DC | PRN

## 2021-03-04 MED ORDER — GABAPENTIN 300 MG PO CAPS: 300.0000 mg | ORAL_CAPSULE | Freq: Two times a day (BID) | ORAL | 5 refills | Status: DC

## 2021-03-04 MED ORDER — ALBUTEROL SULFATE HFA 108 (90 BASE) MCG/ACT IN AERS: 2.0000 | INHALATION_SPRAY | Freq: Four times a day (QID) | RESPIRATORY_TRACT | 2 refills | Status: DC | PRN

## 2021-03-09 ENCOUNTER — Other Ambulatory Visit: Payer: Self-pay | Admitting: Podiatry

## 2021-03-09 ENCOUNTER — Telehealth (HOSPITAL_COMMUNITY): Payer: Self-pay

## 2021-03-09 ENCOUNTER — Encounter: Payer: Self-pay | Admitting: Podiatry

## 2021-03-09 DIAGNOSIS — M2041 Other hammer toe(s) (acquired), right foot: Secondary | ICD-10-CM | POA: Diagnosis not present

## 2021-03-09 MED ORDER — CLINDAMYCIN HCL 300 MG PO CAPS: 300.0000 mg | ORAL_CAPSULE | Freq: Three times a day (TID) | ORAL | 0 refills | Status: DC

## 2021-03-09 MED ORDER — HYDROCODONE-ACETAMINOPHEN 5-325 MG PO TABS: 1.0000 | ORAL_TABLET | Freq: Four times a day (QID) | ORAL | 0 refills | Status: DC | PRN

## 2021-03-09 NOTE — Progress Notes (Signed)
Postop medications sent 

## 2021-03-09 NOTE — Telephone Encounter (Signed)
Received a fax requesting medical records from [facility]. Records were successfully faxed to: [phone] ,which was the number provided.. Medical request form will be scanned into patients chart.  ? ?

## 2021-03-10 ENCOUNTER — Telehealth: Payer: Self-pay | Admitting: Podiatry

## 2021-03-10 ENCOUNTER — Other Ambulatory Visit: Payer: Self-pay | Admitting: Podiatry

## 2021-03-10 MED ORDER — OXYCODONE-ACETAMINOPHEN 5-325 MG PO TABS: 1.0000 | ORAL_TABLET | Freq: Four times a day (QID) | ORAL | 0 refills | Status: DC | PRN

## 2021-03-10 NOTE — Telephone Encounter (Signed)
Patient would like to know if he can get something stronger the pain medication you gave him is not cutting the pain .   Please advise  Pharmacy : [facility] on [address]

## 2021-03-11 NOTE — Telephone Encounter (Signed)
Called patient and left vm message the medication was called in .

## 2021-03-14 ENCOUNTER — Ambulatory Visit (INDEPENDENT_AMBULATORY_CARE_PROVIDER_SITE_OTHER): Payer: 59 | Admitting: Podiatry

## 2021-03-14 ENCOUNTER — Ambulatory Visit (INDEPENDENT_AMBULATORY_CARE_PROVIDER_SITE_OTHER): Payer: 59

## 2021-03-14 ENCOUNTER — Other Ambulatory Visit: Payer: Self-pay

## 2021-03-14 DIAGNOSIS — Z9889 Other specified postprocedural states: Secondary | ICD-10-CM | POA: Diagnosis not present

## 2021-03-14 DIAGNOSIS — M2041 Other hammer toe(s) (acquired), right foot: Secondary | ICD-10-CM

## 2021-03-14 DIAGNOSIS — E119 Type 2 diabetes mellitus without complications: Secondary | ICD-10-CM

## 2021-03-14 NOTE — Progress Notes (Signed)
Subjective: ?Caleb Landry is a 51 y.o. is seen today in office s/p hammertoe repair of digits 1 through 5 on the right foot preformed on 03/09/2021.  States he has been doing well.  His pain is improved.  His pain level is 5/10 at max.  He has been in the cam boot he has been nonweightbearing using crutches.  Denies any fevers or chills.  No chest pain shortness of breath.  No other concerns.   ? ?Objective: ?General: No acute distress, AAOx3  ?DP/PT pulses palpable 2/4, CRT < 3 sec to all digits.  ?Protective sensation intact. Motor function intact.  ?Right foot: Incision is well coapted without any evidence of dehiscence with sutures intact.. There is no surrounding erythema, ascending cellulitis, fluctuance, crepitus, malodor, drainage/purulence. There is mild edema around the surgical site. There is mild pain along the surgical site.  Toes in rectus position K wires are intact with any drainage or pus or signs of infection. ?No other areas of tenderness to bilateral lower extremities.  ?No other open lesions or pre-ulcerative lesions.  ?No pain with calf compression, swelling, warmth, erythema.  ? ?Assessment and Plan:  ?Status post right foot hammertoe repair, doing well with no complications  ? ?-Treatment options discussed including all alternatives, risks, and complications ?-X-rays obtained reviewed.  There is no evidence of acute fracture or stress fracture.  Hardware intact.  There is scabbing present to the hallux IPJ but toe clinically is in rectus position. ?-I cleaned the incisions.  Antibiotic ointment and a bandage applied.  He can keep the dressings clean, dry, intact and normal. ?-Ice/elevation ?-Pain medication as needed. ?-Monitor for any clinical signs or symptoms of infection and DVT/PE and directed to call the office immediately should any occur or go to the ER. ?-Follow-up as scheduled for possible or sooner if any problems arise. In the meantime, encouraged to call the office with any  questions, concerns, change in symptoms.  ? ?Celesta Gentile, DPM ? ?

## 2021-03-22 ENCOUNTER — Other Ambulatory Visit: Payer: Self-pay

## 2021-03-22 ENCOUNTER — Ambulatory Visit (INDEPENDENT_AMBULATORY_CARE_PROVIDER_SITE_OTHER): Payer: 59

## 2021-03-22 ENCOUNTER — Ambulatory Visit (INDEPENDENT_AMBULATORY_CARE_PROVIDER_SITE_OTHER): Payer: 59 | Admitting: Podiatry

## 2021-03-22 DIAGNOSIS — M2041 Other hammer toe(s) (acquired), right foot: Secondary | ICD-10-CM | POA: Diagnosis not present

## 2021-03-22 DIAGNOSIS — Z9889 Other specified postprocedural states: Secondary | ICD-10-CM

## 2021-03-22 MED ORDER — OXYCODONE-ACETAMINOPHEN 5-325 MG PO TABS: 1.0000 | ORAL_TABLET | Freq: Three times a day (TID) | ORAL | 0 refills | Status: DC | PRN

## 2021-03-24 ENCOUNTER — Encounter: Payer: 59 | Admitting: Podiatry

## 2021-03-28 ENCOUNTER — Telehealth: Payer: Self-pay | Admitting: *Deleted

## 2021-03-28 NOTE — Telephone Encounter (Signed)
Patient is calling forstatus of  a pain medicine refill that was supposed to be sent last visit. Please advise. ?

## 2021-03-29 DIAGNOSIS — M2041 Other hammer toe(s) (acquired), right foot: Secondary | ICD-10-CM | POA: Insufficient documentation

## 2021-03-29 NOTE — Progress Notes (Signed)
Subjective: ?Caleb Landry is a 51 y.o. is seen today in office s/p hammertoe repair of digits 1 through 5 on the right foot preformed on 03/09/2021.  Presents the office today as he is concerned that the wires are coming out.  He still has some pain.  He is taking the pain medicine and asking for refill to get later this week.  Denies any fevers or chills.  No nausea or vomiting.  No chest pain or shortness of breath.  No other concerns.   ? ?Objective: ?General: No acute distress, AAOx3  ?DP/PT pulses palpable 2/4, CRT < 3 sec to all digits.  ?Protective sensation intact. Motor function intact.  ?Right foot: Incision is well coapted without any evidence of dehiscence with sutures intact..  K wires are intact.  They have turned some and on the third digit the wire has come out but is still across the PIPJ.  There is mild edema to the toe despite any erythema, drainage or pus or any signs of infection.  Mild tenderness palpation at surgical sites.  The toes are in a rectus position. ?No other open lesions or pre-ulcerative lesions.  ?No pain with calf compression, swelling, warmth, erythema.  ? ?Assessment and Plan:  ?Status post right foot hammertoe repair, doing well with no complications  ? ?-Treatment options discussed including all alternatives, risks, and complications ?-X-rays obtained reviewed.  The K wire in the third toe has come out but still across the PIPJ. ?-I left the wire intact but I did pad the area well.  Antibiotic ointment is applied to the incision sites as well as the K wire sites. ?-Continue cam boot, ice, elevation. ?-Pain medicine ordered to pick up on Friday from the pharmacy. ?-Monitor for any clinical signs or symptoms of infection and directed to call the office immediately should any occur or go to the ER. ? ?Trula Slade DPM ? ?

## 2021-03-30 ENCOUNTER — Telehealth: Payer: Self-pay | Admitting: Nurse Practitioner

## 2021-03-30 NOTE — Telephone Encounter (Signed)
Pt left VM on nurse line about infection in groin. An appt was made on 04/05/21 to see Caleb Landry. ?

## 2021-03-31 NOTE — Telephone Encounter (Signed)
Called the pharmacy and the patient pick up on 27 th for 15 tablets.

## 2021-04-01 ENCOUNTER — Ambulatory Visit (INDEPENDENT_AMBULATORY_CARE_PROVIDER_SITE_OTHER): Payer: 59

## 2021-04-01 DIAGNOSIS — M2041 Other hammer toe(s) (acquired), right foot: Secondary | ICD-10-CM

## 2021-04-01 NOTE — Progress Notes (Signed)
Patient in office today for suture removal. Patient denies nausea, vomiting, fever and chills. Sutures were removed from the right foot digits 1-5 without complication. Right foot was dressed with DSD after suture removal. Advised patient to call the office with any questions comments, or concerns. Advised to follow up with Dr. Ardelle Anton for Pin removal in 2 weeks.  ?

## 2021-04-05 ENCOUNTER — Ambulatory Visit (INDEPENDENT_AMBULATORY_CARE_PROVIDER_SITE_OTHER): Payer: Self-pay | Admitting: Nurse Practitioner

## 2021-04-05 ENCOUNTER — Encounter: Payer: Self-pay | Admitting: Nurse Practitioner

## 2021-04-05 VITALS — BP 94/65 | HR 92 | Temp 98.4°F | Ht 70.0 in | Wt 179.0 lb

## 2021-04-05 DIAGNOSIS — N4821 Abscess of corpus cavernosum and penis: Secondary | ICD-10-CM

## 2021-04-05 LAB — POCT URINALYSIS DIP (CLINITEK)
Bilirubin, UA: NEGATIVE
Blood, UA: NEGATIVE
Glucose, UA: 500 mg/dL — AB
Ketones, POC UA: NEGATIVE mg/dL
Nitrite, UA: POSITIVE — AB
Spec Grav, UA: 1.02 (ref 1.010–1.025)
Urobilinogen, UA: 0.2 E.U./dL
pH, UA: 5.5 (ref 5.0–8.0)

## 2021-04-05 MED ORDER — CEFTRIAXONE SODIUM 1 G IJ SOLR
1.0000 g | Freq: Once | INTRAMUSCULAR | Status: AC
  Administered 2021-04-05: 1 g via INTRAMUSCULAR

## 2021-04-05 MED ORDER — DOXYCYCLINE HYCLATE 100 MG PO TABS: 100.0000 mg | ORAL_TABLET | Freq: Two times a day (BID) | ORAL | 0 refills | Status: AC

## 2021-04-05 NOTE — Patient Instructions (Signed)
You were seen today in the [facility] for genitourinary complaints. Labs were collected, results will be available via MyChart or, if abnormal, you will be contacted by clinic staff. You were prescribed medications, please take as directed. Please follow up in 1-2 wks for reevaluation of symptoms as needed. Referral completed to urology, they will call you with an appointment. ?

## 2021-04-05 NOTE — Progress Notes (Signed)
? ?[facility] ?[place], [place]  [address] ?Phone:  [phone]   Fax:  [phone] ?Subjective:  ? Patient ID: Caleb Landry, male    DOB: 01-14-1970, 51 y.o.   MRN: [id number] ? ?No chief complaint on file. ? ?HPI ?Caleb Landry 51 y.o. male  has a past medical history of Asthma and Neuropathy. To the [facility] for possible groin infection. ? ?Denies any pain, itching or fever. Has had two partners in the past 6 mths. Last intercourse 1-2 wks ago, homosexual preference. States, " It just looks weird." Suspects it related to shaving and/ frequent masturbation. Denies any penile discharge. Denies any dysuria or problems with erection. States that he has attempted to use peroxide and antibiotic ointment with no improvement in symptoms.  ? ?Denies any other concerns today. Denies any fatigue, chest pain, shortness of breath, HA or dizziness. Denies any blurred vision, numbness or tingling. ? ?Past Medical History:  ?Diagnosis Date  ? Asthma   ? Neuropathy   ? ? ?Past Surgical History:  ?Procedure Laterality Date  ? APPENDECTOMY    ? HERNIA REPAIR    ? RIGHT/LEFT HEART CATH AND CORONARY ANGIOGRAPHY N/A 07/19/2020  ? Procedure: RIGHT/LEFT HEART CATH AND CORONARY ANGIOGRAPHY;  Surgeon: Jolaine Artist, MD;  Location: [facility];  Service: Cardiovascular;  Laterality: N/A;  ? ? ?Family History  ?Problem Relation Age of Onset  ? Crohn's disease Mother   ? ? ?Social History  ? ?Socioeconomic History  ? Marital status: Widowed  ?  Spouse name: Not on file  ? Number of children: Not on file  ? Years of education: Not on file  ? Highest education level: Not on file  ?Occupational History  ? Not on file  ?Tobacco Use  ? Smoking status: Former  ?  Packs/day: 0.50  ?  Years: 30.00  ?  Pack years: 15.00  ?  Types: Cigarettes  ?  Quit date: 08/13/2020  ?  Years since quitting: 0.6  ? Smokeless tobacco: Never  ?Vaping Use  ? Vaping Use: Never used  ?Substance and Sexual Activity  ? Alcohol  use: Not Currently  ? Drug use: Not Currently  ? Sexual activity: Not Currently  ?Other Topics Concern  ? Not on file  ?Social History Narrative  ? Not on file  ? ?Social Determinants of Health  ? ?Financial Resource Strain: High Risk  ? Difficulty of Paying Living Expenses: Hard  ?Food Insecurity: Food Insecurity Present  ? Worried About Charity fundraiser in the Last Year: Sometimes true  ? Ran Out of Food in the Last Year: Sometimes true  ?Transportation Needs: Unmet Transportation Needs  ? Lack of Transportation (Medical): Yes  ? Lack of Transportation (Non-Medical): Yes  ?Physical Activity: Not on file  ?Stress: Not on file  ?Social Connections: Not on file  ?Intimate Partner Violence: Not on file  ? ? ?Outpatient Medications Prior to Visit  ?Medication Sig Dispense Refill  ? albuterol (PROVENTIL) (2.5 MG/3ML) 0.083% nebulizer solution Take 3 mLs (2.5 mg total) by nebulization every 6 (six) hours as needed for wheezing or shortness of breath. 150 mL 1  ? albuterol (VENTOLIN HFA) 108 (90 Base) MCG/ACT inhaler Inhale 2 puffs into the lungs every 6 (six) hours as needed for wheezing or shortness of breath. 8.5 g 2  ? bisoprolol (ZEBETA) 5 MG tablet Take 0.5 tablets (2.5 mg total) by mouth daily. 34 tablet 2  ? empagliflozin (JARDIANCE) 10  MG TABS tablet Take 1 tablet (10 mg total) by mouth daily. 30 tablet 1  ? famotidine (PEPCID) 40 MG tablet Take 0.5 tablets (20 mg total) by mouth at bedtime for 5 days. 3 tablet 0  ? furosemide (LASIX) 40 MG tablet Take 1 tablet (40 mg total) by mouth daily as needed. 30 tablet 6  ? gabapentin (NEURONTIN) 300 MG capsule Take 1 capsule (300 mg total) by mouth 2 (two) times daily. 60 capsule 5  ? HYDROcodone-acetaminophen (NORCO/VICODIN) 5-325 MG tablet Take 1-2 tablets by mouth every 6 (six) hours as needed. 25 tablet 0  ? nystatin-triamcinolone ointment (MYCOLOG) Apply 1 application topically 2 (two) times daily. Apply to toenails 30 g 0  ? oxyCODONE-acetaminophen  (PERCOCET/ROXICET) 5-325 MG tablet Take 1-2 tablets by mouth every 8 (eight) hours as needed for severe pain. 15 tablet 0  ? potassium chloride SA (KLOR-CON M) 20 MEQ tablet Take 1 tablet (20 mEq total) by mouth daily as needed. Take when you take your lasix 30 tablet 2  ? sacubitril-valsartan (ENTRESTO) 49-51 MG Take 1 tablet by mouth 2 (two) times daily. 180 tablet 3  ? spironolactone (ALDACTONE) 25 MG tablet Take 1 tablet (25 mg total) by mouth daily. 30 tablet 6  ? triamcinolone (KENALOG) 0.025 % ointment Apply 1 application topically 2 (two) times daily. Apply to skin rash 30 g 0  ? clindamycin (CLEOCIN) 300 MG capsule Take 1 capsule (300 mg total) by mouth 3 (three) times daily. 9 capsule 0  ? ?No facility-administered medications prior to visit.  ? ? ?Allergies  ?Allergen Reactions  ? Penicillins Hives  ? ? ?Review of Systems  ?Constitutional:  Negative for chills, fever and malaise/fatigue.  ?Respiratory:  Negative for cough and shortness of breath.   ?Cardiovascular:  Negative for chest pain, palpitations and leg swelling.  ?Gastrointestinal:  Negative for abdominal pain, blood in stool, constipation, diarrhea, nausea and vomiting.  ?Genitourinary:  Negative for dysuria, flank pain, frequency, hematuria and urgency.  ?     See HPI  ?Skin: Negative.   ?Psychiatric/Behavioral:  Negative for depression. The patient is not nervous/anxious.   ?All other systems reviewed and are negative. ? ?   ?Objective:  ?  ?Physical Exam ?Vitals reviewed.  ?Constitutional:   ?   General: He is not in acute distress. ?   Appearance: Normal appearance. He is normal weight.  ?HENT:  ?   Head: Normocephalic.  ?Cardiovascular:  ?   Rate and Rhythm: Normal rate and regular rhythm.  ?   Pulses: Normal pulses.  ?   Heart sounds: Normal heart sounds.  ?   Comments: No obvious peripheral edema ?Pulmonary:  ?   Effort: Pulmonary effort is normal.  ?   Breath sounds: Normal breath sounds.  ?Genitourinary: ?   Penis: Erythema, swelling  and lesions present. No phimosis, paraphimosis, hypospadias, tenderness or discharge.   ?   Testes: Normal.  ?   Comments: Lesion to right lateral penis consistent with abscess with cellulitis. 0.5 cm fluctuant mass to left lateral penis consistent with abscess formation ?Skin: ?   General: Skin is warm and dry.  ?   Capillary Refill: Capillary refill takes less than 2 seconds.  ?Neurological:  ?   Mental Status: He is alert.  ?Psychiatric:     ?   Mood and Affect: Mood normal.     ?   Behavior: Behavior normal.     ?   Thought Content: Thought content normal.     ?  Judgment: Judgment normal.  ? ? ?There were no vitals taken for this visit. ?Wt Readings from Last 3 Encounters:  ?01/31/21 184 lb 9.6 oz (83.7 kg)  ?01/24/21 183 lb 12.8 oz (83.4 kg)  ?09/15/20 172 lb 6.4 oz (78.2 kg)  ? ? ? ?There is no immunization history on file for this patient. ? ?Diabetic Foot Exam - Simple   ?No data filed ?  ? ? ?Lab Results  ?Component Value Date  ? TSH 1.630 07/17/2020  ? ?Lab Results  ?Component Value Date  ? WBC 6.7 02/21/2021  ? HGB 15.7 02/21/2021  ? HCT 46.2 02/21/2021  ? MCV 92.4 02/21/2021  ? PLT 356 02/21/2021  ? ?Lab Results  ?Component Value Date  ? NA 139 02/21/2021  ? K 4.9 02/21/2021  ? CO2 33 (H) 02/21/2021  ? GLUCOSE 73 02/21/2021  ? BUN 14 02/21/2021  ? CREATININE 1.01 02/21/2021  ? BILITOT <0.2 09/13/2020  ? ALKPHOS 85 09/13/2020  ? AST 12 09/13/2020  ? ALT 12 09/13/2020  ? PROT 7.0 09/13/2020  ? ALBUMIN 4.5 09/13/2020  ? CALCIUM 10.3 02/21/2021  ? ANIONGAP 6 02/14/2021  ? EGFR 84 09/13/2020  ? ?Lab Results  ?Component Value Date  ? CHOL 162 09/13/2020  ? ?Lab Results  ?Component Value Date  ? HDL 54 09/13/2020  ? ?Lab Results  ?Component Value Date  ? [place] 92 09/13/2020  ? ?Lab Results  ?Component Value Date  ? TRIG 87 09/13/2020  ? ?Lab Results  ?Component Value Date  ? CHOLHDL 3.0 09/13/2020  ? ?Lab Results  ?Component Value Date  ? HGBA1C 5.5 02/21/2021  ? HGBA1C 5.3 09/13/2020  ? ? ?   ?Assessment &  Plan:  ? ?Problem List Items Addressed This Visit   ?None ?Visit Diagnoses   ? ? Penile abscess    -  Primary  ? Relevant Medications  ? doxycycline (VIBRA-TABS) 100 MG tablet  ? cefTRIAXone (ROCEPHIN) injection 1

## 2021-04-05 NOTE — Addendum Note (Signed)
Addended by: Dennison Mascot on: 04/05/2021 02:12 PM ? ? Modules accepted: Orders ? ?

## 2021-04-07 ENCOUNTER — Encounter: Payer: 59 | Admitting: Podiatry

## 2021-04-07 LAB — RPR+HIV+GC+CT PANEL
Chlamydia trachomatis, NAA: NEGATIVE
HIV Screen 4th Generation wRfx: NONREACTIVE
Neisseria Gonorrhoeae by PCR: NEGATIVE
RPR Ser Ql: NONREACTIVE

## 2021-04-19 ENCOUNTER — Ambulatory Visit (INDEPENDENT_AMBULATORY_CARE_PROVIDER_SITE_OTHER): Payer: 59

## 2021-04-19 ENCOUNTER — Ambulatory Visit (INDEPENDENT_AMBULATORY_CARE_PROVIDER_SITE_OTHER): Payer: 59 | Admitting: Podiatry

## 2021-04-19 DIAGNOSIS — Z9889 Other specified postprocedural states: Secondary | ICD-10-CM | POA: Diagnosis not present

## 2021-04-19 DIAGNOSIS — M2041 Other hammer toe(s) (acquired), right foot: Secondary | ICD-10-CM

## 2021-04-22 ENCOUNTER — Encounter (HOSPITAL_COMMUNITY): Payer: Medicaid Other | Admitting: Internal Medicine

## 2021-04-25 NOTE — Progress Notes (Signed)
Subjective: ?Caleb Landry is a 51 y.o. is seen today in office s/p hammertoe repair of digits 1 through 5 on the right foot preformed on 03/09/2021.  States he has been doing well and presents today to have the K wires removed.  Still in the cam boot.  Been icing elevating.  Asked when he can have surgery in the left foot.  No fevers or chills that he reports.  ? ?Objective: ?General: No acute distress, AAOx3  ?DP/PT pulses palpable 2/4, CRT < 3 sec to all digits.  ?Protective sensation intact. Motor function intact.  ?Right foot: Incision is well coapted without any evidence of dehiscence with sutures intact.  K wires intact without any drainage or pus.  No significant pain.  Keep toes are in rectus position.  Minimal edema.  No erythema or warmth.  No signs of infection. ?No other open lesions or pre-ulcerative lesions.  ?No pain with calf compression, swelling, warmth, erythema.  ? ?Assessment and Plan:  ?Status post right foot hammertoe repair, doing well with no complications  ? ?-Treatment options discussed including all alternatives, risks, and complications ?-X-rays obtained reviewed.  3 views of the right foot were obtained.  Status post K wire removal.  Status post hammertoe repair.  Screw intact to the hallux. ?-I remove the K wire still any complications.  Toes remain in a rectus position.  They were cleaned with saline.  Antibiotic ointment and a bandage applied.  Discussed he can start to wash the area tomorrow and apply a similar bandage.  Continue with the offloading boot for now but discussed gradual transition to regular shoe as tolerated.  Continue ice, elevate as well as compression Of the postoperative edema. ?-Asked about surgery his left foot.)  To be an issue on his right foot before proceeding with his left foot. ?-Monitor for any clinical signs or symptoms of infection and directed to call the office immediately should any occur or go to the ER. ? ?Trula Slade DPM ? ?

## 2021-04-26 ENCOUNTER — Ambulatory Visit (HOSPITAL_COMMUNITY)
Admission: RE | Admit: 2021-04-26 | Discharge: 2021-04-26 | Disposition: A | Discharge status: Home / Self Care | Payer: 59 | Source: Ambulatory Visit | Attending: Internal Medicine | Admitting: Internal Medicine

## 2021-04-26 VITALS — BP 124/72 | HR 74 | Wt 179.5 lb

## 2021-04-26 DIAGNOSIS — I5032 Chronic diastolic (congestive) heart failure: Secondary | ICD-10-CM | POA: Diagnosis not present

## 2021-04-26 DIAGNOSIS — Z720 Tobacco use: Secondary | ICD-10-CM

## 2021-04-26 DIAGNOSIS — Z79899 Other long term (current) drug therapy: Secondary | ICD-10-CM | POA: Insufficient documentation

## 2021-04-26 DIAGNOSIS — I5022 Chronic systolic (congestive) heart failure: Secondary | ICD-10-CM | POA: Diagnosis present

## 2021-04-26 DIAGNOSIS — J45909 Unspecified asthma, uncomplicated: Secondary | ICD-10-CM | POA: Insufficient documentation

## 2021-04-26 DIAGNOSIS — Z7984 Long term (current) use of oral hypoglycemic drugs: Secondary | ICD-10-CM | POA: Insufficient documentation

## 2021-04-26 DIAGNOSIS — F17210 Nicotine dependence, cigarettes, uncomplicated: Secondary | ICD-10-CM | POA: Diagnosis not present

## 2021-04-26 LAB — BASIC METABOLIC PANEL
Anion gap: 6 (ref 5–15)
BUN: 11 mg/dL (ref 6–20)
CO2: 31 mmol/L (ref 22–32)
Calcium: 9.5 mg/dL (ref 8.9–10.3)
Chloride: 100 mmol/L (ref 98–111)
Creatinine, Ser: 0.98 mg/dL (ref 0.61–1.24)
GFR, Estimated: >60 mL/min (ref >60)
Glucose, Bld: 84 mg/dL (ref 70–99)
Potassium: 5 mmol/L (ref 3.5–5.1)
Sodium: 137 mmol/L (ref 135–145)

## 2021-04-26 LAB — BRAIN NATRIURETIC PEPTIDE: B Natriuretic Peptide: 27.6 pg/mL (ref 0.0–100.0)

## 2021-04-26 MED ORDER — ENTRESTO 97-103 MG PO TABS: 1.0000 | ORAL_TABLET | Freq: Two times a day (BID) | ORAL | 3 refills | Status: DC

## 2021-04-26 NOTE — Patient Instructions (Signed)
Medication Changes: ? ?INCREASE entresto 97/103mg  BID. This will be mailed to you.  ? ?Lab Work: ? ?Labs done today, your results will be available in MyChart, we will contact you for abnormal readings. ? ?Your physician recommends that you return for lab work in: 1-2 weeks. You are scheduled for Thursday, May 4 @ 2:30pm.  ? ? ?Testing/Procedures: ? ?none ? ?Referrals: ? ?none ? ?Special Instructions // Education: ? ?Do the following things EVERYDAY: ?Weigh yourself in the morning before breakfast. Write it down and keep it in a log. ?Take your medicines as prescribed ?Eat low salt foods--Limit salt (sodium) to 2000 mg per day.  ?Stay as active as you can everyday ?Limit all fluids for the day to less than 2 liters ? ?Follow-Up in: 6 months.  ? ?PLEASE CALL us IN AUGUST TO SCHEDULE THIS APPOINTMENT.  ? ?At the [facility], you and your health needs are our priority. We have a designated team specialized in the treatment of Heart Failure. This Care Team includes your primary Heart Failure Specialized Cardiologist (physician), Advanced Practice Providers (APPs- Physician Assistants and Nurse Practitioners), and Pharmacist who all work together to provide you with the care you need, when you need it.  ? ?You may see any of the following providers on your designated Care Team at your next follow up: ? ?Dr Arvilla Meres ?Dr Marca Ancona ?Tonye Becket, NP ?Robbie Lis, PA ?Jessica Milford,NP ?Anna Genre, PA ?Karle Plumber, PharmD ? ? ?Please be sure to bring in all your medications bottles to every appointment.  ? ?Need to Contact us: ? ?If you have any questions or concerns before your next appointment please send [place] a message through [place] or call our office at [phone].   ? ?TO LEAVE A MESSAGE FOR THE NURSE SELECT OPTION 2, PLEASE LEAVE A MESSAGE INCLUDING: ?YOUR NAME ?DATE OF BIRTH ?CALL BACK NUMBER ?REASON FOR CALL**this is important as we prioritize the call backs ? ?YOU WILL RECEIVE  A CALL BACK THE SAME DAY AS LONG AS YOU CALL BEFORE 4:00 PM ? ? ?

## 2021-04-26 NOTE — Progress Notes (Signed)
?Advanced Heart Failure Clinic Note  ? ? ?PCP: Kathrynn Speed, NP ?HF Cardiologist: Dr. Gala Romney ? ?HPI: ?Caleb Landry is a 51 y.o. male with history of asthma, tobacco abuse, and chronic systolic heart failure. ? ?Admitted 7/22 for acute CHF exacerbation and started on lasix 40 mg IV bid. Echo obtained showing LVEF ~20% with global HK, G3DD, prominent LV. Cardiology and AHF team consulted to assist with management. Ultimately down 30 lbs after large volume diuresis. R/LHC: normal cors, severe NICM EF 20-25%, low filling pressures with moderately reduced CO. He was started on GDMT. SPEP negative. cMRI: LVEF 13%, RVEF 21%, nonspecific RV insertion LGE, mild MR, mild TR. Suspect NICM possibly due to OTC stimulants (he does not use meth). Hospitalization complicated by lower extremity swelling w/ concern for cellulitis; treated with doxycycline. His discharge weight 165 was lbs.  ? ?Last seen for f/u 01/23. Volume looked good. Entesto increased. Furosemide switched to PRN. ? ?Echo 01/23: EF 40-45%, RV okay ? ?Here today for 3 month follow-up. Has been doing well. Had hammertoe repair on right back in March. Walking in boot. Notes some dyspnea with exertion which is chronic. Symptoms have not progressed. He mows the lawn with a push mower and cleans his home. Patient sometimes has to stop and catch his breath but primarily attributes this to asthma. No orthopnea, PND or leg edema. Home weight has bene between 180-185 lb. 179 lb in the clinic today. Taking meds as prescribed.  ?  ? ?CARDIAC STUDIES: ? ?- Echo 01/23: EF 40-45%, RV okay ? ?- cMRI (7/22): LVEF 13%, RV severe systolic dysfunction 21%, mild MR/TR, RV insertion site LGE ? ?- Echo (07/16/20): EF < 20, moderately dilated LV, mild LVH, enlarged RV with moderately reduced RV function, RVSP 54 mmHg, severe LAE, mild to moderate MR/TR ? ?- R/LHC (07/19/20): normal cors, severe NICM EF 20-25%, low filling pressures w/ mod reduced CO ?  ?Ao = 96/70 (81) ?LV =  90/8 ?RA =  1 ?RV = 29/2 ?PA = 29/8 (15) ?PCW = 5 ?Fick cardiac output/index = 4.1/2.1 ?PVR = 2.5 ?SVR = 1572 ?Ao sat = 96% ?PA sat = 62%, 63% ? ?Past Medical History:  ?Diagnosis Date  ? Asthma   ? Neuropathy   ? ?Current Outpatient Medications  ?Medication Sig Dispense Refill  ? albuterol (PROVENTIL) (2.5 MG/3ML) 0.083% nebulizer solution Take 3 mLs (2.5 mg total) by nebulization every 6 (six) hours as needed for wheezing or shortness of breath. 150 mL 1  ? albuterol (VENTOLIN HFA) 108 (90 Base) MCG/ACT inhaler Inhale 2 puffs into the lungs every 6 (six) hours as needed for wheezing or shortness of breath. 8.5 g 2  ? bisoprolol (ZEBETA) 5 MG tablet Take 0.5 tablets (2.5 mg total) by mouth daily. 34 tablet 2  ? empagliflozin (JARDIANCE) 10 MG TABS tablet Take 1 tablet (10 mg total) by mouth daily. 30 tablet 1  ? famotidine (PEPCID) 40 MG tablet Take 0.5 tablets (20 mg total) by mouth at bedtime for 5 days. 3 tablet 0  ? furosemide (LASIX) 40 MG tablet Take 1 tablet (40 mg total) by mouth daily as needed. 30 tablet 6  ? gabapentin (NEURONTIN) 300 MG capsule Take 1 capsule (300 mg total) by mouth 2 (two) times daily. 60 capsule 5  ? HYDROcodone-acetaminophen (NORCO/VICODIN) 5-325 MG tablet Take 1-2 tablets by mouth every 6 (six) hours as needed. 25 tablet 0  ? nystatin-triamcinolone ointment (MYCOLOG) Apply 1 application topically 2 (two) times daily. Apply  to toenails 30 g 0  ? oxyCODONE-acetaminophen (PERCOCET/ROXICET) 5-325 MG tablet Take 1-2 tablets by mouth every 8 (eight) hours as needed for severe pain. 15 tablet 0  ? potassium chloride SA (KLOR-CON M) 20 MEQ tablet Take 1 tablet (20 mEq total) by mouth daily as needed. Take when you take your lasix 30 tablet 2  ? sacubitril-valsartan (ENTRESTO) 49-51 MG Take 1 tablet by mouth 2 (two) times daily. 180 tablet 3  ? spironolactone (ALDACTONE) 25 MG tablet Take 1 tablet (25 mg total) by mouth daily. 30 tablet 6  ? triamcinolone (KENALOG) 0.025 % ointment Apply 1  application topically 2 (two) times daily. Apply to skin rash 30 g 0  ? ?No current facility-administered medications for this encounter.  ? ?Allergies  ?Allergen Reactions  ? Penicillins Hives  ? ?Social History  ? ?Socioeconomic History  ? Marital status: Widowed  ?  Spouse name: Not on file  ? Number of children: Not on file  ? Years of education: Not on file  ? Highest education level: Not on file  ?Occupational History  ? Not on file  ?Tobacco Use  ? Smoking status: Former  ?  Packs/day: 0.50  ?  Years: 30.00  ?  Pack years: 15.00  ?  Types: Cigarettes  ?  Quit date: 08/13/2020  ?  Years since quitting: 0.7  ? Smokeless tobacco: Never  ?Vaping Use  ? Vaping Use: Never used  ?Substance and Sexual Activity  ? Alcohol use: Not Currently  ? Drug use: Not Currently  ? Sexual activity: Not Currently  ?Other Topics Concern  ? Not on file  ?Social History Narrative  ? Not on file  ? ?Social Determinants of Health  ? ?Financial Resource Strain: High Risk  ? Difficulty of Paying Living Expenses: Hard  ?Food Insecurity: Food Insecurity Present  ? Worried About Programme researcher, broadcasting/film/video in the Last Year: Sometimes true  ? Ran Out of Food in the Last Year: Sometimes true  ?Transportation Needs: Unmet Transportation Needs  ? Lack of Transportation (Medical): Yes  ? Lack of Transportation (Non-Medical): Yes  ?Physical Activity: Not on file  ?Stress: Not on file  ?Social Connections: Not on file  ?Intimate Partner Violence: Not on file  ? ?Family History  ?Problem Relation Age of Onset  ? Crohn's disease Mother   ? ?BP 124/72   Pulse 74   Wt 81.4 kg (179 lb 8 oz)   SpO2 97%   BMI 25.76 kg/m?  ? ?Wt Readings from Last 3 Encounters:  ?04/26/21 81.4 kg (179 lb 8 oz)  ?04/05/21 81.2 kg (179 lb 0.6 oz)  ?01/31/21 83.7 kg (184 lb 9.6 oz)  ? ?PHYSICAL EXAM: ?General:  Well appearing. No resp difficulty ?HEENT: normal ?Neck: supple. no JVD. Carotids 2+ bilat; no bruits.  ?Cor: PMI nondisplaced. Regular rate & rhythm. No rubs, gallops or  murmurs. ?Lungs: scattered expiratory wheezes ?Abdomen: soft, nontender, nondistended. No hepatosplenomegaly. No bruits or masses. Good bowel sounds. ?Extremities: no cyanosis, clubbing, rash, edema, + varicose veins b/l LE. Boot on right foot. ?Neuro: alert & orientedx3, cranial nerves grossly intact. moves all 4 extremities w/o difficulty. Affect pleasant ? ? ?ECG: SR 76 bpm, LVH ? ?ReDs: Unable to get  ? ?ASSESSMENT & PLAN: ? ?1. Chronic Systolic HF ?- Echo (07/16/20): EF < 20% RV moderately down mild to mod MR/TR.  ?- Cath with normal coronaries, low filling pressures and reduced cardiac output. Cardiac MRI pending. ?- Suspect NICM possibly due to  OTC stimulants (he does not use meth). There is some hyper trabeculation in the LV cavity but not felt to be consistent with LV noncompaction.  ?- SPEP negative. ?- cMRI (7/22): EF 13%, RVEF 21%, mild MR/TR, nonspecific RV insertion LGE ?- Echo 01/23: EF 40-45% ?- NYHA II, volume looks good on exam. ReDS Unable to measure ?- Has lasix 40 mg + 20 KCL to use PRN ?- Increase to Entresto to 97/103 mg bid. Entresto thru [employer]. ?- Continue bisoprolol 2.5 mg daily (h/o asthma). ?- Continue spiro 25 mg daily. ?- Continue Jardiance 10 mg daily. Jardiance through [facility]. ?- Off digoxin with improvement in EF ?- Continue to weigh daily and log. ?- BMET and BNP today ? ?2. Tobacco use ?- Previously quit, currently smoking 6-7 cigarettes daily ?- Encouraged cessation.  ? ? ?Follow up: 6 months ? ?FINCH, LINDSAY N, PA-C ?04/26/21  ? ?Patient seen and examined with the above-signed Advanced Practice Provider and/or Housestaff. I personally reviewed laboratory data, imaging studies and relevant notes. I independently examined the patient and formulated the important aspects of the plan. I have edited the note to reflect any of my changes or salient points. I have personally discussed the plan with the patient and/or family. ? ?He is doing well. NYHA II. Volume status looks good.  Continues to smoke. Most recent echo EF 40-45% Complaint with meds ? ?General:  Well appearing. No resp difficulty ?HEENT: normal ?Neck: supple. no JVD. Carotids 2+ bilat; no bruits. No lymphadenopathy or thry

## 2021-04-26 NOTE — Addendum Note (Signed)
Encounter addended by: Stoney Bang, RN on: 04/26/2021 3:11 PM ? Actions taken: Pharmacy for encounter modified, Visit diagnoses modified, Order list changed, Diagnosis association updated, Pend clinical note, Clinical Note Signed, Charge Capture section accepted

## 2021-04-28 ENCOUNTER — Telehealth: Payer: Self-pay | Admitting: Licensed Clinical Social Worker

## 2021-04-28 NOTE — Telephone Encounter (Signed)
Referral received for transportation and disability support.  ?LCSW attempted to reach pt via telephone today at [phone]. No answer, voicemail left requesting return call.  ? ?Octavio Graves, MSW, LCSW ?Clinical Social Worker II ?[place] [facility] Navigation  ?[phone]- work cell phone (preferred) ?[phone]- desk phone ? ?

## 2021-04-29 ENCOUNTER — Telehealth: Payer: Self-pay | Admitting: Licensed Clinical Social Worker

## 2021-04-29 NOTE — Progress Notes (Signed)
?Heart and Vascular Care Navigation ? ?04/29/2021 ? ?Caleb Landry ?1970/05/29 ?[id number] ? ?Reason for Referral:  ?Transportation/Disability ?Engaged with patient by telephone for initial visit for Heart and Vascular Care Coordination. ?                                                                                                  ?Assessment:                   ?Pt was able to be reached this afternoon at [phone]. Introduced self, role, reason for call. Pt confirmed home address, PCP, and current [employer] coverage. Pt shares that he lives with a friend, Casimiro Needle, no issues with obtaining or affording medications at this time. Pt denies issues with obtaining or affording food at this time or any costs of living. He has a pending disability application open with [employer]. He intermittantly has challenges with getting to a from appointments. I inquired if he would be able to reach his appt next week for labs. Pt confirms he will- was advised to contact the clinic if needed in future for appointments and we can try and connect him with transportation options such as bus passes. Pt agreeable, appreciative of call.                    ? ?HRT/VAS Care Coordination   ? ? Patients Home Cardiology Office [facility]  ? Outpatient Care Team Social Worker  ? Social Worker Name: Esmeralda [facility] [phone]  ? Living arrangements for the past 2 months Single Family Home  ? Lives with: Friends  ? Patient Current Optometrist  ? Patient Has Concern With Paying Medical Bills No  ? Does Patient Have Prescription Coverage? Yes  ? Home Assistive Devices/Equipment None  ? ?  ? ? ?Social History:                                                                             ?SDOH Screenings  ? ?Alcohol Screen: Not on file  ?Depression (PHQ2-9): Low Risk   ? PHQ-2 Score: 0  ?Financial Resource Strain: Medium Risk  ? Difficulty of Paying Living Expenses:  Somewhat hard  ?Food Insecurity: No Food Insecurity  ? Worried About Programme researcher, broadcasting/film/video in the Last Year: Never true  ? Ran Out of Food in the Last Year: Never true  ?Housing: Low Risk   ? Last Housing Risk Score: 0  ?Physical Activity: Not on file  ?Social Connections: Not on file  ?Stress: Not on file  ?Tobacco Use: Medium Risk  ? Smoking Tobacco Use: Former  ? Smokeless Tobacco Use: Never  ? Passive Exposure: Not on file  ?Transportation Needs: Unmet Transportation Needs  ? Lack of Transportation (Medical): Yes  ?  Lack of Transportation (Non-Medical): Yes  ? ? ?SDOH Interventions: ?Financial Resources:  Financial Strain Interventions: Other (Comment) (pt working on disability) ?Financial Counseling for [employer] Program  ?Food Insecurity:  Food Insecurity Interventions: Intervention Not Indicated  ?Housing Insecurity:   Housing Insecurity Interventions: Intervention Not Indicated  ?Transportation:   Transportation Interventions: Patient Resources Dietitian) (discussed may be eligible for bus passes in future)  ? ? ?Follow-up plan:   ?LCSW reviewed pt accounting, noted past medical bills before pt enrolled w/ [employer], per financial counselor Elijio Miles., pt should be able to apply for [facility] I will mail him that and my card. Remain available.  ? ? ? ?

## 2021-05-05 ENCOUNTER — Ambulatory Visit (HOSPITAL_COMMUNITY)
Admission: RE | Admit: 2021-05-05 | Discharge: 2021-05-05 | Disposition: A | Discharge status: Home / Self Care | Payer: 59 | Source: Ambulatory Visit | Attending: Cardiology | Admitting: Cardiology

## 2021-05-05 DIAGNOSIS — I5022 Chronic systolic (congestive) heart failure: Secondary | ICD-10-CM | POA: Diagnosis present

## 2021-05-05 LAB — BASIC METABOLIC PANEL
Anion gap: 7 (ref 5–15)
BUN: 43 mg/dL — ABNORMAL HIGH (ref 6–20)
CO2: 30 mmol/L (ref 22–32)
Calcium: 8.5 mg/dL — ABNORMAL LOW (ref 8.9–10.3)
Chloride: 102 mmol/L (ref 98–111)
Creatinine, Ser: 1.52 mg/dL — ABNORMAL HIGH (ref 0.61–1.24)
GFR, Estimated: 56 mL/min — ABNORMAL LOW (ref >60)
Glucose, Bld: 88 mg/dL (ref 70–99)
Potassium: 5 mmol/L (ref 3.5–5.1)
Sodium: 139 mmol/L (ref 135–145)

## 2021-05-06 ENCOUNTER — Telehealth: Payer: Self-pay | Admitting: Licensed Clinical Social Worker

## 2021-05-06 NOTE — Telephone Encounter (Signed)
Pt called this Statistician sent to him. He is interested in completing this at this time. We went through how to complete application and what documents are pertinent at this time. Pt needs to get a copy of his ID w/ current address, a SNAP letter w/ current amount, letter of nonfiling from IRS and letter of support (notarized) from his friend. He thinks he should be able to get these completed. I have encouraged pt to call me with any questions, he has my phone and email.  ? ?Caleb Landry, MSW, LCSW ?Clinical Social Worker II ?[place] [facility] Navigation  ?[phone]- work cell phone (preferred) ?[phone]- desk phone ? ?

## 2021-05-09 ENCOUNTER — Other Ambulatory Visit (HOSPITAL_COMMUNITY): Payer: Self-pay | Admitting: Cardiology

## 2021-05-09 DIAGNOSIS — I5022 Chronic systolic (congestive) heart failure: Secondary | ICD-10-CM

## 2021-05-13 ENCOUNTER — Telehealth (HOSPITAL_COMMUNITY): Payer: Self-pay

## 2021-05-13 NOTE — Telephone Encounter (Signed)
Received a fax requesting medical records from [facility]. Records were successfully faxed to: [phone] ,which was the number provided.. Medical request form will be scanned into patients chart.  ? ?

## 2021-05-16 ENCOUNTER — Ambulatory Visit (INDEPENDENT_AMBULATORY_CARE_PROVIDER_SITE_OTHER): Payer: Commercial Managed Care - HMO

## 2021-05-16 ENCOUNTER — Ambulatory Visit (INDEPENDENT_AMBULATORY_CARE_PROVIDER_SITE_OTHER): Payer: 59 | Admitting: Podiatry

## 2021-05-16 DIAGNOSIS — Z9889 Other specified postprocedural states: Secondary | ICD-10-CM

## 2021-05-16 DIAGNOSIS — M2041 Other hammer toe(s) (acquired), right foot: Secondary | ICD-10-CM

## 2021-05-17 ENCOUNTER — Ambulatory Visit (HOSPITAL_COMMUNITY)
Admission: RE | Admit: 2021-05-17 | Discharge: 2021-05-17 | Disposition: A | Discharge status: Home / Self Care | Payer: 59 | Source: Ambulatory Visit | Attending: Cardiology | Admitting: Cardiology

## 2021-05-17 ENCOUNTER — Other Ambulatory Visit (HOSPITAL_COMMUNITY): Payer: Self-pay | Admitting: *Deleted

## 2021-05-17 ENCOUNTER — Telehealth (HOSPITAL_COMMUNITY): Payer: Self-pay | Admitting: Pharmacy Technician

## 2021-05-17 ENCOUNTER — Telehealth: Payer: Self-pay | Admitting: Licensed Clinical Social Worker

## 2021-05-17 DIAGNOSIS — I5022 Chronic systolic (congestive) heart failure: Secondary | ICD-10-CM | POA: Insufficient documentation

## 2021-05-17 LAB — BASIC METABOLIC PANEL
Anion gap: 7 (ref 5–15)
BUN: 11 mg/dL (ref 6–20)
CO2: 29 mmol/L (ref 22–32)
Calcium: 9.2 mg/dL (ref 8.9–10.3)
Chloride: 103 mmol/L (ref 98–111)
Creatinine, Ser: 0.91 mg/dL (ref 0.61–1.24)
GFR, Estimated: >60 mL/min (ref >60)
Glucose, Bld: 108 mg/dL — ABNORMAL HIGH (ref 70–99)
Potassium: 4.2 mmol/L (ref 3.5–5.1)
Sodium: 139 mmol/L (ref 135–145)

## 2021-05-17 MED ORDER — ENTRESTO 97-103 MG PO TABS
1.0000 | ORAL_TABLET | Freq: Two times a day (BID) | ORAL | 3 refills | Status: DC
Start: 2021-05-17 — End: 2021-10-13

## 2021-05-17 NOTE — Telephone Encounter (Signed)
Advanced Heart Failure Patient Advocate Encounter ? ?Patient came in to clinic today for a lab draw. Stated that his Sherryll Burger has not been sent, despite new 97-103mg  being sent in April. Provided a bottle of samples to get the patient through while investigating.  ? ?Called and spoke with [employer]. They did receive the RX. The patient would have to call in and request a refill before they will ship the medication. Called and relayed information to patient. He stated that he would call today. ? ?Archer Asa, CPhT ? ?

## 2021-05-17 NOTE — Telephone Encounter (Signed)
LCSW f/u with pt today regarding CAFA he has been working on. Still working on gathering documentation- no additional questions at this time. I provided him with AHF clinic SW number so that if he needs something over the next two weeks that he will have a contact that can answer those for him. Care Navigation team remains available.  ? ?Octavio Graves, MSW, LCSW ?Clinical Social Worker II ?[place] [facility] Navigation  ?[phone]- work cell phone (preferred) ?[phone]- desk phone ? ?

## 2021-05-18 NOTE — Progress Notes (Signed)
Subjective: ?Caleb Landry is a 51 y.o. is seen today in office s/p hammertoe repair of digits 1 through 5 on the right foot preformed on 03/09/2021.  States he has been doing better is wearing regular shoes.  The swelling to the toes is improved.  Still gets some tenderness over the toes but that seems to be improving as well.  He still wants to have the left foot corrected as well.  No recent injuries or changes.  No fevers or chills.  No other concerns.   ? ?Objective: ?General: No acute distress, AAOx3  ?DP/PT pulses palpable 2/4, CRT < 3 sec to all digits.  ?Protective sensation intact. Motor function intact.  ?Right foot: Incision is well coapted without any evidence of dehiscence and scars are well formed.  There is decreased edema present to the toes.  Mild swelling still present but appears to be improved.  No erythema or warmth.  ?No pain with calf compression, swelling, warmth, erythema.  ? ?Assessment and Plan:  ?Status post right foot hammertoe repair ? ?-Treatment options discussed including all alternatives, risks, and complications ?-X-rays obtained reviewed.  3 views of the right foot were obtained.  Status post K wire removal.  Status post hammertoe repair.  Screw intact to the hallux however radiolucency noted on the screw but still intact.  Scabbing present on the hallux IPJ. ?-We discussed x-ray findings.  We discussed possible removal of the screw if needed but for now he seems to be improving clinically already continue to monitor this.  Continue supportive shoes we discussed different stretching exercises to be performed at home. ?-We discussed surgery for the left foot however want the right foot to continue to improve before proceeding with the left foot.  He understands. ? ?Trula Slade DPM ?

## 2021-05-20 ENCOUNTER — Other Ambulatory Visit: Payer: Self-pay | Admitting: Nurse Practitioner

## 2021-05-20 DIAGNOSIS — J4520 Mild intermittent asthma, uncomplicated: Secondary | ICD-10-CM

## 2021-06-08 ENCOUNTER — Telehealth: Payer: Self-pay | Admitting: Licensed Clinical Social Worker

## 2021-06-08 NOTE — Telephone Encounter (Signed)
LCSW called pt this afternoon to provide general check in.  Pt shares that he is still working on application- has had delays getting some of the supporting documents but still plans on completing it. He has an upcoming appt with podiatry and understands if his neighbor cannot take him to contact [facility] and they should have access to some transportation passes to get him to appt. Encourage him to f/u with [place] if we can be of any additional assistance. No additional questions/concerns at this time.   Octavio Graves, MSW, LCSW Clinical Social Worker II [facility] Navigation  [phone]- work cell phone (preferred) [phone]- desk phone

## 2021-06-14 ENCOUNTER — Emergency Department (HOSPITAL_COMMUNITY): Payer: 59

## 2021-06-14 ENCOUNTER — Emergency Department (HOSPITAL_COMMUNITY)
Admission: EM | Admit: 2021-06-14 | Discharge: 2021-06-14 | Disposition: A | Discharge status: Home / Self Care | Payer: 59 | Attending: Emergency Medicine | Admitting: Emergency Medicine

## 2021-06-14 ENCOUNTER — Other Ambulatory Visit: Payer: Self-pay

## 2021-06-14 ENCOUNTER — Encounter (HOSPITAL_COMMUNITY): Payer: Self-pay | Admitting: Emergency Medicine

## 2021-06-14 DIAGNOSIS — N453 Epididymo-orchitis: Secondary | ICD-10-CM | POA: Diagnosis not present

## 2021-06-14 DIAGNOSIS — N451 Epididymitis: Secondary | ICD-10-CM

## 2021-06-14 DIAGNOSIS — S3994XA Unspecified injury of external genitals, initial encounter: Secondary | ICD-10-CM | POA: Diagnosis present

## 2021-06-14 DIAGNOSIS — W228XXA Striking against or struck by other objects, initial encounter: Secondary | ICD-10-CM | POA: Diagnosis not present

## 2021-06-14 DIAGNOSIS — Y93A6 Activity, grass drills: Secondary | ICD-10-CM | POA: Insufficient documentation

## 2021-06-14 LAB — COMPREHENSIVE METABOLIC PANEL
ALT: 16 U/L (ref 0–44)
AST: 17 U/L (ref 15–41)
Albumin: 3.4 g/dL — ABNORMAL LOW (ref 3.5–5.0)
Alkaline Phosphatase: 71 U/L (ref 38–126)
Anion gap: 11 (ref 5–15)
BUN: 15 mg/dL (ref 6–20)
CO2: 24 mmol/L (ref 22–32)
Calcium: 8.9 mg/dL (ref 8.9–10.3)
Chloride: 97 mmol/L — ABNORMAL LOW (ref 98–111)
Creatinine, Ser: 1.2 mg/dL (ref 0.61–1.24)
GFR, Estimated: >60 mL/min (ref >60)
Glucose, Bld: 106 mg/dL — ABNORMAL HIGH (ref 70–99)
Potassium: 3.8 mmol/L (ref 3.5–5.1)
Sodium: 132 mmol/L — ABNORMAL LOW (ref 135–145)
Total Bilirubin: 0.8 mg/dL (ref 0.3–1.2)
Total Protein: 6.5 g/dL (ref 6.5–8.1)

## 2021-06-14 LAB — URINALYSIS, ROUTINE W REFLEX MICROSCOPIC
Bilirubin Urine: NEGATIVE
Glucose, UA: >=500 mg/dL — AB
Ketones, ur: 5 mg/dL — AB
Leukocytes,Ua: NEGATIVE
Nitrite: POSITIVE — AB
Protein, ur: NEGATIVE mg/dL
Specific Gravity, Urine: 1.021 (ref 1.005–1.030)
pH: 5 (ref 5.0–8.0)

## 2021-06-14 LAB — CBC WITH DIFFERENTIAL/PLATELET
Abs Immature Granulocytes: 0.05 10*3/uL (ref 0.00–0.07)
Basophils Absolute: 0 10*3/uL (ref 0.0–0.1)
Basophils Relative: 0 %
Eosinophils Absolute: 0 10*3/uL (ref 0.0–0.5)
Eosinophils Relative: 0 %
HCT: 35.6 % — ABNORMAL LOW (ref 39.0–52.0)
Hemoglobin: 12 g/dL — ABNORMAL LOW (ref 13.0–17.0)
Immature Granulocytes: 0 %
Lymphocytes Relative: 9 %
Lymphs Abs: 1.3 10*3/uL (ref 0.7–4.0)
MCH: 30.2 pg (ref 26.0–34.0)
MCHC: 33.7 g/dL (ref 30.0–36.0)
MCV: 89.7 fL (ref 80.0–100.0)
Monocytes Absolute: 1.5 10*3/uL — ABNORMAL HIGH (ref 0.1–1.0)
Monocytes Relative: 11 %
Neutro Abs: 10.9 10*3/uL — ABNORMAL HIGH (ref 1.7–7.7)
Neutrophils Relative %: 80 %
Platelets: 229 10*3/uL (ref 150–400)
RBC: 3.97 MIL/uL — ABNORMAL LOW (ref 4.22–5.81)
RDW: 13.7 % (ref 11.5–15.5)
WBC: 13.8 10*3/uL — ABNORMAL HIGH (ref 4.0–10.5)
nRBC: 0 % (ref 0.0–0.2)

## 2021-06-14 LAB — LACTIC ACID, PLASMA: Lactic Acid, Venous: 0.9 mmol/L (ref 0.5–1.9)

## 2021-06-14 MED ORDER — OXYCODONE-ACETAMINOPHEN 5-325 MG PO TABS: 1.0000 | ORAL_TABLET | Freq: Four times a day (QID) | ORAL | 0 refills | Status: AC | PRN

## 2021-06-14 MED ORDER — SODIUM CHLORIDE 0.9 % IV SOLN: 1.0000 g | Freq: Once | INTRAVENOUS | Status: DC

## 2021-06-14 MED ORDER — DOXYCYCLINE HYCLATE 100 MG PO TABS
100.0000 mg | ORAL_TABLET | Freq: Once | ORAL | Status: AC
  Administered 2021-06-14: 100 mg via ORAL
  Filled 2021-06-14: qty 1

## 2021-06-14 MED ORDER — DOXYCYCLINE HYCLATE 100 MG PO CAPS
100.0000 mg | ORAL_CAPSULE | Freq: Two times a day (BID) | ORAL | 0 refills | Status: AC
Start: 2021-06-14 — End: 2021-06-24

## 2021-06-14 MED ORDER — HYDROMORPHONE HCL 1 MG/ML IJ SOLN
1.0000 mg | Freq: Once | INTRAMUSCULAR | Status: AC
Start: 2021-06-14 — End: 2021-06-14
  Administered 2021-06-14: 1 mg via INTRAMUSCULAR
  Filled 2021-06-14: qty 1

## 2021-06-14 NOTE — ED Provider Triage Note (Signed)
Emergency Medicine Provider Triage Evaluation Note  Caleb Landry , a 51 y.o. male  was evaluated in triage.  Pt complains of trauma to the testicles x2.  Was hit with a lawnmower in his testicles yesterday and by chair today.  Endorsing severe pain to the testicles, mostly on left side.  No penile pain.  Review of Systems  Positive: Testicle pain Negative: Penile pain, difficulty urinating or dysuria  Physical Exam  BP 132/69 (BP Location: Right Arm)   Pulse (!) 115   Temp 98.6 F (37 C) (Oral)   Resp 20   SpO2 98%  Gen:   Awake, no distress   Resp:  Normal effort  MSK:   Moves extremities without difficulty  Other:  GU exam performed with nursing staff.  Patient has large and erythematous scrotum.  Most of pain is localized to left testicle.  No penile lesions or discharge  Medical Decision Making  Medically screening exam initiated at 11:27 AM.  Appropriate orders placed.  Caleb Landry was informed that the remainder of the evaluation will be completed by another provider, this initial triage assessment does not replace that evaluation, and the importance of remaining in the ED until their evaluation is complete.    torsion study ordered   Saddie Benders, PA-C 06/14/21 1128

## 2021-06-14 NOTE — Discharge Instructions (Signed)
Please take antibiotics starting tomorrow morning.  You should call [facility] tomorrow morning to schedule a follow-up appointment. For your pain you can use ibuprofen and Tylenol at home.  I have prescribed you Percocet for breakthrough pain as needed.  Please only take this as prescribed and do not take prior to driving as it could make you sleepy. Return if you develop worsening symptoms while on antibiotics.

## 2021-06-14 NOTE — ED Triage Notes (Signed)
Pt arrives via EMS- pt was moving grass yesterday and his lawn mower handles hit him in the scrotum. Pt was hit in his scrotum today with a chair. Pt's testicles are swollen and pt is In terrible pain. Can hardly walk, but is able to urinate.

## 2021-06-14 NOTE — ED Notes (Signed)
Pt verbally aggressive w/ this RN as this RN was drawing pt blood. Pt uttering things under his breath w/ aggressive tone. Pt irritable about waiting so long and not being able to eat. Pt stated, "You ought to do all that shit at home". Asked pt for clarification and he said, "nothing".

## 2021-06-14 NOTE — ED Provider Notes (Signed)
[facility] EMERGENCY DEPARTMENT Provider Note   CSN: [id number] Arrival date & time: 06/14/21  1115     History Past medical history of tobacco use and CHF Chief Complaint  Patient presents with  . Testicle Pain    Caleb Landry is a 51 y.o. male. He presents to the emergency room with a chief complaint of testicular pain.  He states that 5 days ago he was mowing his lawn when he ran into the handles with his scrotum.  He says he had some pain at that time but did not really think much of it.  He said over the next couple days he started noticing some scrotal swelling.  He states that 2 days ago he accidentally hit a chair with his testicles and had significant pain after this.  He has been noticing increased scrotal swelling and redness.  He has been feeling a little bit hot and cold over the past couple days as well.  He denies any known fevers at home.  He denies any dysuria or inability to urinate. He was last sexually active three weeks ago and has the same partner.   Testicle Pain       Home Medications Prior to Admission medications   Medication Sig Start Date End Date Taking? Authorizing Provider  doxycycline (VIBRAMYCIN) 100 MG capsule Take 1 capsule (100 mg total) by mouth 2 (two) times daily for 10 days. 06/14/21 06/24/21 Yes Wyonia Fontanella, Adora Fridge, PA-C  oxyCODONE-acetaminophen (PERCOCET/ROXICET) 5-325 MG tablet Take 1 tablet by mouth every 6 (six) hours as needed for up to 5 days for severe pain. 06/14/21 06/19/21 Yes Dima Ferrufino, Adora Fridge, PA-C  albuterol (PROVENTIL) (2.5 MG/3ML) 0.083% nebulizer solution Take 3 mLs (2.5 mg total) by nebulization every 6 (six) hours as needed for wheezing or shortness of breath. 03/04/21   Passmore, Jake Church I, NP  albuterol (VENTOLIN HFA) 108 (90 Base) MCG/ACT inhaler INHALE 2 PUFFS INTO THE LUNGS EVERY 6 HOURS AS NEEDED FOR WHEEZING OR SHORTNESS OF BREATH 05/20/21   Passmore, Jake Church I, NP  bisoprolol (ZEBETA) 5 MG tablet Take 0.5  tablets (2.5 mg total) by mouth daily. 02/25/21   Bensimhon, Shaune Pascal, MD  empagliflozin (JARDIANCE) 10 MG TABS tablet Take 1 tablet (10 mg total) by mouth daily. 08/25/20   Bensimhon, Shaune Pascal, MD  famotidine (PEPCID) 40 MG tablet Take 0.5 tablets (20 mg total) by mouth at bedtime for 5 days. 01/24/21 01/31/22  Bo Merino I, NP  furosemide (LASIX) 40 MG tablet Take 1 tablet (40 mg total) by mouth daily as needed. 01/31/21   Milford, Maricela Bo, FNP  gabapentin (NEURONTIN) 300 MG capsule Take 1 capsule (300 mg total) by mouth 2 (two) times daily. 03/04/21 08/31/21  Bo Merino I, NP  HYDROcodone-acetaminophen (NORCO/VICODIN) 5-325 MG tablet Take 1-2 tablets by mouth every 6 (six) hours as needed. 03/09/21   Trula Slade, DPM  nystatin-triamcinolone ointment (MYCOLOG) Apply 1 application topically 2 (two) times daily. Apply to toenails 02/03/21   Trula Slade, DPM  potassium chloride SA (KLOR-CON M) 20 MEQ tablet Take 1 tablet (20 mEq total) by mouth daily as needed. Take when you take your lasix 01/31/21   Rafael Bihari, FNP  sacubitril-valsartan (ENTRESTO) 97-103 MG Take 1 tablet by mouth 2 (two) times daily. 05/17/21   Bensimhon, Shaune Pascal, MD  spironolactone (ALDACTONE) 25 MG tablet Take 1 tablet (25 mg total) by mouth daily. 02/25/21   Bensimhon, Shaune Pascal, MD  triamcinolone (KENALOG) 0.025 %  ointment Apply 1 application topically 2 (two) times daily. Apply to skin rash 02/03/21   Trula Slade, DPM      Allergies    Penicillins    Review of Systems   Review of Systems  Constitutional:  Positive for chills. Negative for fever.  Genitourinary:  Positive for scrotal swelling and testicular pain. Negative for decreased urine volume, difficulty urinating, dysuria, flank pain, hematuria and urgency.  All other systems reviewed and are negative.   Physical Exam Updated Vital Signs BP 97/63   Pulse 97   Temp 98.9 F (37.2 C) (Oral)   Resp 18   SpO2 95%  Physical Exam Vitals  and nursing note reviewed. Exam conducted with a chaperone present.  Constitutional:      General: He is not in acute distress.    Appearance: Normal appearance. He is well-developed. He is not ill-appearing, toxic-appearing or diaphoretic.  HENT:     Head: Normocephalic and atraumatic.     Nose: No nasal deformity.     Mouth/Throat:     Lips: Pink. No lesions.  Eyes:     General: Gaze aligned appropriately. No scleral icterus.       Right eye: No discharge.        Left eye: No discharge.     Conjunctiva/sclera: Conjunctivae normal.     Right eye: Right conjunctiva is not injected. No exudate or hemorrhage.    Left eye: Left conjunctiva is not injected. No exudate or hemorrhage. Pulmonary:     Effort: Pulmonary effort is normal. No respiratory distress.  Genitourinary:    Testes:        Right: Tenderness and swelling present.        Left: Tenderness and swelling present.       Comments: Scrotum is very edematous and erythematous bilaterally.  Tender to the touch.  No abnormal drainage noted. No crepitus Skin:    General: Skin is warm and dry.  Neurological:     Mental Status: He is alert and oriented to person, place, and time.  Psychiatric:        Mood and Affect: Mood normal.        Speech: Speech normal.        Behavior: Behavior normal. Behavior is cooperative.        ED Results / Procedures / Treatments   Labs (all labs ordered are listed, but only abnormal results are displayed) Labs Reviewed  COMPREHENSIVE METABOLIC PANEL - Abnormal; Notable for the following components:      Result Value   Sodium 132 (*)    Chloride 97 (*)    Glucose, Bld 106 (*)    Albumin 3.4 (*)    All other components within normal limits  CBC WITH DIFFERENTIAL/PLATELET - Abnormal; Notable for the following components:   WBC 13.8 (*)    RBC 3.97 (*)    Hemoglobin 12.0 (*)    HCT 35.6 (*)    Neutro Abs 10.9 (*)    Monocytes Absolute 1.5 (*)    All other components within normal  limits  URINALYSIS, ROUTINE W REFLEX MICROSCOPIC - Abnormal; Notable for the following components:   APPearance HAZY (*)    Glucose, UA >=500 (*)    Hgb urine dipstick SMALL (*)    Ketones, ur 5 (*)    Nitrite POSITIVE (*)    Bacteria, UA MANY (*)    All other components within normal limits  LACTIC ACID, PLASMA    EKG None  Radiology US SCROTUM W/DOPPLER  Result Date: 06/14/2021 CLINICAL DATA:  Acute left testicular pain. EXAM: SCROTAL ULTRASOUND DOPPLER ULTRASOUND OF THE TESTICLES TECHNIQUE: Complete ultrasound examination of the testicles, epididymis, and other scrotal structures was performed. Color and spectral Doppler ultrasound were also utilized to evaluate blood flow to the testicles. COMPARISON:  None Available. FINDINGS: Right testicle Measurements: 3.7 x 2.3 x 1.5 cm. No mass or microlithiasis visualized. Left testicle Measurements: 4.1 x 3.4 x 3.3 cm. No mass or microlithiasis visualized. Testicle is hypervascular. Right epididymis:  Normal in size and appearance. Left epididymis:  Enlarged and hypervascular. Hydrocele: Complex and septated left-sided hydrocele is noted which contains echogenic areas suggesting possible hemorrhage. Small simple right hydrocele is noted. Varicocele:  None visualized. Pulsed Doppler interrogation of both testes demonstrates normal low resistance arterial and venous waveforms bilaterally. Scrotal skin thickening is noted on the left. IMPRESSION: No evidence of testicular mass or torsion. The left testicle and epididymis are hypervascular suggesting epididymo-orchitis. Complex and multi septated left-sided hydrocele is noted which contains echogenic areas suggesting possible hemorrhagic component. It is uncertain if this is infectious or traumatic in etiology. Electronically Signed   By: Marijo Conception M.D.   On: 06/14/2021 12:27    Procedures Procedures   Medications Ordered in ED Medications  HYDROmorphone (DILAUDID) injection 1 mg (1 mg  Intramuscular Given 06/14/21 1752)  doxycycline (VIBRA-TABS) tablet 100 mg (100 mg Oral Given 06/14/21 1917)    ED Course/ Medical Decision Making/ A&P Clinical Course as of 06/14/21 2049  Tue Jun 14, 2021  1855 Patient admits to last being sexually active about three weeks ago. He only has one partner.  [GL]    Clinical Course User Index [GL] Sherre Poot Adora Fridge, PA-C                           Medical Decision Making Amount and/or Complexity of Data Reviewed Labs: ordered.  Risk Prescription drug management.    MDM  This is a 51 y.o. male who presents to the ED with testicular pain and swelling The differential of this patient includes but is not limited to traumatic testicle injury (torsion, hemorrhage, or rupture), epididymitis, orchitis, etc.  My Impression, Plan, and ED Course:  Patient has been persistently tachycardic in the 110s while in the ED.  He did have a fever to 100.4 that resolved on its own.  His exam reveals very swollen, tender, edematous bilateral scrotum. Patient's history does not quite line up with this being a traumatic injury and with persistent tachycardia and fever, will add on labs/urine to evaluate for sepsis or infectious process. Scrotal US was ordered in triage.  I personally ordered, reviewed, and interpreted all laboratory work and imaging and agree with radiologist interpretation. Results interpreted below:  Scrotal US: No evidence of an ultrasound of testicular mass or torsion.  Left testicle and epididymis are hypervascular suggesting epididymoorchitis.  There is also complex and multiseptated left-sided hydrocele which contains an echogenic area suggesting possible hemorrhagic component.  Ultrasound read is uncertain if this is infectious versus traumatic in etiology. Labs reveal leukocytosis of 13.8, Hgb 12.0, Na 132, stable kidney function, Lactic Acid 0.9. Urine with many bacteria, 0-5 wbc, Nitrite +, >500 glucose  I think that clinical picture  seems more consistent with infectious etiology. Consistent with epididymitis. He is not septic. I have low suspicion for forniers gangrene. I discussed case with Leta Baptist from [facility] and she recommended discharge on  Doxycycline for 10 days with close follow up. He is dumping glucose in his urine. He would benefit for outpatient evaluation of diabetes.  I discussed findings with patient and he has stable vitals. He does not meet admission requirements. Stable for discharge.   Charting Requirements Additional history is obtained from:  Independent historian External Records from outside source obtained and reviewed including: reviewed prior office visit form 05/16/21 Social Determinants of Health:  none Pertinant PMH that complicates patient's illness: n/a  Patient Care Problems that were addressed during this visit: - Scrotal trauma: Acute illness with complication - Epididymitis: Acute illness with systemic symptoms Medications given in ED: Dilaudid Reevaluation of the patient after these medicines showed that the patient improved I have reviewed home medications and made changes accordingly. Doxycycline and Percocet ordered. Critical Care Interventions: n/a Consultations: Urology Disposition: discharge  This is a supervised visit with my attending physician, Dr. Roslynn Amble. We have discussed this patient and they have altered the plan as needed.  Portions of this note were generated with [employer]. Dictation errors may occur despite best attempts at proofreading.    Final Clinical Impression(s) / ED Diagnoses Final diagnoses:  Orchitis, epididymitis, and epididymo-orchitis  Scrotal trauma, initial encounter    Rx / DC Orders ED Discharge Orders          Ordered    doxycycline (VIBRAMYCIN) 100 MG capsule  2 times daily        06/14/21 1919    oxyCODONE-acetaminophen (PERCOCET/ROXICET) 5-325 MG tablet  Every 6 hours PRN        06/14/21 1919               Sheila Oats 06/14/21 2049    Lucrezia Starch, MD 06/15/21 1525

## 2021-06-16 ENCOUNTER — Ambulatory Visit (INDEPENDENT_AMBULATORY_CARE_PROVIDER_SITE_OTHER): Payer: 59

## 2021-06-16 ENCOUNTER — Ambulatory Visit (INDEPENDENT_AMBULATORY_CARE_PROVIDER_SITE_OTHER): Payer: 59 | Admitting: Podiatry

## 2021-06-16 DIAGNOSIS — Z969 Presence of functional implant, unspecified: Secondary | ICD-10-CM

## 2021-06-16 DIAGNOSIS — Z9889 Other specified postprocedural states: Secondary | ICD-10-CM | POA: Diagnosis not present

## 2021-06-16 DIAGNOSIS — M2042 Other hammer toe(s) (acquired), left foot: Secondary | ICD-10-CM | POA: Diagnosis not present

## 2021-06-16 DIAGNOSIS — M79672 Pain in left foot: Secondary | ICD-10-CM

## 2021-06-16 DIAGNOSIS — M79671 Pain in right foot: Secondary | ICD-10-CM | POA: Diagnosis not present

## 2021-06-16 NOTE — Patient Instructions (Signed)

## 2021-06-19 NOTE — Progress Notes (Signed)
Subjective: 51 year old male presents the office today for follow-up evaluation after regarding hammertoe repair of digits 1 through 5 on the right foot.  He states he has been doing well and the swelling has improved he is back in regular shoes.  He wants to proceed with surgery in the left foot at this time for the same issues.  The left foot has tried shoe modifications, offloading, padding that significant resolution.  Denies any fevers or chills.  Objective: AAO x3, NAD DP/PT pulses palpable bilaterally, CRT less than 3 seconds On the right foot incisions are all well-healed.  There is still some mild residual edema but there is no erythema or warmth.  No signs of infection.  The toes are in a rectus position and there is only slight discomfort upon palpation.  On the left foot there is a rigid hammertoe contractures present to digits 1 through 5 causing tenderness along the IPJ's.  No other areas of discomfort.  MMT 5/5. No pain with calf compression, swelling, warmth, erythema  Assessment: Status post right foot hammertoe repair, doing well; left foot symptomatic hammertoes  Plan: -All treatment options discussed with the patient including all alternatives, risks, complications.  -X-rays obtained reviewed of the right foot.  There is loosening of the hardware of the hallux and there is a lucency along the arthrodesis site.  Clinically the toe was doing well and the hardware is not prominent. -I discussed with her removal of the hardware of the right foot but as its not causing issues right now and no clinical signs of infection we can leave it intact but we discussed doing surgery on the left foot possibly removal of the hardware on the right foot. -Elective left foot we discussed hammertoe repair of digits 1 through 5 including hallux IPJ arthrodesis, PIPJ arthrodesis of digits 2, 3, 4 and PIPJ arthroplasty of the fifth digit.  He would like to proceed with this. -The incision placement as  well as the postoperative course was discussed with the patient. I discussed risks of the surgery which include, but not limited to, infection, bleeding, pain, swelling, need for further surgery, delayed or nonhealing, painful or ugly scar, numbness or sensation changes, over/under correction, recurrence, transfer lesions, further deformity, hardware failure, DVT/PE, loss of toe/foot. Patient understands these risks and wishes to proceed with surgery. The surgical consent was reviewed with the patient all 3 pages were signed. No promises or guarantees were given to the outcome of the procedure. All questions were answered to the best of my ability. Before the surgery the patient was encouraged to call the office if there is any further questions. The surgery will be performed at the [facility] on an outpatient basis. -Patient encouraged to call the office with any questions, concerns, change in symptoms.

## 2021-07-06 ENCOUNTER — Other Ambulatory Visit: Payer: Self-pay | Admitting: Nurse Practitioner

## 2021-07-06 DIAGNOSIS — J4520 Mild intermittent asthma, uncomplicated: Secondary | ICD-10-CM

## 2021-07-11 ENCOUNTER — Telehealth: Payer: Self-pay | Admitting: Urology

## 2021-07-11 NOTE — Telephone Encounter (Signed)
No additional notes

## 2021-07-11 NOTE — Telephone Encounter (Signed)
DOS - 07/27/21  HAMMERTOE REPAIR 2-5 LEFT --- 38453 HALLUX IPJ FUSION 1ST LEFT --- 64680 REMOVAL FIXATION DEEP RIGHT --- 20680  [employer] EFFECTIVE DATE - 01/02/21  PLAN DEDUCTIBLE - $0.00 OUT OF POCKET - $3,000.00 W/ $3,000.00 REMAINING COINSURANCE - 10% COPAY - $0.00   PER [employer]'S AUTO MATIVE SYSTEM FOR CPT CODES 32122, [phone] AND 20680 NO PRIOR AUTH IS REQUIRED.  REF # [id number] REF # [id number] REF # [phone]

## 2021-07-25 ENCOUNTER — Ambulatory Visit: Payer: Medicaid Other | Admitting: Nurse Practitioner

## 2021-07-27 ENCOUNTER — Other Ambulatory Visit: Payer: Self-pay | Admitting: Podiatry

## 2021-07-27 DIAGNOSIS — M2042 Other hammer toe(s) (acquired), left foot: Secondary | ICD-10-CM | POA: Diagnosis not present

## 2021-07-27 MED ORDER — CLINDAMYCIN HCL 300 MG PO CAPS: 300.0000 mg | ORAL_CAPSULE | Freq: Three times a day (TID) | ORAL | 0 refills | Status: DC

## 2021-07-27 MED ORDER — HYDROCODONE-ACETAMINOPHEN 5-325 MG PO TABS: 1.0000 | ORAL_TABLET | Freq: Four times a day (QID) | ORAL | 0 refills | Status: DC | PRN

## 2021-07-27 NOTE — Progress Notes (Signed)
Postop medications sent 

## 2021-07-28 ENCOUNTER — Ambulatory Visit (INDEPENDENT_AMBULATORY_CARE_PROVIDER_SITE_OTHER): Payer: Commercial Managed Care - HMO | Admitting: Podiatry

## 2021-07-28 DIAGNOSIS — G8918 Other acute postprocedural pain: Secondary | ICD-10-CM

## 2021-08-01 ENCOUNTER — Ambulatory Visit (INDEPENDENT_AMBULATORY_CARE_PROVIDER_SITE_OTHER): Payer: Commercial Managed Care - HMO

## 2021-08-01 ENCOUNTER — Ambulatory Visit (INDEPENDENT_AMBULATORY_CARE_PROVIDER_SITE_OTHER): Payer: Commercial Managed Care - HMO | Admitting: Nurse Practitioner

## 2021-08-01 ENCOUNTER — Encounter: Payer: Self-pay | Admitting: Nurse Practitioner

## 2021-08-01 ENCOUNTER — Encounter: Payer: Self-pay | Admitting: Podiatry

## 2021-08-01 ENCOUNTER — Ambulatory Visit (INDEPENDENT_AMBULATORY_CARE_PROVIDER_SITE_OTHER): Payer: Commercial Managed Care - HMO | Admitting: Podiatry

## 2021-08-01 VITALS — BP 114/59 | HR 75 | Temp 98.5°F | Resp 17 | Ht 70.0 in | Wt 180.0 lb

## 2021-08-01 DIAGNOSIS — Z9889 Other specified postprocedural states: Secondary | ICD-10-CM

## 2021-08-01 DIAGNOSIS — M2041 Other hammer toe(s) (acquired), right foot: Secondary | ICD-10-CM

## 2021-08-01 DIAGNOSIS — J302 Other seasonal allergic rhinitis: Secondary | ICD-10-CM

## 2021-08-01 DIAGNOSIS — G629 Polyneuropathy, unspecified: Secondary | ICD-10-CM | POA: Diagnosis not present

## 2021-08-01 MED ORDER — CETIRIZINE HCL 10 MG PO TABS: 10.0000 mg | ORAL_TABLET | Freq: Every day | ORAL | 11 refills | Status: DC

## 2021-08-01 MED ORDER — GABAPENTIN 300 MG PO CAPS: 600.0000 mg | ORAL_CAPSULE | Freq: Two times a day (BID) | ORAL | 5 refills | Status: DC

## 2021-08-01 NOTE — Progress Notes (Signed)
Patient presents to the office today after having surgery yesterday as his bandages were too tight.  He was seen by nursing staff today and the bandage was loosened and the new cam boot was dispensed as it was too tight.  After this the patient expressed improvement.  His scheduled follow-up on Monday and I will see him then but encouraged to call any questions or concerns in the meantime or any changes.

## 2021-08-01 NOTE — Assessment & Plan Note (Signed)
-   gabapentin (NEURONTIN) 300 MG capsule; Take 2 capsules (600 mg total) by mouth 2 (two) times daily.  Dispense: 120 capsule; Refill: 5   2. Seasonal allergies  - cetirizine (ZYRTEC) 10 MG tablet; Take 1 tablet (10 mg total) by mouth daily.  Dispense: 30 tablet; Refill: 11   Follow up:  Follow up 6 months for physical

## 2021-08-01 NOTE — Progress Notes (Signed)
Subjective: Caleb Landry is a 51 y.o. is seen today in office s/p left foot hammertoe repair toes 1-5 preformed on 08/27/2021. He states ther pain is controlled. Denies any systemic complaints such as fevers, chills, nausea, vomiting. No calf pain, chest pain, shortness of breath.   Objective: General: No acute distress, AAOx3  DP/PT pulses palpable 2/4, CRT < 3 sec to all digits.  Protective sensation intact. Motor function intact.  Left foot: Incision is well coapted without any evidence of dehiscence with sutures intact. There is no surrounding erythema, ascending cellulitis, fluctuance, crepitus, malodor, drainage/purulence. There is mild edema around the surgical site. There is mild pain along the surgical site. K-wires intact without any drainage. Toes are in a rectus position.  No other areas of tenderness to bilateral lower extremities.  No other open lesions or pre-ulcerative lesions.  No pain with calf compression, swelling, warmth, erythema.   Assessment and Plan:  Status post left foot hammertoe repair toes 1-5, doing well with no complications   -Treatment options discussed including all alternatives, risks, and complications -X-rays obtained and reviewed of the left foot. 3 views obtained. Status post hammertoe repair with hardware intact.  -Antibiotic ointment and bandage applied.  -Continue in offloading boot.  -Ice/elevation -Pain medication as needed. -Monitor for any clinical signs or symptoms of infection and DVT/PE and directed to call the office immediately should any occur or go to the ER. -Follow-up as scheduled for possible suture removal or sooner if any problems arise. In the meantime, encouraged to call the office with any questions, concerns, change in symptoms.   Ovid Curd, DPM

## 2021-08-01 NOTE — Progress Notes (Signed)
@Patient  ID: , male    DOB: Nov 13, 1970, 51 y.o.   MRN: [id number]  Chief Complaint  Patient presents with   Follow-up    6 mth f/u    Referring provider: [id number], NP   HPI  Caleb Landry 51 y.o. male  has a past medical history of Asthma and Neuropathy, CHF.  Patient presents today for a follow-up visit.  Patient has been doing well.  He did recently have foot surgery.  He is following with podiatry.  Patient is requesting refill on Neurontin and states that he would like for this to be increased to help manage his neuropathy little better.  Patient cardiology for CHF.  Patient is requesting a prescription for seasonal allergies. Denies f/c/s, n/v/d, hemoptysis, PND, leg swelling Denies chest pain or edema  Patient declines labs today.     Allergies  Allergen Reactions   Penicillins Hives     There is no immunization history on file for this patient.  Past Medical History:  Diagnosis Date   Asthma    Neuropathy     Tobacco History: Social History   Tobacco Use  Smoking Status Former   Packs/day: 0.50   Years: 30.00   Total pack years: 15.00   Types: Cigarettes   Quit date: 08/13/2020   Years since quitting: 0.9  Smokeless Tobacco Never   Counseling given: Not Answered   Outpatient Encounter Medications as of 08/01/2021  Medication Sig   albuterol (PROVENTIL) (2.5 MG/3ML) 0.083% nebulizer solution Take 3 mLs (2.5 mg total) by nebulization every 6 (six) hours as needed for wheezing or shortness of breath.   albuterol (VENTOLIN HFA) 108 (90 Base) MCG/ACT inhaler INHALE 2 PUFFS INTO THE LUNGS EVERY 6 HOURS AS NEEDED FOR WHEEZING OR SHORTNESS OF BREATH   bisoprolol (ZEBETA) 5 MG tablet Take 0.5 tablets (2.5 mg total) by mouth daily.   cetirizine (ZYRTEC) 10 MG tablet Take 1 tablet (10 mg total) by mouth daily.   empagliflozin (JARDIANCE) 10 MG TABS tablet Take 1 tablet (10 mg total) by mouth daily.   furosemide (LASIX) 40 MG tablet Take 1  tablet (40 mg total) by mouth daily as needed.   HYDROcodone-acetaminophen (NORCO/VICODIN) 5-325 MG tablet Take 1-2 tablets by mouth every 6 (six) hours as needed.   HYDROcodone-acetaminophen (NORCO/VICODIN) 5-325 MG tablet Take 1-2 tablets by mouth every 6 (six) hours as needed.   potassium chloride SA (KLOR-CON M) 20 MEQ tablet Take 1 tablet (20 mEq total) by mouth daily as needed. Take when you take your lasix   sacubitril-valsartan (ENTRESTO) 97-103 MG Take 1 tablet by mouth 2 (two) times daily.   spironolactone (ALDACTONE) 25 MG tablet Take 1 tablet (25 mg total) by mouth daily.   [DISCONTINUED] gabapentin (NEURONTIN) 300 MG capsule Take 1 capsule (300 mg total) by mouth 2 (two) times daily.   clindamycin (CLEOCIN) 300 MG capsule Take 1 capsule (300 mg total) by mouth 3 (three) times daily. (Patient not taking: Reported on 08/01/2021)   famotidine (PEPCID) 40 MG tablet Take 0.5 tablets (20 mg total) by mouth at bedtime for 5 days. (Patient not taking: Reported on 08/01/2021)   gabapentin (NEURONTIN) 300 MG capsule Take 2 capsules (600 mg total) by mouth 2 (two) times daily.   nystatin-triamcinolone ointment (MYCOLOG) Apply 1 application topically 2 (two) times daily. Apply to toenails (Patient not taking: Reported on 08/01/2021)   triamcinolone (KENALOG) 0.025 % ointment Apply 1 application topically 2 (two) times daily. Apply to skin rash (Patient  not taking: Reported on 08/01/2021)   No facility-administered encounter medications on file as of 08/01/2021.     Review of Systems  Review of Systems  Constitutional: Negative.   HENT: Negative.    Cardiovascular: Negative.   Gastrointestinal: Negative.   Allergic/Immunologic: Negative.   Neurological: Negative.   Psychiatric/Behavioral: Negative.         Physical Exam  BP (!) 114/59 (BP Location: Right Arm, Patient Position: Sitting, Cuff Size: Normal)   Pulse 75   Temp 98.5 F (36.9 C)   Resp 17   Ht 5\' 10"  (1.778 m)   Wt 180 lb  (81.6 kg)   SpO2 97%   BMI 25.83 kg/m   Wt Readings from Last 5 Encounters:  08/01/21 180 lb (81.6 kg)  04/26/21 179 lb 8 oz (81.4 kg)  04/05/21 179 lb 0.6 oz (81.2 kg)  01/31/21 184 lb 9.6 oz (83.7 kg)  01/24/21 183 lb 12.8 oz (83.4 kg)     Physical Exam Vitals and nursing note reviewed.  Constitutional:      General: He is not in acute distress.    Appearance: He is well-developed.  Cardiovascular:     Rate and Rhythm: Normal rate and regular rhythm.  Pulmonary:     Effort: Pulmonary effort is normal.     Breath sounds: Normal breath sounds.  Skin:    General: Skin is warm and dry.  Neurological:     Mental Status: He is alert and oriented to person, place, and time.       Assessment & Plan:   Neuropathy - gabapentin (NEURONTIN) 300 MG capsule; Take 2 capsules (600 mg total) by mouth 2 (two) times daily.  Dispense: 120 capsule; Refill: 5   2. Seasonal allergies  - cetirizine (ZYRTEC) 10 MG tablet; Take 1 tablet (10 mg total) by mouth daily.  Dispense: 30 tablet; Refill: 11   Follow up:  Follow up 6 months for physical     01/26/21, NP 08/01/2021

## 2021-08-01 NOTE — Patient Instructions (Addendum)
1. Neuropathy  - gabapentin (NEURONTIN) 300 MG capsule; Take 2 capsules (600 mg total) by mouth 2 (two) times daily.  Dispense: 120 capsule; Refill: 5   2. Seasonal allergies  - cetirizine (ZYRTEC) 10 MG tablet; Take 1 tablet (10 mg total) by mouth daily.  Dispense: 30 tablet; Refill: 11   Follow up:  Follow up 6 months for physical

## 2021-08-10 ENCOUNTER — Encounter: Payer: Self-pay | Admitting: Urology

## 2021-08-10 ENCOUNTER — Ambulatory Visit (INDEPENDENT_AMBULATORY_CARE_PROVIDER_SITE_OTHER): Payer: Commercial Managed Care - HMO | Admitting: Urology

## 2021-08-10 VITALS — BP 113/74 | HR 86 | Ht 70.0 in | Wt 185.0 lb

## 2021-08-10 DIAGNOSIS — F17210 Nicotine dependence, cigarettes, uncomplicated: Secondary | ICD-10-CM

## 2021-08-10 DIAGNOSIS — N453 Epididymo-orchitis: Secondary | ICD-10-CM

## 2021-08-10 DIAGNOSIS — Z87438 Personal history of other diseases of male genital organs: Secondary | ICD-10-CM | POA: Diagnosis not present

## 2021-08-10 DIAGNOSIS — N4889 Other specified disorders of penis: Secondary | ICD-10-CM

## 2021-08-10 DIAGNOSIS — N4822 Cellulitis of corpus cavernosum and penis: Secondary | ICD-10-CM

## 2021-08-10 DIAGNOSIS — I8390 Asymptomatic varicose veins of unspecified lower extremity: Secondary | ICD-10-CM | POA: Insufficient documentation

## 2021-08-10 NOTE — Progress Notes (Signed)
   08/10/21 12:51 PM   Caleb Landry October 04, 1970 [id number]  CC: Penile lesion  HPI: 51 year old male who originally presented to the ER on 06/14/2021 with testicular pain and felt to have epididymitis based on symptoms, urinalysis with infection, and scrotal ultrasound.  He was treated with doxycycline for 10 days.  It sounds like at some point he cut himself shaving on the penis and developed a abscess on the right lateral aspect of the penis that resolved with antibiotics.  He never underwent any incision and drainage or procedures.  This has since resolved and he denies any complaints today.   PMH: Past Medical History:  Diagnosis Date   Asthma    Neuropathy     Surgical History: Past Surgical History:  Procedure Laterality Date   APPENDECTOMY     HERNIA REPAIR     RIGHT/LEFT HEART CATH AND CORONARY ANGIOGRAPHY N/A 07/19/2020   Procedure: RIGHT/LEFT HEART CATH AND CORONARY ANGIOGRAPHY;  Surgeon: Dolores Patty, MD;  Location: [facility];  Service: Cardiovascular;  Laterality: N/A;     Family History: Family History  Problem Relation Age of Onset   Crohn's disease Mother     Social History:  reports that he has been smoking cigarettes. He has a 15.00 pack-year smoking history. He has been exposed to tobacco smoke. He has never used smokeless tobacco. He reports current alcohol use. He reports that he does not use drugs.  Physical Exam: BP 113/74   Pulse 86   Ht 5\' 10"  (1.778 m)   Wt 185 lb (83.9 kg)   BMI 26.54 kg/m    Constitutional:  Alert and oriented, No acute distress. Cardiovascular: No clubbing, cyanosis, or edema. Respiratory: Normal respiratory effort, no increased work of breathing. GI: Abdomen is soft, nontender, nondistended, no abdominal masses GU: Phallus with patent meatus, healing scar at the right lateral aspect of the penis with no fluctuance, erythema, drainage, patent meatus  Assessment & Plan:   51 year old male with recent  epididymitis as well as likely small penile abscess from an ingrown hair after shaving.  On exam this has resolved entirely, and he denies any complaints today.  I reviewed the outside ER notes and imaging.  Reassurance was provided and return precautions discussed.  Follow-up with urology as needed   45, MD 08/10/2021  [facility] [address], [address] [place], [place] [phone]

## 2021-08-11 ENCOUNTER — Ambulatory Visit (INDEPENDENT_AMBULATORY_CARE_PROVIDER_SITE_OTHER): Payer: Commercial Managed Care - HMO | Admitting: Podiatry

## 2021-08-11 DIAGNOSIS — Z9889 Other specified postprocedural states: Secondary | ICD-10-CM

## 2021-08-11 DIAGNOSIS — M2042 Other hammer toe(s) (acquired), left foot: Secondary | ICD-10-CM

## 2021-08-11 NOTE — Progress Notes (Signed)
Subjective: Chief Complaint  Patient presents with   Routine Post Op     LT FOOT HAMMERTOE REPAIR OF 1-5, POSS REMOVAL SCREW RT BIG TOE    Caleb Landry is a 51 y.o. is seen today in office s/p left foot hammertoe repair toes 1-5 preformed on 08/27/2021.States that the pain has been "tolerable". No fevers or chills. Still wearing the CAM boot. No new concerns.   Objective: General: No acute distress, AAOx3  DP/PT pulses palpable 2/4, CRT < 3 sec to all digits.  Protective sensation intact. Motor function intact.  Left foot: Incision is well coapted without any evidence of dehiscence with sutures intact. There is no surrounding erythema, ascending cellulitis, fluctuance, crepitus, malodor, drainage/purulence. There is mild however improved edema around the surgical site. There is minimal pain along the surgical site. K-wires intact without any drainage. Toes are in a rectus position.  No other areas of tenderness to bilateral lower extremities.  No other open lesions or pre-ulcerative lesions.  No pain with calf compression, swelling, warmth, erythema.   Assessment and Plan:  Status post left foot hammertoe repair toes 1-5, doing well with no complications   -Treatment options discussed including all alternatives, risks, and complications -Sutures removed by complications incision is well coapted.  Small amount of antibiotic ointment and a bandage applied.  He can wash the foot with soap and water, dry thoroughly apply a similar bandage if he feels comfortable otherwise he will leave the bandage intact until next appointment. -Made a cam boot. -Ice/elevation -Pain medication as needed. -Monitor for any clinical signs or symptoms of infection and DVT/PE and directed to call the office immediately should any occur or go to the ER. -Follow-up as scheduled for sooner if any problems arise. In the meantime, encouraged to call the office with any questions, concerns, change in symptoms.   *X-ray  next appointment  Ovid Curd, DPM

## 2021-08-24 ENCOUNTER — Other Ambulatory Visit: Payer: Self-pay | Admitting: Podiatry

## 2021-08-24 DIAGNOSIS — Z9889 Other specified postprocedural states: Secondary | ICD-10-CM

## 2021-08-25 ENCOUNTER — Ambulatory Visit (INDEPENDENT_AMBULATORY_CARE_PROVIDER_SITE_OTHER): Payer: Self-pay | Admitting: Podiatry

## 2021-08-25 ENCOUNTER — Ambulatory Visit (INDEPENDENT_AMBULATORY_CARE_PROVIDER_SITE_OTHER): Payer: Commercial Managed Care - HMO

## 2021-08-25 DIAGNOSIS — Z9889 Other specified postprocedural states: Secondary | ICD-10-CM

## 2021-08-25 DIAGNOSIS — M2042 Other hammer toe(s) (acquired), left foot: Secondary | ICD-10-CM

## 2021-08-27 NOTE — Progress Notes (Signed)
Subjective: Chief Complaint  Patient presents with   Routine Post Op    DOS 07/27/2021 LT FOOT HAMMERTOE REPAIR OF 1-5, POSS REMOVAL SCREW RT BIG TOE, NO N/V/F/C/SOB, PATIENT DENIES ANY PAIN, LITTLE SWELLING, TX: CAM BOOT, X-RAYS TAKEN TODAY      Aniken Monestime is a 51 y.o. is seen today in office s/p left foot hammertoe repair toes 1-5 preformed on 08/27/2021.  States his pain is controlled.  He is hoping that the wires can come out today.  No fevers or chills. Still wearing the CAM boot. No new concerns.   Objective: General: No acute distress, AAOx3  DP/PT pulses palpable 2/4, CRT < 3 sec to all digits.  Protective sensation intact. Motor function intact.  Left foot: Incision is well coapted without any evidence of dehiscence and scars are forming.  There is no surrounding erythema, ascending cellulitis, fluctuance, crepitus, malodor, drainage/purulence. There is slight edema around the surgical site. There is no significant pain along the surgical site. K-wires intact without any drainage. Toes are in a rectus position.  No other areas of tenderness to bilateral lower extremities.  No other open lesions or pre-ulcerative lesions.  No pain with calf compression, swelling, warmth, erythema.   Assessment and Plan:  Status post left foot hammertoe repair toes 1-5, doing well with no complications   -Treatment options discussed including all alternatives, risks, and complications -X-rays obtained and reviewed.  3 views of the foot were obtained.  There is no evidence of acute fracture.  Hardware intact. -Would like to leave the K wires intact for at least 2 more weeks.  Antibiotic ointment and bandage applied.  Continue cam boot, ice, elevation.  We will plan on removing K wires next appointment.  Return in about 2 weeks (around 09/08/2021).  Vivi Barrack DPM

## 2021-08-29 ENCOUNTER — Other Ambulatory Visit: Payer: Self-pay | Admitting: Podiatry

## 2021-08-29 ENCOUNTER — Encounter (HOSPITAL_COMMUNITY): Payer: Self-pay

## 2021-08-30 ENCOUNTER — Telehealth: Payer: Self-pay

## 2021-08-30 NOTE — Telephone Encounter (Signed)
Patient didn't request this medication the pharmacy did and I have already spoken to the patient and he is feel fine for now.

## 2021-08-30 NOTE — Telephone Encounter (Signed)
Patient is aware and verbalized understanding of information given. 

## 2021-08-31 ENCOUNTER — Encounter: Payer: Self-pay | Admitting: Podiatry

## 2021-08-31 ENCOUNTER — Other Ambulatory Visit (HOSPITAL_COMMUNITY): Payer: Self-pay

## 2021-08-31 ENCOUNTER — Other Ambulatory Visit: Payer: Self-pay | Admitting: Podiatry

## 2021-08-31 MED ORDER — HYDROCODONE-ACETAMINOPHEN 5-325 MG PO TABS: 1.0000 | ORAL_TABLET | Freq: Four times a day (QID) | ORAL | 0 refills | Status: DC | PRN

## 2021-09-13 ENCOUNTER — Other Ambulatory Visit: Payer: Self-pay | Admitting: *Deleted

## 2021-09-13 MED ORDER — SPIRONOLACTONE 25 MG PO TABS: 25.0000 mg | ORAL_TABLET | Freq: Every day | ORAL | 3 refills | Status: DC

## 2021-09-16 ENCOUNTER — Ambulatory Visit (INDEPENDENT_AMBULATORY_CARE_PROVIDER_SITE_OTHER): Payer: Commercial Managed Care - HMO

## 2021-09-16 ENCOUNTER — Ambulatory Visit (INDEPENDENT_AMBULATORY_CARE_PROVIDER_SITE_OTHER): Payer: Commercial Managed Care - HMO | Admitting: Podiatry

## 2021-09-16 ENCOUNTER — Other Ambulatory Visit: Payer: Self-pay | Admitting: *Deleted

## 2021-09-16 DIAGNOSIS — Z9889 Other specified postprocedural states: Secondary | ICD-10-CM

## 2021-09-16 DIAGNOSIS — M2042 Other hammer toe(s) (acquired), left foot: Secondary | ICD-10-CM

## 2021-09-16 MED ORDER — BISOPROLOL FUMARATE 5 MG PO TABS: 2.5000 mg | ORAL_TABLET | Freq: Every day | ORAL | 1 refills | Status: DC

## 2021-09-19 NOTE — Progress Notes (Signed)
Subjective: Chief Complaint  Patient presents with   Routine Post Op    Left foot hammertoe repair, Patient denies any pain, No  N/V/F/C/SOB X-Rays done today.   Burk Hoctor is a 51 y.o. is seen today in office s/p left foot hammertoe repair toes 1-5 preformed on 08/27/2021.  States he is doing well and is hoping the K wires come out today.  He is wearing a back into a regular shoe.  Denies any fevers or chills any new concerns today.    Objective: AAO x 3, NAD, presents wearing CAM boot.  DP/PT pulses palpable 2/4, CRT < 3 sec to all digits.  Protective sensation intact. Motor function intact.  Left foot: Incision is well coapted without any evidence of dehiscence and scars have formed.  K wires intact without any drainage or signs of infection.  The toes are rectus position.   No other open lesions or pre-ulcerative lesions.  No pain with calf compression, swelling, warmth, erythema.   Assessment and Plan:  Status post left foot hammertoe repair toes 1-5, doing well with no complications   -Treatment options discussed including all alternatives, risks, and complications -X-rays obtained and reviewed.  3 views of the foot were obtained.  There is no evidence of acute fracture.  Hardware intact. -I cleaned the K wires and they removed today in total without complications.  Antibiotic ointment and a bandage applied.  Discussed with him continue cam boot for now with a gradual transition to regular shoe as tolerated.  Continue ice, elevate as well as compression to help with the edema.  RTC 4-6 weeks or sooner if needed.   Trula Slade DPM

## 2021-10-13 ENCOUNTER — Other Ambulatory Visit (HOSPITAL_COMMUNITY): Payer: Self-pay

## 2021-10-13 ENCOUNTER — Encounter (HOSPITAL_COMMUNITY): Payer: Self-pay | Admitting: Internal Medicine

## 2021-10-13 ENCOUNTER — Ambulatory Visit (HOSPITAL_COMMUNITY)
Admission: RE | Admit: 2021-10-13 | Discharge: 2021-10-13 | Disposition: A | Discharge status: Home / Self Care | Payer: Commercial Managed Care - HMO | Source: Ambulatory Visit | Attending: Internal Medicine | Admitting: Internal Medicine

## 2021-10-13 VITALS — BP 100/60 | HR 76 | Wt 182.2 lb

## 2021-10-13 DIAGNOSIS — J45909 Unspecified asthma, uncomplicated: Secondary | ICD-10-CM | POA: Diagnosis not present

## 2021-10-13 DIAGNOSIS — Z79899 Other long term (current) drug therapy: Secondary | ICD-10-CM | POA: Diagnosis not present

## 2021-10-13 DIAGNOSIS — I5022 Chronic systolic (congestive) heart failure: Secondary | ICD-10-CM | POA: Insufficient documentation

## 2021-10-13 DIAGNOSIS — R202 Paresthesia of skin: Secondary | ICD-10-CM | POA: Diagnosis not present

## 2021-10-13 DIAGNOSIS — F17210 Nicotine dependence, cigarettes, uncomplicated: Secondary | ICD-10-CM | POA: Diagnosis not present

## 2021-10-13 DIAGNOSIS — Z5986 Financial insecurity: Secondary | ICD-10-CM | POA: Insufficient documentation

## 2021-10-13 DIAGNOSIS — Z720 Tobacco use: Secondary | ICD-10-CM

## 2021-10-13 LAB — COMPREHENSIVE METABOLIC PANEL
ALT: 15 U/L (ref 0–44)
AST: 21 U/L (ref 15–41)
Albumin: 3.9 g/dL (ref 3.5–5.0)
Alkaline Phosphatase: 76 U/L (ref 38–126)
Anion gap: 6 (ref 5–15)
BUN: 30 mg/dL — ABNORMAL HIGH (ref 6–20)
CO2: 28 mmol/L (ref 22–32)
Calcium: 8.9 mg/dL (ref 8.9–10.3)
Chloride: 104 mmol/L (ref 98–111)
Creatinine, Ser: 1.21 mg/dL (ref 0.61–1.24)
GFR, Estimated: >60 mL/min (ref >60)
Glucose, Bld: 89 mg/dL (ref 70–99)
Potassium: 3.8 mmol/L (ref 3.5–5.1)
Sodium: 138 mmol/L (ref 135–145)
Total Bilirubin: 0.7 mg/dL (ref 0.3–1.2)
Total Protein: 6.6 g/dL (ref 6.5–8.1)

## 2021-10-13 LAB — BRAIN NATRIURETIC PEPTIDE: B Natriuretic Peptide: 8.4 pg/mL (ref 0.0–100.0)

## 2021-10-13 MED ORDER — DAPAGLIFLOZIN PROPANEDIOL 10 MG PO TABS: 10.0000 mg | ORAL_TABLET | Freq: Every day | ORAL | 3 refills | Status: DC

## 2021-10-13 MED ORDER — ENTRESTO 97-103 MG PO TABS: 1.0000 | ORAL_TABLET | Freq: Two times a day (BID) | ORAL | 3 refills | Status: DC

## 2021-10-13 NOTE — Patient Instructions (Signed)
Change Jardaince to Farxiga 10 mg daily.  Labs done today, your results will be available in MyChart, we will contact you for abnormal readings.  Your physician has requested that you have an echocardiogram. Echocardiography is a painless test that uses sound waves to create images of your heart. It provides your doctor with information about the size and shape of your heart and how well your heart's chambers and valves are working. This procedure takes approximately one hour. There are no restrictions for this procedure.  Your physician recommends that you schedule a follow-up appointment in: 6 months ( April 2024)  ** please call the office in February 2024 to arrange your follow up appointment **  If you have any questions or concerns before your next appointment please send [place] a message through [place] or call our office at [phone].    TO LEAVE A MESSAGE FOR THE NURSE SELECT OPTION 2, PLEASE LEAVE A MESSAGE INCLUDING: YOUR NAME DATE OF BIRTH CALL BACK NUMBER REASON FOR CALL**this is important as we prioritize the call backs  YOU WILL RECEIVE A CALL BACK THE SAME DAY AS LONG AS YOU CALL BEFORE 4:00 PM  At the [facility], you and your health needs are our priority. We have a designated team specialized in the treatment of Heart Failure. This Care Team includes your primary Heart Failure Specialized Cardiologist (physician), Advanced Practice Providers (APPs- Physician Assistants and Nurse Practitioners), and Pharmacist who all work together to provide you with the care you need, when you need it.   You may see any of the following providers on your designated Care Team at your next follow up:  Dr. Glori Bickers Dr. Loralie Champagne Dr. Roxana Hires, NP Lyda Jester, [place] [facility] [place], [place] Forestine Na, NP Audry Riles, PharmD   Please be sure to bring in all your medications bottles to every appointment.   Need to Contact  us:  If you have any questions or concerns before your next appointment please send [place] a message through [place] or call our office at [phone].    TO LEAVE A MESSAGE FOR THE NURSE SELECT OPTION 2, PLEASE LEAVE A MESSAGE INCLUDING: YOUR NAME DATE OF BIRTH CALL BACK NUMBER REASON FOR CALL**this is important as we prioritize the call backs  YOU WILL RECEIVE A CALL BACK THE SAME DAY AS LONG AS YOU CALL BEFORE 4:00 PM

## 2021-10-13 NOTE — Addendum Note (Signed)
Encounter addended by: Jerl Mina, RN on: 10/13/2021 11:26 AM  Actions taken: Order list changed, Diagnosis association updated, Charge Capture section accepted, Pend clinical note, Clinical Note Signed

## 2021-10-13 NOTE — Progress Notes (Signed)
Advanced Heart Failure Clinic Note    PCP: Teena Dunk, NP HF Cardiologist: Dr. Haroldine Laws  HPI: Caleb Landry is a 51 y.o. male with history of asthma, tobacco abuse, and chronic systolic heart failure.  Admitted 7/22 for acute CHF exacerbation and started on lasix 40 mg IV bid. Echo obtained showing LVEF ~20% with global HK, G3DD, prominent LV. Cardiology and AHF team consulted to assist with management. Ultimately down 30 lbs after large volume diuresis. R/LHC: normal cors, severe NICM EF 20-25%, low filling pressures with moderately reduced CO. He was started on GDMT. SPEP negative. cMRI: LVEF 13%, RVEF 21%, nonspecific RV insertion LGE, mild MR, mild TR. Suspect NICM possibly due to OTC stimulants (he does not use meth). Hospitalization complicated by lower extremity swelling w/ concern for cellulitis; treated with doxycycline. His discharge weight 165 was lbs.   Last seen for f/u 01/23. Volume looked good. Entesto increased. Furosemide switched to PRN.  Echo 01/23: EF 40-45%, RV okay  Here for f/u. Doing ok. Denies CP or SOB. No edema, orthopnea or PND. Has been out of Jardiance for one week. Having problems with tingling in arms and legs. Smoking < 0.5 ppd. Still using stimulants  CARDIAC STUDIES:  - Echo 01/23: EF 40-45%, RV okay  - cMRI (7/22): LVEF 13%, RV severe systolic dysfunction [id number], mild MR/TR, RV insertion site LGE  - Echo (07/16/20): EF < 20, moderately dilated LV, mild LVH, enlarged RV with moderately reduced RV function, RVSP 54 mmHg, severe LAE, mild to moderate MR/TR  - R/LHC (07/19/20): normal cors, severe NICM EF 20-25%, low filling pressures w/ mod reduced CO   Ao = 96/70 (81) LV = 90/8 RA =  1 RV = 29/2 PA = 29/8 (15) PCW = 5 Fick cardiac output/index = 4.1/2.1 PVR = 2.5 SVR = 1572 Ao sat = 96% PA sat = 62%, 63%  Past Medical History:  Diagnosis Date   Asthma    Neuropathy    Current Outpatient Medications  Medication Sig Dispense Refill    albuterol (PROVENTIL) (2.5 MG/3ML) 0.083% nebulizer solution Take 3 mLs (2.5 mg total) by nebulization every 6 (six) hours as needed for wheezing or shortness of breath. 150 mL 1   albuterol (VENTOLIN HFA) 108 (90 Base) MCG/ACT inhaler INHALE 2 PUFFS INTO THE LUNGS EVERY 6 HOURS AS NEEDED FOR WHEEZING OR SHORTNESS OF BREATH 8.5 g 2   bisoprolol (ZEBETA) 5 MG tablet Take 0.5 tablets (2.5 mg total) by mouth daily. 34 tablet 1   furosemide (LASIX) 40 MG tablet Take 1 tablet (40 mg total) by mouth daily as needed. 30 tablet 6   gabapentin (NEURONTIN) 300 MG capsule Take 2 capsules (600 mg total) by mouth 2 (two) times daily. 120 capsule 5   potassium chloride SA (KLOR-CON M) 20 MEQ tablet Take 1 tablet (20 mEq total) by mouth daily as needed. Take when you take your lasix 30 tablet 2   sacubitril-valsartan (ENTRESTO) 97-103 MG Take 1 tablet by mouth 2 (two) times daily. 180 tablet 3   spironolactone (ALDACTONE) 25 MG tablet Take 1 tablet (25 mg total) by mouth daily. 90 tablet 3   empagliflozin (JARDIANCE) 10 MG TABS tablet Take 1 tablet (10 mg total) by mouth daily. (Patient not taking: Reported on 10/13/2021) 30 tablet 1   No current facility-administered medications for this encounter.   Allergies  Allergen Reactions   Penicillins Hives   Social History   Socioeconomic History   Marital status: Widowed  Spouse name: Not on file   Number of children: Not on file   Years of education: Not on file   Highest education level: Not on file  Occupational History   Not on file  Tobacco Use   Smoking status: Every Day    Packs/day: 0.50    Years: 30.00    Total pack years: 15.00    Types: Cigarettes    Last attempt to quit: 08/13/2020    Years since quitting: 1.1    Passive exposure: Current   Smokeless tobacco: Never  Vaping Use   Vaping Use: Never used  Substance and Sexual Activity   Alcohol use: Yes    Comment: rarely   Drug use: Never   Sexual activity: Yes  Other Topics Concern    Not on file  Social History Narrative   Not on file   Social Determinants of Health   Financial Resource Strain: Medium Risk (04/29/2021)   Overall Financial Resource Strain (CARDIA)    Difficulty of Paying Living Expenses: Somewhat hard  Food Insecurity: No Food Insecurity (04/29/2021)   Hunger Vital Sign    Worried About Running Out of Food in the Last Year: Never true    Ran Out of Food in the Last Year: Never true  Transportation Needs: Unmet Transportation Needs (04/29/2021)   PRAPARE - Hydrologist (Medical): Yes    Lack of Transportation (Non-Medical): Yes  Physical Activity: Not on file  Stress: Not on file  Social Connections: Not on file  Intimate Partner Violence: Not on file   Family History  Problem Relation Age of Onset   Crohn's disease Mother    BP 100/60   Pulse 76   Wt 82.6 kg (182 lb 3.2 oz)   SpO2 98%   BMI 26.14 kg/m   Wt Readings from Last 3 Encounters:  10/13/21 82.6 kg (182 lb 3.2 oz)  08/10/21 83.9 kg (185 lb)  08/01/21 81.6 kg (180 lb)   PHYSICAL EXAM: General:  Well appearing. No resp difficulty HEENT: normal Neck: supple. no JVD. Carotids 2+ bilat; no bruits. No lymphadenopathy or thryomegaly appreciated. Cor: Barrel chested . Regular rate & rhythm. No rubs, gallops or murmurs. Lungs: clear decreased throughout Abdomen: soft, nontender, nondistended. No hepatosplenomegaly. No bruits or masses. Good bowel sounds. Extremities: no cyanosis, clubbing, rash, edema severe varicose veins ? Possible psoriatic plaques Neuro: alert & orientedx3, cranial nerves grossly intact. moves all 4 extremities w/o difficulty. Affect pleasant  NSR 72 1st AVB (231ms) No ST-T wave abnormalities. Personally reviewed   ASSESSMENT & PLAN:  1. Chronic Systolic HF - Echo (A999333): EF < 20% RV moderately down mild to mod MR/TR.  - Cath with normal coronaries, low filling pressures and reduced cardiac output. Cardiac MRI pending. -  Suspect NICM possibly due to OTC stimulants (he does not use meth). There is some hyper trabeculation in the LV cavity but not felt to be consistent with LV noncompaction.  - SPEP negative. - cMRI (7/22): EF 13%, RVEF 21%, mild MR/TR, nonspecific RV insertion LGE - Echo 01/23: EF 40-45% - NYHA II Volume looks good. Off lasix  - Continue Entresto  97/103 mg bid. Entresto thru [employer]. - Continue bisoprolol 2.5 mg daily (h/o asthma). - Continue spiro 25 mg daily. - Continue Jardiance 10 mg daily. Jardiance through [employer]. (Has been out for a week) -> will d/w PharmD to restart - Will not titrate GDMT with low BP - Labs today - F/u 4  months with echo   2. Tobacco use - Previously quit, currently smoking < 10 cigarettes daily - Encouraged complete  cessation.     Glori Bickers, MD 10/13/21

## 2021-10-14 ENCOUNTER — Telehealth (HOSPITAL_COMMUNITY): Payer: Self-pay | Admitting: Pharmacy Technician

## 2021-10-14 ENCOUNTER — Other Ambulatory Visit (HOSPITAL_COMMUNITY): Payer: Self-pay

## 2021-10-14 NOTE — Telephone Encounter (Signed)
Advanced Heart Failure Patient Advocate Encounter  Patient called in stating he was not able to get the co-pay card information. Says he was told that if he has Medicaid, he does not qualify for the card. Although this is true, the patient does not in fact have medicaid.  Activated and emailed co-pay card information via email.  Charlann Boxer, CPhT

## 2021-10-28 ENCOUNTER — Encounter: Payer: Self-pay | Admitting: Podiatry

## 2021-10-28 ENCOUNTER — Ambulatory Visit (INDEPENDENT_AMBULATORY_CARE_PROVIDER_SITE_OTHER): Payer: Commercial Managed Care - HMO | Admitting: Podiatry

## 2021-10-28 ENCOUNTER — Ambulatory Visit (INDEPENDENT_AMBULATORY_CARE_PROVIDER_SITE_OTHER): Payer: Commercial Managed Care - HMO

## 2021-10-28 DIAGNOSIS — M2042 Other hammer toe(s) (acquired), left foot: Secondary | ICD-10-CM

## 2021-10-28 DIAGNOSIS — Z9889 Other specified postprocedural states: Secondary | ICD-10-CM

## 2021-10-31 NOTE — Progress Notes (Signed)
Subjective: Chief Complaint  Patient presents with   Routine Post Op    DOS 08/27/21  s/p left foot hammertoe repair toes 1-5  "Its doing good"   51 year old male presents the above concerns.  States that he is doing well and is back to her regular shoe.  He has no significant pain.  there is no reports.  he has no other concerns  Objective: AAO x 3, NAD, presents wearing CAM boot.  DP/PT pulses palpable 2/4, CRT < 3 sec to all digits.  Protective sensation intact. Motor function intact.  Left foot: Incision is well coapted without any evidence of dehiscence and scars have formed. There is still some mild residual edema with no erythema or warmth.  There is no pain on exam.  There is no open lesions. On the right foot there is no pain.  Hardware of the hallux is not palpable and the edema appears improved.  No pain with calf compression, swelling, warmth, erythema.   Assessment and Plan:  Status post left foot hammertoe repair toes 1-5, doing well with no complications   -Treatment options discussed including all alternatives, risks, and complications -X-rays obtained and reviewed.  3 views of the foot were obtained.  There is no evidence of acute fracture.  Hardware intact.  No complicating factors noted. -At this time he is doing well.  Recommended regular shoe.  Discussed continue ice, elevate the other day to help with any residual edema.  He is not having any pain.  Because of the similar discharge in the postoperative course and agrees this plan however I discussed with him there is any changes or any concerns to let me know.  Trula Slade DPM

## 2021-11-17 ENCOUNTER — Other Ambulatory Visit: Payer: Self-pay | Admitting: Nurse Practitioner

## 2021-11-30 ENCOUNTER — Other Ambulatory Visit: Payer: Self-pay | Admitting: Internal Medicine

## 2021-12-06 ENCOUNTER — Telehealth: Payer: Self-pay | Admitting: Nurse Practitioner

## 2021-12-06 NOTE — Telephone Encounter (Signed)
Caller & Relationship to patient:  MRN #  [id number]   Call Back Number:   Date of Last Office Visit: 08/01/2021     Date of Next Office Visit: 02/01/2022    Medication(s) to be Refilled: Albuterol inhaler  Preferred Pharmacy:   ** Please notify patient to allow 48-72 hours to process** **Let patient know to contact pharmacy at the end of the day to make sure medication is ready. ** **If patient has not been seen in a year or longer, book an appointment **Advise to use MyChart for refill requests OR to contact their pharmacy

## 2021-12-07 ENCOUNTER — Other Ambulatory Visit: Payer: Self-pay

## 2021-12-07 DIAGNOSIS — J4520 Mild intermittent asthma, uncomplicated: Secondary | ICD-10-CM

## 2021-12-07 MED ORDER — ALBUTEROL SULFATE HFA 108 (90 BASE) MCG/ACT IN AERS: 2.0000 | INHALATION_SPRAY | Freq: Four times a day (QID) | RESPIRATORY_TRACT | 2 refills | Status: DC | PRN

## 2021-12-07 NOTE — Telephone Encounter (Signed)
Med was sent in. Kh

## 2021-12-22 ENCOUNTER — Other Ambulatory Visit: Payer: Self-pay

## 2022-01-18 ENCOUNTER — Encounter: Payer: Self-pay | Admitting: Nurse Practitioner

## 2022-01-18 ENCOUNTER — Ambulatory Visit (INDEPENDENT_AMBULATORY_CARE_PROVIDER_SITE_OTHER): Payer: BLUE CROSS/BLUE SHIELD | Admitting: Nurse Practitioner

## 2022-01-18 VITALS — BP 108/61 | HR 71 | Temp 98.0°F | Ht 70.0 in | Wt 190.6 lb

## 2022-01-18 DIAGNOSIS — J4520 Mild intermittent asthma, uncomplicated: Secondary | ICD-10-CM

## 2022-01-18 DIAGNOSIS — Z1329 Encounter for screening for other suspected endocrine disorder: Secondary | ICD-10-CM

## 2022-01-18 DIAGNOSIS — L409 Psoriasis, unspecified: Secondary | ICD-10-CM | POA: Diagnosis not present

## 2022-01-18 DIAGNOSIS — Z113 Encounter for screening for infections with a predominantly sexual mode of transmission: Secondary | ICD-10-CM

## 2022-01-18 DIAGNOSIS — G629 Polyneuropathy, unspecified: Secondary | ICD-10-CM | POA: Diagnosis not present

## 2022-01-18 DIAGNOSIS — Z1211 Encounter for screening for malignant neoplasm of colon: Secondary | ICD-10-CM

## 2022-01-18 DIAGNOSIS — Z9189 Other specified personal risk factors, not elsewhere classified: Secondary | ICD-10-CM

## 2022-01-18 DIAGNOSIS — R7301 Impaired fasting glucose: Secondary | ICD-10-CM

## 2022-01-18 DIAGNOSIS — Z0000 Encounter for general adult medical examination without abnormal findings: Secondary | ICD-10-CM

## 2022-01-18 DIAGNOSIS — Z125 Encounter for screening for malignant neoplasm of prostate: Secondary | ICD-10-CM

## 2022-01-18 DIAGNOSIS — Z13220 Encounter for screening for lipoid disorders: Secondary | ICD-10-CM

## 2022-01-18 MED ORDER — DAPAGLIFLOZIN PROPANEDIOL 10 MG PO TABS: 10.0000 mg | ORAL_TABLET | Freq: Every day | ORAL | 3 refills | Status: DC

## 2022-01-18 MED ORDER — CETIRIZINE HCL 10 MG PO TABS: 10.0000 mg | ORAL_TABLET | Freq: Every day | ORAL | 0 refills | Status: DC

## 2022-01-18 MED ORDER — BISOPROLOL FUMARATE 5 MG PO TABS: ORAL_TABLET | ORAL | 3 refills | Status: DC

## 2022-01-18 MED ORDER — ALBUTEROL SULFATE (2.5 MG/3ML) 0.083% IN NEBU: 2.5000 mg | INHALATION_SOLUTION | Freq: Four times a day (QID) | RESPIRATORY_TRACT | 1 refills | Status: DC | PRN

## 2022-01-18 MED ORDER — FLUTICASONE PROPIONATE 50 MCG/ACT NA SUSP: 2.0000 | Freq: Every day | NASAL | 6 refills | Status: DC

## 2022-01-18 MED ORDER — GABAPENTIN 300 MG PO CAPS: 600.0000 mg | ORAL_CAPSULE | Freq: Two times a day (BID) | ORAL | 5 refills | Status: DC

## 2022-01-18 MED ORDER — SPIRONOLACTONE 25 MG PO TABS: 25.0000 mg | ORAL_TABLET | Freq: Every day | ORAL | 3 refills | Status: DC

## 2022-01-18 MED ORDER — POTASSIUM CHLORIDE CRYS ER 20 MEQ PO TBCR: 20.0000 meq | EXTENDED_RELEASE_TABLET | Freq: Every day | ORAL | 2 refills | Status: DC | PRN

## 2022-01-18 MED ORDER — ENTRESTO 97-103 MG PO TABS: 1.0000 | ORAL_TABLET | Freq: Two times a day (BID) | ORAL | 3 refills | Status: DC

## 2022-01-18 MED ORDER — FUROSEMIDE 40 MG PO TABS: 40.0000 mg | ORAL_TABLET | Freq: Every day | ORAL | 6 refills | Status: AC | PRN

## 2022-01-18 MED ORDER — ALBUTEROL SULFATE HFA 108 (90 BASE) MCG/ACT IN AERS: 2.0000 | INHALATION_SPRAY | Freq: Four times a day (QID) | RESPIRATORY_TRACT | 2 refills | Status: DC | PRN

## 2022-01-18 MED ORDER — TRIAMCINOLONE ACETONIDE 0.1 % EX CREA: 1.0000 | TOPICAL_CREAM | Freq: Two times a day (BID) | CUTANEOUS | 0 refills | Status: DC

## 2022-01-18 NOTE — Patient Instructions (Signed)
1. At risk for sexually transmitted disease due to partner with multiple partners  - Ambulatory referral to Infectious Disease  2. Mild intermittent asthma without complication  - albuterol (PROVENTIL) (2.5 MG/3ML) 0.083% nebulizer solution; Take 3 mLs (2.5 mg total) by nebulization every 6 (six) hours as needed for wheezing or shortness of breath.  Dispense: 150 mL; Refill: 1 - albuterol (VENTOLIN HFA) 108 (90 Base) MCG/ACT inhaler; Inhale 2 puffs into the lungs every 6 (six) hours as needed for wheezing or shortness of breath.  Dispense: 8.5 g; Refill: 2  3. Neuropathy  - gabapentin (NEURONTIN) 300 MG capsule; Take 2 capsules (600 mg total) by mouth 2 (two) times daily.  Dispense: 120 capsule; Refill: 5  4. Psoriasis  - Ambulatory referral to Dermatology  5. Colon cancer screening  - Cologuard  6. Impaired fasting glucose  - POCT glycosylated hemoglobin (Hb A1C) - Microalbumin/Creatinine Ratio, Urine  7. Screen for STD (sexually transmitted disease)  - RPR+HIV+GC+CT Panel  8. Routine health maintenance  - CBC - Comprehensive metabolic panel - PSA - Lipid Panel - TSH  9. Lipid screening  - Lipid Panel  10. Prostate cancer screening  - PSA  11. Thyroid disorder screen  - TSH  Follow up:  Follow up in 6 months

## 2022-01-18 NOTE — Progress Notes (Signed)
Complete physical exam  Patient: Caleb Landry   DOB: 04-Dec-1970   51 y.o. Male  MRN: [id number]  Subjective:    Chief Complaint  Patient presents with   Annual Exam    Fasting     Caleb Landry is a 52 y.o. male who presents today for a complete physical exam. He reports consuming a low fat diet.  Staying act 20 min qd.  He generally feels fairly well. He reports sleeping fairly well. He does have additional problems to discuss today.   Patient complains today of weight gain.  He did state that he had to take extra Lasix this week.  He does need a follow-up appointment scheduled with cardiology.  We we will send a message to cardiology office to see if they can get that scheduled for him.  He does have a history of congestive heart failure.  Patient does continue to have ongoing rash to his legs.  We will order Kenalog cream and refer him to dermatology for further evaluation and treatment.  Patient also complains of ongoing sinus congestion.  We will order Flonase nasal spray.  Patient is requesting referral to infectious disease for active treatment for HIV.  Patient states that he is at high risk with sexual behavior.  Denies f/c/s, n/v/d, hemoptysis, PND, leg swelling Denies chest pain or edema      Most recent fall risk assessment:    08/01/2021   10:38 AM  Fall Risk   Falls in the past year? 0  Number falls in past yr: 0  Injury with Fall? 0  Risk for fall due to : No Fall Risks     Most recent depression screenings:    01/18/2022   11:06 AM 08/01/2021   10:38 AM  PHQ 2/9 Scores  PHQ - 2 Score 0 0  PHQ- 9 Score  1      Patient Active Problem List   Diagnosis Date Noted   Varicose vein of leg 08/10/2021   Neuropathy 08/01/2021   Chronic systolic heart failure (HCC) 01/31/2021   Chronic diastolic heart failure (HCC) 08/03/2020   Acute congestive heart failure (HCC) 07/16/2020   Tobacco abuse 07/16/2020   Asthma 07/16/2020   Acute CHF (congestive heart  failure) (HCC) 07/16/2020   Hammer toes of both feet 01/03/2019   Past Medical History:  Diagnosis Date   Asthma    Neuropathy    Social History   Tobacco Use   Smoking status: Every Day    Packs/day: 0.50    Years: 30.00    Total pack years: 15.00    Types: Cigarettes    Last attempt to quit: 08/13/2020    Years since quitting: 1.4    Passive exposure: Current   Smokeless tobacco: Never  Vaping Use   Vaping Use: Never used  Substance Use Topics   Alcohol use: Yes    Comment: rarely   Drug use: Never   Allergies  Allergen Reactions   Penicillins Hives      Patient Care Team: Ivonne Andrew, NP as PCP - General (Pulmonary Disease)   Outpatient Medications Prior to Visit  Medication Sig   [DISCONTINUED] albuterol (PROVENTIL) (2.5 MG/3ML) 0.083% nebulizer solution Take 3 mLs (2.5 mg total) by nebulization every 6 (six) hours as needed for wheezing or shortness of breath.   [DISCONTINUED] albuterol (VENTOLIN HFA) 108 (90 Base) MCG/ACT inhaler Inhale 2 puffs into the lungs every 6 (six) hours as needed for wheezing or shortness of breath.   [  DISCONTINUED] bisoprolol (ZEBETA) 5 MG tablet TAKE 1/2 TABLET(2.5 MG) BY MOUTH DAILY   [DISCONTINUED] cetirizine (ZYRTEC) 10 MG tablet Take 10 mg by mouth daily.   [DISCONTINUED] dapagliflozin propanediol (FARXIGA) 10 MG TABS tablet Take 1 tablet (10 mg total) by mouth daily before breakfast.   [DISCONTINUED] furosemide (LASIX) 40 MG tablet Take 1 tablet (40 mg total) by mouth daily as needed.   [DISCONTINUED] gabapentin (NEURONTIN) 300 MG capsule Take 2 capsules (600 mg total) by mouth 2 (two) times daily.   [DISCONTINUED] potassium chloride SA (KLOR-CON M) 20 MEQ tablet Take 1 tablet (20 mEq total) by mouth daily as needed. Take when you take your lasix   [DISCONTINUED] sacubitril-valsartan (ENTRESTO) 97-103 MG Take 1 tablet by mouth 2 (two) times daily.   [DISCONTINUED] spironolactone (ALDACTONE) 25 MG tablet Take 1 tablet (25 mg  total) by mouth daily.   No facility-administered medications prior to visit.    Review of Systems  Constitutional: Negative.   HENT: Negative.    Eyes: Negative.   Respiratory: Negative.    Cardiovascular: Negative.   Gastrointestinal: Negative.   Genitourinary: Negative.   Musculoskeletal: Negative.   Skin: Negative.   Neurological: Negative.   Endo/Heme/Allergies: Negative.   Psychiatric/Behavioral: Negative.            Objective:     BP 108/61   Pulse 71   Temp 98 F (36.7 C)   Ht 5\' 10"  (1.778 m)   Wt 190 lb 9.6 oz (86.5 kg)   SpO2 96%   BMI 27.35 kg/m  BP Readings from Last 3 Encounters:  01/18/22 108/61  10/13/21 100/60  08/10/21 113/74      Physical Exam Vitals and nursing note reviewed.  Constitutional:      General: He is not in acute distress.    Appearance: He is well-developed.  Cardiovascular:     Rate and Rhythm: Normal rate and regular rhythm.  Pulmonary:     Effort: Pulmonary effort is normal.     Breath sounds: Normal breath sounds.  Skin:    General: Skin is warm and dry.  Neurological:     Mental Status: He is alert and oriented to person, place, and time.      No results found for any visits on 01/18/22. Last CBC Lab Results  Component Value Date   WBC 13.8 (H) 06/14/2021   HGB 12.0 (L) 06/14/2021   HCT 35.6 (L) 06/14/2021   MCV 89.7 06/14/2021   MCH 30.2 06/14/2021   RDW 13.7 06/14/2021   PLT 229 [id number]   Last metabolic panel Lab Results  Component Value Date   GLUCOSE 89 10/13/2021   NA 138 10/13/2021   K 3.8 10/13/2021   CL 104 10/13/2021   CO2 28 10/13/2021   BUN 30 (H) 10/13/2021   CREATININE 1.21 10/13/2021   GFRNONAA >60 10/13/2021   CALCIUM 8.9 10/13/2021   PHOS 4.4 07/20/2020   PROT 6.6 10/13/2021   ALBUMIN 3.9 10/13/2021   LABGLOB 2.5 09/13/2020   AGRATIO 1.8 09/13/2020   BILITOT 0.7 10/13/2021   ALKPHOS 76 10/13/2021   AST 21 10/13/2021   ALT 15 10/13/2021   ANIONGAP 6 10/13/2021   Last  lipids Lab Results  Component Value Date   CHOL 162 09/13/2020   HDL 54 09/13/2020   LDLCALC 92 09/13/2020   TRIG 87 09/13/2020   CHOLHDL 3.0 09/13/2020   Last hemoglobin A1c Lab Results  Component Value Date   HGBA1C 5.5 02/21/2021   Last thyroid  functions Lab Results  Component Value Date   TSH 1.630 07/17/2020   Last vitamin D No results found for: "25OHVITD2", "25OHVITD3", "VD25OH"      Assessment & Plan:    Routine Health Maintenance and Physical Exam  Immunization History  Administered Date(s) Administered   Covid-19, Mrna,Vaccine(Spikevax)19yrs and older 11/04/2021   Influenza-Unspecified 10/17/2021   PNEUMOCOCCAL CONJUGATE-20 10/17/2021   Tdap 11/18/2021   Zoster Recombinat (Shingrix) 01/06/2022    Health Maintenance  Topic Date Due   Diabetic kidney evaluation - Urine ACR  Never done   Hepatitis C Screening  Never done   Fecal DNA (Cologuard)  Never done   Zoster Vaccines- Shingrix (2 of 2) 03/03/2022   Diabetic kidney evaluation - eGFR measurement  10/14/2022   DTaP/Tdap/Td (2 - Td or Tdap) 11/19/2031   INFLUENZA VACCINE  Completed   COVID-19 Vaccine  Completed   HIV Screening  Completed   HPV VACCINES  Aged Out    Discussed health benefits of physical activity, and encouraged him to engage in regular exercise appropriate for his age and condition.  Problem List Items Addressed This Visit       Respiratory   Asthma   Relevant Medications   albuterol (PROVENTIL) (2.5 MG/3ML) 0.083% nebulizer solution   albuterol (VENTOLIN HFA) 108 (90 Base) MCG/ACT inhaler     Nervous and Auditory   Neuropathy   Relevant Medications   gabapentin (NEURONTIN) 300 MG capsule   Other Visit Diagnoses     At risk for sexually transmitted disease due to partner with multiple partners    -  Primary   Relevant Orders   Ambulatory referral to Infectious Disease   Psoriasis       Relevant Orders   Ambulatory referral to Dermatology   Colon cancer screening        Relevant Orders   Cologuard   Impaired fasting glucose       Relevant Orders   Microalbumin/Creatinine Ratio, Urine   Hemoglobin A1c   Screen for STD (sexually transmitted disease)       Relevant Orders   RPR+HIV+GC+CT Panel   Routine health maintenance       Relevant Orders   CBC   Comprehensive metabolic panel   PSA   Lipid Panel   TSH   Lipid screening       Relevant Orders   Lipid Panel   Prostate cancer screening       Relevant Orders   PSA   Thyroid disorder screen       Relevant Orders   TSH      Return in about 6 months (around 07/19/2022).  1. At risk for sexually transmitted disease due to partner with multiple partners  - Ambulatory referral to Infectious Disease  2. Mild intermittent asthma without complication  - albuterol (PROVENTIL) (2.5 MG/3ML) 0.083% nebulizer solution; Take 3 mLs (2.5 mg total) by nebulization every 6 (six) hours as needed for wheezing or shortness of breath.  Dispense: 150 mL; Refill: 1 - albuterol (VENTOLIN HFA) 108 (90 Base) MCG/ACT inhaler; Inhale 2 puffs into the lungs every 6 (six) hours as needed for wheezing or shortness of breath.  Dispense: 8.5 g; Refill: 2  3. Neuropathy  - gabapentin (NEURONTIN) 300 MG capsule; Take 2 capsules (600 mg total) by mouth 2 (two) times daily.  Dispense: 120 capsule; Refill: 5  4. Psoriasis  - Ambulatory referral to Dermatology  5. Colon cancer screening  - Cologuard  6. Impaired fasting glucose  -  POCT glycosylated hemoglobin (Hb A1C) - Microalbumin/Creatinine Ratio, Urine  7. Screen for STD (sexually transmitted disease)  - RPR+HIV+GC+CT Panel  8. Routine health maintenance  - CBC - Comprehensive metabolic panel - PSA - Lipid Panel - TSH  9. Lipid screening  - Lipid Panel  10. Prostate cancer screening  - PSA  11. Thyroid disorder screen  - TSH  Follow up:  Follow up in 6 months      Fenton Foy, NP

## 2022-01-19 ENCOUNTER — Other Ambulatory Visit: Payer: Self-pay

## 2022-01-19 ENCOUNTER — Other Ambulatory Visit (HOSPITAL_COMMUNITY): Payer: Self-pay

## 2022-01-19 ENCOUNTER — Ambulatory Visit (INDEPENDENT_AMBULATORY_CARE_PROVIDER_SITE_OTHER): Payer: BLUE CROSS/BLUE SHIELD | Admitting: Pharmacist

## 2022-01-19 DIAGNOSIS — Z79899 Other long term (current) drug therapy: Secondary | ICD-10-CM | POA: Diagnosis not present

## 2022-01-19 LAB — HEMOGLOBIN A1C
Est. average glucose Bld gHb Est-mCnc: 123 mg/dL
Hgb A1c MFr Bld: 5.9 % — ABNORMAL HIGH (ref 4.8–5.6)

## 2022-01-19 LAB — MICROALBUMIN / CREATININE URINE RATIO
Creatinine, Urine: 55.1 mg/dL
Microalb/Creat Ratio: <5 mg/g creat (ref 0–29)
Microalbumin, Urine: <3 ug/mL

## 2022-01-19 LAB — LIPID PANEL
Chol/HDL Ratio: 3.6 ratio (ref 0.0–5.0)
Cholesterol, Total: 196 mg/dL (ref 100–199)
HDL: 55 mg/dL (ref >39)
LDL Chol Calc (NIH): 127 mg/dL — ABNORMAL HIGH (ref 0–99)
Triglycerides: 75 mg/dL (ref 0–149)
VLDL Cholesterol Cal: 14 mg/dL (ref 5–40)

## 2022-01-19 LAB — COMPREHENSIVE METABOLIC PANEL
ALT: 15 IU/L (ref 0–44)
AST: 17 IU/L (ref 0–40)
Albumin/Globulin Ratio: 1.7 (ref 1.2–2.2)
Albumin: 4.7 g/dL (ref 4.1–5.1)
Alkaline Phosphatase: 103 IU/L (ref 44–121)
BUN/Creatinine Ratio: 22 — ABNORMAL HIGH (ref 9–20)
BUN: 22 mg/dL (ref 6–24)
Bilirubin Total: 0.3 mg/dL (ref 0.0–1.2)
CO2: 27 mmol/L (ref 20–29)
Calcium: 9.7 mg/dL (ref 8.7–10.2)
Chloride: 97 mmol/L (ref 96–106)
Creatinine, Ser: 0.99 mg/dL (ref 0.76–1.27)
Globulin, Total: 2.8 g/dL (ref 1.5–4.5)
Glucose: 97 mg/dL (ref 70–99)
Potassium: 5 mmol/L (ref 3.5–5.2)
Sodium: 137 mmol/L (ref 134–144)
Total Protein: 7.5 g/dL (ref 6.0–8.5)
eGFR: 93 mL/min/{1.73_m2} (ref >59)

## 2022-01-19 LAB — CBC
Hematocrit: 37.2 % — ABNORMAL LOW (ref 37.5–51.0)
Hemoglobin: 12.6 g/dL — ABNORMAL LOW (ref 13.0–17.7)
MCH: 30.6 pg (ref 26.6–33.0)
MCHC: 33.9 g/dL (ref 31.5–35.7)
MCV: 90 fL (ref 79–97)
Platelets: 369 10*3/uL (ref 150–450)
RBC: 4.12 x10E6/uL — ABNORMAL LOW (ref 4.14–5.80)
RDW: 12.6 % (ref 11.6–15.4)
WBC: 8.4 10*3/uL (ref 3.4–10.8)

## 2022-01-19 LAB — PSA: Prostate Specific Ag, Serum: 0.1 ng/mL (ref 0.0–4.0)

## 2022-01-19 LAB — TSH: TSH: 1.23 u[IU]/mL (ref 0.450–4.500)

## 2022-01-19 MED ORDER — DESCOVY 200-25 MG PO TABS: 1.0000 | ORAL_TABLET | Freq: Every day | ORAL | 2 refills | Status: DC

## 2022-01-19 NOTE — Patient Instructions (Signed)
u 

## 2022-01-19 NOTE — Progress Notes (Signed)
NEW REFERRAL TO CPP CLINIC    Date:  01/19/2022   HPI: Caleb Landry is a 52 y.o. male who presents to the [facility] to discuss and initiate PrEP.  Insured   [x]    Uninsured  []    Patient Active Problem List   Diagnosis Date Noted   Varicose vein of leg 08/10/2021   Neuropathy 16/10/9602   Chronic systolic heart failure (HCC) 01/31/2021   Chronic diastolic heart failure ([place]) 08/03/2020   Acute congestive heart failure ([place]) 07/16/2020   Tobacco abuse 07/16/2020   Asthma 07/16/2020   Acute CHF (congestive heart failure) ([place]) 07/16/2020   Hammer toes of both feet 01/03/2019    Patient's Medications  New Prescriptions   EMTRICITABINE-TENOFOVIR AF (DESCOVY) 200-25 MG TABLET    Take 1 tablet by mouth daily.  Previous Medications   ALBUTEROL (PROVENTIL) (2.5 MG/3ML) 0.083% NEBULIZER SOLUTION    Take 3 mLs (2.5 mg total) by nebulization every 6 (six) hours as needed for wheezing or shortness of breath.   ALBUTEROL (VENTOLIN HFA) 108 (90 BASE) MCG/ACT INHALER    Inhale 2 puffs into the lungs every 6 (six) hours as needed for wheezing or shortness of breath.   BISOPROLOL (ZEBETA) 5 MG TABLET    TAKE 1/2 TABLET(2.5 MG) BY MOUTH DAILY   CETIRIZINE (ZYRTEC) 10 MG TABLET    Take 1 tablet (10 mg total) by mouth daily.   DAPAGLIFLOZIN PROPANEDIOL (FARXIGA) 10 MG TABS TABLET    Take 1 tablet (10 mg total) by mouth daily before breakfast.   FLUTICASONE (FLONASE) 50 MCG/ACT NASAL SPRAY    Place 2 sprays into both nostrils daily.   FUROSEMIDE (LASIX) 40 MG TABLET    Take 1 tablet (40 mg total) by mouth daily as needed.   GABAPENTIN (NEURONTIN) 300 MG CAPSULE    Take 2 capsules (600 mg total) by mouth 2 (two) times daily.   POTASSIUM CHLORIDE SA (KLOR-CON M) 20 MEQ TABLET    Take 1 tablet (20 mEq total) by mouth daily as needed. Take when you take your lasix   SACUBITRIL-VALSARTAN (ENTRESTO) 97-103 MG    Take 1 tablet by mouth 2 (two) times daily.   SPIRONOLACTONE (ALDACTONE) 25 MG TABLET     Take 1 tablet (25 mg total) by mouth daily.   TRIAMCINOLONE CREAM (KENALOG) 0.1 %    Apply 1 Application topically 2 (two) times daily.  Modified Medications   No medications on file  Discontinued Medications   No medications on file    Allergies: Allergies  Allergen Reactions   Penicillins Hives    Past Medical History: Past Medical History:  Diagnosis Date   Asthma    Neuropathy     Social History: Social History   Socioeconomic History   Marital status: Widowed    Spouse name: Not on file   Number of children: Not on file   Years of education: Not on file   Highest education level: Not on file  Occupational History   Not on file  Tobacco Use   Smoking status: Every Day    Packs/day: 0.50    Years: 30.00    Total pack years: 15.00    Types: Cigarettes    Last attempt to quit: 08/13/2020    Years since quitting: 1.4    Passive exposure: Current   Smokeless tobacco: Never  Vaping Use   Vaping Use: Never used  Substance and Sexual Activity   Alcohol use: Yes    Comment: rarely  Drug use: Never   Sexual activity: Yes  Other Topics Concern   Not on file  Social History Narrative   Not on file   Social Determinants of Health   Financial Resource Strain: Medium Risk (04/29/2021)   Overall Financial Resource Strain (CARDIA)    Difficulty of Paying Living Expenses: Somewhat hard  Food Insecurity: No Food Insecurity (04/29/2021)   Hunger Vital Sign    Worried About Running Out of Food in the Last Year: Never true    Ran Out of Food in the Last Year: Never true  Transportation Needs: Unmet Transportation Needs (04/29/2021)   PRAPARE - Transportation    Lack of Transportation (Medical): Yes    Lack of Transportation (Non-Medical): Yes  Physical Activity: Not on file  Stress: Not on file  Social Connections: Not on file       01/19/2022    2:47 PM 01/19/2022    2:00 PM  CHL HIV PREP FLOWSHEET RESULTS  Insurance Status  Insured  How did you hear?   referred from PCP  Gender at birth  Male  Gender identity  cis-Male  Risk for HIV Condomless vaginal or anal intercourse;>5 partners in past 6 mos (regardless of condom use) In sexual relationship with HIV+ partner  Sex Partners  Men only  # sex partners past 3-6 mos  3  Sex activity preferences  Insertive and receptive;Oral  Condom use  Yes  % condom use  5  Treated for STI?  No  HIV symptoms?  None  PrEP Eligibility  Yes  Paper work received?  No    Labs:  SCr: Lab Results  Component Value Date   CREATININE 0.99 01/18/2022   CREATININE 1.21 10/13/2021   CREATININE 1.20 06/14/2021   CREATININE 0.91 05/17/2021   CREATININE 1.52 (H) 05/05/2021   HIV Lab Results  Component Value Date   HIV Non Reactive 01/18/2022   HIV Non Reactive 04/05/2021   HIV Non Reactive 07/16/2020   Hepatitis B No results found for: "HEPBSAB", "HEPBSAG", "HEPBCAB" Hepatitis C No results found for: "HEPCAB", "HCVRNAPCRQN" Hepatitis A No results found for: "HAV" RPR and STI Lab Results  Component Value Date   LABRPR Non Reactive 01/18/2022   LABRPR Non Reactive 04/05/2021    STI Results CT  01/18/2022 12:00 PM WILL FOLLOW  P  04/05/2021 10:48 AM Negative     P Preliminary result    Assessment: Caleb Landry presents to clinic today to initiate PrEP after being referred by his PCP. He states his wife passed away in 2018-04-20 and that he has since been sexually active with male partners only. He is typically the insertive partner but rarely will act as the receptive partner; he also participates in oral sex. He does not use condoms except for a rare occasion. Encouraged him to increase his condom use, but he states he prefers sex without them. He states he has been with ~10 partners in the last 1.5 years. Screened for acute HIV symptoms; HIV test was negative yesterday. PCP also checked RPR and gonorrhea/chlamydia urine cytology along with annual BMP and lipid panel. Will check hepatitis serologies today.    Discussed all of his PrEP options and answered several questions he had about PrEP/STIs. He would prefer Descovy at this time. It is covered at no charge through his insurance, and he would prefer to pick it up at the [facility] in [place], [place]. Counseled patient that Descovy is a one pill once daily regimen with or without food that  can prevent HIV. Discussed the importance of taking the medication daily to provide protection and decreased adherence is associated with decreased efficacy. Also discussed how Descovy works to prevent HIV but not other STDs and encouraged the use of condoms. Counseled on what to do if dose is missed, if closer to missed dose take immediately, if closer to next dose then skip and resume normal schedule.Counseled patient that Descovy is normally well tolerated, however some patients experience a "start up syndrome" with nausea, diarrhea, dizziness, and fatigue but that those should resolve soon after starting.  Advised that any nausea can be mitigated by taking it with food. I reviewed patient medications and found no drug interactions. Discussed how our PrEP process works here at the clinic including follow ups and lab monitoring every 3 months.  He is up-to-date on vaccines aside from receiving his second Shingrix shot in the coming weeks. He politely declines further STI screening today.    Plan: Check hepatitis A/B/C serologies Start Descovy  Follow-up with me on 4/11  [name], PharmD, CPP, BCIDP, AAHIVP Clinical Pharmacist Practitioner Infectious Diseases Clinical Pharmacist [facility] 01/19/2022, 2:48 PM

## 2022-01-20 ENCOUNTER — Telehealth (HOSPITAL_COMMUNITY): Payer: Self-pay | Admitting: *Deleted

## 2022-01-20 ENCOUNTER — Telehealth (HOSPITAL_COMMUNITY): Payer: Self-pay | Admitting: Internal Medicine

## 2022-01-20 ENCOUNTER — Other Ambulatory Visit: Payer: Self-pay

## 2022-01-20 ENCOUNTER — Encounter (HOSPITAL_COMMUNITY): Payer: Self-pay

## 2022-01-20 ENCOUNTER — Emergency Department (HOSPITAL_COMMUNITY)
Admission: EM | Admit: 2022-01-20 | Discharge: 2022-01-20 | Disposition: A | Discharge status: Home / Self Care | Payer: BLUE CROSS/BLUE SHIELD | Attending: Emergency Medicine | Admitting: Emergency Medicine

## 2022-01-20 ENCOUNTER — Telehealth: Payer: Self-pay | Admitting: Physician Assistant

## 2022-01-20 ENCOUNTER — Telehealth: Payer: Self-pay | Admitting: Nurse Practitioner

## 2022-01-20 ENCOUNTER — Emergency Department (HOSPITAL_COMMUNITY): Payer: BLUE CROSS/BLUE SHIELD

## 2022-01-20 DIAGNOSIS — I509 Heart failure, unspecified: Secondary | ICD-10-CM | POA: Diagnosis not present

## 2022-01-20 DIAGNOSIS — N189 Chronic kidney disease, unspecified: Secondary | ICD-10-CM | POA: Diagnosis not present

## 2022-01-20 DIAGNOSIS — I130 Hypertensive heart and chronic kidney disease with heart failure and stage 1 through stage 4 chronic kidney disease, or unspecified chronic kidney disease: Secondary | ICD-10-CM | POA: Insufficient documentation

## 2022-01-20 DIAGNOSIS — R079 Chest pain, unspecified: Secondary | ICD-10-CM | POA: Diagnosis present

## 2022-01-20 DIAGNOSIS — Z79899 Other long term (current) drug therapy: Secondary | ICD-10-CM | POA: Diagnosis not present

## 2022-01-20 LAB — BASIC METABOLIC PANEL
Anion gap: 10 (ref 5–15)
BUN: 19 mg/dL (ref 6–20)
CO2: 26 mmol/L (ref 22–32)
Calcium: 8.9 mg/dL (ref 8.9–10.3)
Chloride: 100 mmol/L (ref 98–111)
Creatinine, Ser: 1.08 mg/dL (ref 0.61–1.24)
GFR, Estimated: >60 mL/min (ref >60)
Glucose, Bld: 91 mg/dL (ref 70–99)
Potassium: 4.2 mmol/L (ref 3.5–5.1)
Sodium: 136 mmol/L (ref 135–145)

## 2022-01-20 LAB — CBC
HCT: 37.8 % — ABNORMAL LOW (ref 39.0–52.0)
Hemoglobin: 12.6 g/dL — ABNORMAL LOW (ref 13.0–17.0)
MCH: 31.7 pg (ref 26.0–34.0)
MCHC: 33.3 g/dL (ref 30.0–36.0)
MCV: 95.2 fL (ref 80.0–100.0)
Platelets: 316 10*3/uL (ref 150–400)
RBC: 3.97 MIL/uL — ABNORMAL LOW (ref 4.22–5.81)
RDW: 13.7 % (ref 11.5–15.5)
WBC: 9.1 10*3/uL (ref 4.0–10.5)
nRBC: 0 % (ref 0.0–0.2)

## 2022-01-20 LAB — TROPONIN I (HIGH SENSITIVITY)
Troponin I (High Sensitivity): 3 ng/L (ref <18)
Troponin I (High Sensitivity): 4 ng/L (ref <18)

## 2022-01-20 LAB — HEPATITIS A ANTIBODY, TOTAL: Hepatitis A AB,Total: NONREACTIVE

## 2022-01-20 LAB — HEPATITIS B SURFACE ANTIGEN: Hepatitis B Surface Ag: NONREACTIVE

## 2022-01-20 LAB — HEPATITIS B SURFACE ANTIBODY,QUALITATIVE: Hep B S Ab: NONREACTIVE

## 2022-01-20 LAB — HEPATITIS C ANTIBODY: Hepatitis C Ab: NONREACTIVE

## 2022-01-20 NOTE — Discharge Instructions (Addendum)
Take the lasix as directed by your provider.

## 2022-01-20 NOTE — Telephone Encounter (Signed)
Lvm for pt to call back. KH 

## 2022-01-20 NOTE — ED Provider Triage Note (Signed)
Emergency Medicine Provider Triage Evaluation Note  Caleb Landry , a 52 y.o. male  was evaluated in triage.  Pt complains of chest pressure and SOB since 7am this morning. Called the on call doctor and they recommended he take a fluid pill and potassium, has urinated 6 times since. Leg swelling started a few days ago. Started wheezing today as well.   Last echo was Jan 2023 with EF 40-45%. Lasix is only as needed, but did take this morning  Review of Systems  Positive: CP, SOB, bilateral leg swelling, abd distention, wheezing, productive cough Negative: Fever, chills  Physical Exam  BP 135/70 (BP Location: Right Arm)   Pulse 85   Temp 98.4 F (36.9 C)   Resp 18   SpO2 94%  Gen:   Awake, no distress   Resp:  Normal effort  MSK:   Moves extremities without difficulty  Other:    Medical Decision Making  Medically screening exam initiated at 4:22 PM.  Appropriate orders placed.  Clevon Khader was informed that the remainder of the evaluation will be completed by another provider, this initial triage assessment does not replace that evaluation, and the importance of remaining in the ED until their evaluation is complete.  Workup initiated   Kateri Plummer, PA-C 01/20/22 1625

## 2022-01-20 NOTE — Telephone Encounter (Signed)
Was here Wednesday - requesting to speak to nurse regarding allergy medications [phone]  Pt states also still waiting for Nurse to call her from Heart failure clinic

## 2022-01-20 NOTE — Telephone Encounter (Signed)
Pt request urgent appt w/echo, c/o fluid retention, please advise

## 2022-01-20 NOTE — Telephone Encounter (Signed)
Patient with h/o NICM called the after hour answering service with complaints of increasing SOB and lower extremity edema in the past week.  Friday alone, he has gained over 2 pounds.  He has not been keeping track of his blood pressure.  He has been compliant with his medication.  He usually take Lasix and potassium on a as needed basis.  For the past week, he has taken Lasix and potassium 3 different times.  I asked him to continue Lasix and potassium daily for 3 more days before going back to the previous schedule.  He needs a early follow-up with heart failure service.  I will send a staff message.  He also mentioned a brief episode where his vision went dark on Tuesday night.  He says he was walking from one bedroom to the next when his vision temporarily went dark, he leaned against the wall and did not fall down.   I instructed the patient to continue to monitor his weight and blood pressure.  If he started having paroxysmal nocturnal dyspnea or a full passing out spell, he will need to go to the emergency room.  During the meantime, we will do a trial of diuresis and see him in the clinic early.

## 2022-01-20 NOTE — ED Notes (Signed)
Ambulatory O2 96%, denies SOB, dizziness, light headedness.

## 2022-01-20 NOTE — ED Triage Notes (Signed)
Pt came in via POV with c/o CP that started around 1030, he also felt like he was having chest flutters, SOB & blurred vision. He called his cardiologist & was not able to get a return call from them soon enough. So he came to ED for eval, rates pain 4/10 that feels more like pressure.

## 2022-01-20 NOTE — Telephone Encounter (Signed)
Pt left vm requesting urgent appointment today due to fluid. We do not have any appts called pt to advise him to go the emergency room if this is an urgent need no answer.

## 2022-01-20 NOTE — ED Provider Notes (Signed)
[facility] Provider Note   CSN: [id number] Arrival date & time: 01/20/22  1524     History  Chief Complaint  Patient presents with   Chest Pain   SOB    Caleb Landry is a 52 y.o. male.  Pt is a 52 yo male with a pmhx significant for CHF with CM, CKD, and HTN.  Pt said his EF is improved and he only takes diuretics prn.  Pt said he's been having to take his diuretics more often.  He had some leg swelling.  He did call Dr. Clayborne Dana office today and was told to take extra lasix.  He did take the lasix today and has urinated multiple times today.  Pt said he feels much better now.  Pt was worried, so he came in.  He has had occasional chest pain.  No cp now.       Home Medications Prior to Admission medications   Medication Sig Start Date End Date Taking? Authorizing Provider  albuterol (PROVENTIL) (2.5 MG/3ML) 0.083% nebulizer solution Take 3 mLs (2.5 mg total) by nebulization every 6 (six) hours as needed for wheezing or shortness of breath. 01/18/22   Fenton Foy, NP  albuterol (VENTOLIN HFA) 108 (90 Base) MCG/ACT inhaler Inhale 2 puffs into the lungs every 6 (six) hours as needed for wheezing or shortness of breath. 01/18/22   Fenton Foy, NP  bisoprolol (ZEBETA) 5 MG tablet TAKE 1/2 TABLET(2.5 MG) BY MOUTH DAILY 01/18/22   Fenton Foy, NP  cetirizine (ZYRTEC) 10 MG tablet Take 1 tablet (10 mg total) by mouth daily. 01/18/22   Fenton Foy, NP  dapagliflozin propanediol (FARXIGA) 10 MG TABS tablet Take 1 tablet (10 mg total) by mouth daily before breakfast. 01/18/22   Fenton Foy, NP  emtricitabine-tenofovir AF (DESCOVY) 200-25 MG tablet Take 1 tablet by mouth daily. 01/19/22   Esmond Plants, RPH-CPP  fluticasone (FLONASE) 50 MCG/ACT nasal spray Place 2 sprays into both nostrils daily. 01/18/22   Fenton Foy, NP  furosemide (LASIX) 40 MG tablet Take 1 tablet (40 mg total) by mouth daily as needed. 01/18/22    Fenton Foy, NP  gabapentin (NEURONTIN) 300 MG capsule Take 2 capsules (600 mg total) by mouth 2 (two) times daily. 01/18/22 07/17/22  Fenton Foy, NP  potassium chloride SA (KLOR-CON M) 20 MEQ tablet Take 1 tablet (20 mEq total) by mouth daily as needed. Take when you take your lasix 01/18/22   Fenton Foy, NP  sacubitril-valsartan (ENTRESTO) 97-103 MG Take 1 tablet by mouth 2 (two) times daily. 01/18/22   Fenton Foy, NP  spironolactone (ALDACTONE) 25 MG tablet Take 1 tablet (25 mg total) by mouth daily. 01/18/22   Fenton Foy, NP  triamcinolone cream (KENALOG) 0.1 % Apply 1 Application topically 2 (two) times daily. 01/18/22   Fenton Foy, NP      Allergies    Penicillins    Review of Systems   Review of Systems  Cardiovascular:  Positive for leg swelling.  All other systems reviewed and are negative.   Physical Exam Updated Vital Signs BP 117/87   Pulse 81   Temp 98.2 F (36.8 C)   Resp 16   SpO2 95%  Physical Exam Vitals and nursing note reviewed.  Constitutional:      Appearance: He is well-developed.  HENT:     Head: Normocephalic and atraumatic.  Eyes:  Extraocular Movements: Extraocular movements intact.     Pupils: Pupils are equal, round, and reactive to light.  Cardiovascular:     Rate and Rhythm: Normal rate and regular rhythm.     Heart sounds: Normal heart sounds.  Pulmonary:     Effort: Pulmonary effort is normal.     Breath sounds: Normal breath sounds.  Abdominal:     General: Bowel sounds are normal.     Palpations: Abdomen is soft.  Musculoskeletal:        General: Normal range of motion.     Cervical back: Normal range of motion and neck supple.  Skin:    General: Skin is warm.     Capillary Refill: Capillary refill takes less than 2 seconds.  Neurological:     General: No focal deficit present.     Mental Status: He is alert and oriented to person, place, and time.  Psychiatric:        Mood and Affect: Mood normal.         Behavior: Behavior normal.     ED Results / Procedures / Treatments   Labs (all labs ordered are listed, but only abnormal results are displayed) Labs Reviewed  CBC - Abnormal; Notable for the following components:      Result Value   RBC 3.97 (*)    Hemoglobin 12.6 (*)    HCT 37.8 (*)    All other components within normal limits  BASIC METABOLIC PANEL  TROPONIN I (HIGH SENSITIVITY)  TROPONIN I (HIGH SENSITIVITY)    EKG EKG Interpretation  Date/Time:  Friday January 20 2022 15:48:55 EST Ventricular Rate:  79 PR Interval:  198 QRS Duration: 88 QT Interval:  372 QTC Calculation: 426 R Axis:   82 Text Interpretation: Normal sinus rhythm Normal ECG When compared with ECG of 13-Oct-2021 10:30, PREVIOUS ECG IS PRESENT No significant change since last tracing Confirmed by Isla Pence [phone]) on 01/20/2022 9:13:00 PM  Radiology DG Chest 2 View  Result Date: 01/20/2022 CLINICAL DATA:  Chest pain and shortness of breath EXAM: CHEST - 2 VIEW COMPARISON:  Chest x-ray 07/16/2020 FINDINGS: The heart size and mediastinal contours are within normal limits. Both lungs are clear. The visualized skeletal structures are unremarkable. IMPRESSION: No active cardiopulmonary disease. Electronically Signed   By: Ronney Asters M.D.   On: 01/20/2022 17:33    Procedures Procedures    Medications Ordered in ED Medications - No data to display  ED Course/ Medical Decision Making/ A&P                             Medical Decision Making  This patient presents to the ED for concern of leg swelling, this involves an extensive number of treatment options, and is a complaint that carries with it a high risk of complications and morbidity.  The differential diagnosis includes chf, copd, cardiac   Co morbidities that complicate the patient evaluation  CHF with CM, CKD, and HTN   Additional history obtained:  Additional history obtained from epic chart review   Lab Tests:  I  Ordered, and personally interpreted labs.  The pertinent results include:  cbc with hgb 12.6; bmp nl; trop nl   Imaging Studies ordered:  I ordered imaging studies including cxr  I independently visualized and interpreted imaging which showed No active cardiopulmonary disease.  I agree with the radiologist interpretation   Cardiac Monitoring:  The patient was maintained on a  cardiac monitor.  I personally viewed and interpreted the cardiac monitored which showed an underlying rhythm of: nsr   Medicines ordered and prescription drug management:  I have reviewed the patients home medicines and have made adjustments as needed  Problem List / ED Course:  CHF:  pt is feeling better after taking lasix from home.  He is able to ambulate without any difficulty.  His labs are nl.  Pt is stable for d/c.  He is to return if worse.  F/u with cards.   Reevaluation:  After the interventions noted above, I reevaluated the patient and found that they have :improved   Social Determinants of Health:  Lives at home   Dispostion:  After consideration of the diagnostic results and the patients response to treatment, I feel that the patent would benefit from discharge with outpatient f/u.          Final Clinical Impression(s) / ED Diagnoses Final diagnoses:  Acute congestive heart failure, unspecified heart failure type [facility])    Rx / DC Orders ED Discharge Orders     None         Jacalyn Lefevre, MD 01/20/22 2159

## 2022-01-20 NOTE — Telephone Encounter (Signed)
See other phone note, pt went to ER

## 2022-01-21 LAB — RPR+HIV+GC+CT PANEL
Chlamydia trachomatis, NAA: NEGATIVE
HIV Screen 4th Generation wRfx: NONREACTIVE
Neisseria Gonorrhoeae by PCR: NEGATIVE
RPR Ser Ql: NONREACTIVE

## 2022-01-23 ENCOUNTER — Other Ambulatory Visit: Payer: Self-pay

## 2022-01-23 ENCOUNTER — Telehealth: Payer: Self-pay

## 2022-01-23 NOTE — Telephone Encounter (Signed)
Transition Care Management Follow-up Telephone Call Date of discharge and from where: 01/20/22 How have you been since you were released from the hospital? Per pt he is doing well. Any questions or concerns? No  Items Reviewed: Did the pt receive and understand the discharge instructions provided? Yes  Medications obtained and verified? Yes  Other? No  Any new allergies since your discharge? No  Dietary orders reviewed? No Do you have support at home? Yes    Follow up appointments reviewed:  PCP Hospital f/u appt confirmed? Yes  Scheduled to see Tonya  on 01/27/22 @ 11:20am. [facility] f/u appt confirmed? No   Are transportation arrangements needed? Yes  If their condition worsens, is the pt aware to call PCP or go to the Emergency Dept.? Yes Was the patient provided with contact information for the PCP's office or ED? Yes Was to pt encouraged to call back with questions or concerns? Yes    .kh

## 2022-01-23 NOTE — Telephone Encounter (Signed)
Spoke with patient. He was not aware that the allergy medication was sent in. He was advised to call if he had any problems picking it up as it was sent to the correct pharmacy.

## 2022-01-24 ENCOUNTER — Ambulatory Visit: Payer: BLUE CROSS/BLUE SHIELD | Admitting: Pharmacist

## 2022-01-27 ENCOUNTER — Ambulatory Visit (INDEPENDENT_AMBULATORY_CARE_PROVIDER_SITE_OTHER): Payer: BLUE CROSS/BLUE SHIELD | Admitting: Nurse Practitioner

## 2022-01-27 ENCOUNTER — Encounter: Payer: Self-pay | Admitting: Nurse Practitioner

## 2022-01-27 VITALS — BP 104/62 | HR 78 | Temp 97.8°F | Ht 66.25 in | Wt 190.0 lb

## 2022-01-27 DIAGNOSIS — R7989 Other specified abnormal findings of blood chemistry: Secondary | ICD-10-CM

## 2022-01-27 DIAGNOSIS — I5022 Chronic systolic (congestive) heart failure: Secondary | ICD-10-CM | POA: Diagnosis not present

## 2022-01-27 MED ORDER — FEXOFENADINE HCL 180 MG PO TABS: 180.0000 mg | ORAL_TABLET | Freq: Every day | ORAL | 0 refills | Status: DC

## 2022-01-27 NOTE — Assessment & Plan Note (Signed)
-  Testosterone  2. Chronic systolic heart failure (HCC)  - CBC - Comprehensive metabolic panel   Follow up:  Follow up in 3 months

## 2022-01-27 NOTE — Patient Instructions (Signed)
1. Decreased testosterone level  - Testosterone  2. Chronic systolic heart failure (HCC)  - CBC - Comprehensive metabolic panel   Follow up:  Follow up in 3 months

## 2022-01-27 NOTE — Progress Notes (Signed)
@Patient  ID: Caleb Landry, male    DOB: April 13, 1970, 52 y.o.   MRN: [id number]  Chief Complaint  Patient presents with   Hospitalization Follow-up    Doing pretty good. Worried about his weight. States that it fluctuates at home a lot. Flonase not covered by insurance needs a different medication. Would like to have his testosterone checked.     Referring provider: Fenton Foy, NP   HPI  Patient presents today for an ED follow-up.  He was seen in the ED on 01/20/2022.  He was concerned about weight gain with congestive heart failure.  He states and potassium as directed.  He was last seen in this office on 01/18/2022.  His weight at that visit was 190 pounds.  His weight today in office is also 190 pounds.  We will get patient scheduled for a follow-up with cardiology before he leaves the office today.  Patient is requesting that testosterone levels be checked today.  Denies f/c/s, n/v/d, hemoptysis, PND, leg swelling Denies chest pain or edema        Allergies  Allergen Reactions   Penicillins Hives    Immunization History  Administered Date(s) Administered   Covid-19, Mrna,Vaccine(Spikevax)29yrs and older 11/04/2021   Influenza-Unspecified 10/17/2021   PNEUMOCOCCAL CONJUGATE-20 10/17/2021   Tdap 11/18/2021   Zoster Recombinat (Shingrix) 01/06/2022    Past Medical History:  Diagnosis Date   Asthma    Neuropathy     Tobacco History: Social History   Tobacco Use  Smoking Status Every Day   Packs/day: 0.50   Years: 30.00   Total pack years: 15.00   Types: Cigarettes   Last attempt to quit: 08/13/2020   Years since quitting: 1.4   Passive exposure: Current  Smokeless Tobacco Never   Ready to quit: Not Answered Counseling given: Not Answered   Outpatient Encounter Medications as of 01/27/2022  Medication Sig   albuterol (PROVENTIL) (2.5 MG/3ML) 0.083% nebulizer solution Take 3 mLs (2.5 mg total) by nebulization every 6 (six) hours as needed for wheezing or  shortness of breath.   albuterol (VENTOLIN HFA) 108 (90 Base) MCG/ACT inhaler Inhale 2 puffs into the lungs every 6 (six) hours as needed for wheezing or shortness of breath.   bisoprolol (ZEBETA) 5 MG tablet TAKE 1/2 TABLET(2.5 MG) BY MOUTH DAILY   cetirizine (ZYRTEC) 10 MG tablet Take 1 tablet (10 mg total) by mouth daily.   dapagliflozin propanediol (FARXIGA) 10 MG TABS tablet Take 1 tablet (10 mg total) by mouth daily before breakfast.   emtricitabine-tenofovir AF (DESCOVY) 200-25 MG tablet Take 1 tablet by mouth daily.   fexofenadine (ALLEGRA ALLERGY) 180 MG tablet Take 1 tablet (180 mg total) by mouth daily.   furosemide (LASIX) 40 MG tablet Take 1 tablet (40 mg total) by mouth daily as needed.   gabapentin (NEURONTIN) 300 MG capsule Take 2 capsules (600 mg total) by mouth 2 (two) times daily.   potassium chloride SA (KLOR-CON M) 20 MEQ tablet Take 1 tablet (20 mEq total) by mouth daily as needed. Take when you take your lasix   sacubitril-valsartan (ENTRESTO) 97-103 MG Take 1 tablet by mouth 2 (two) times daily.   spironolactone (ALDACTONE) 25 MG tablet Take 1 tablet (25 mg total) by mouth daily.   triamcinolone cream (KENALOG) 0.1 % Apply 1 Application topically 2 (two) times daily.   fluticasone (FLONASE) 50 MCG/ACT nasal spray Place 2 sprays into both nostrils daily. (Patient not taking: Reported on 01/27/2022)   No facility-administered encounter medications  on file as of 01/27/2022.     Review of Systems  Review of Systems  Constitutional: Negative.   HENT: Negative.    Cardiovascular: Negative.   Gastrointestinal: Negative.   Allergic/Immunologic: Negative.   Neurological: Negative.   Psychiatric/Behavioral: Negative.         Physical Exam  BP 104/62   Pulse 78   Temp 97.8 F (36.6 C) (Temporal)   Ht 5' 6.25" (1.683 m)   Wt 190 lb (86.2 kg)   SpO2 97%   BMI 30.44 kg/m   Wt Readings from Last 5 Encounters:  01/27/22 190 lb (86.2 kg)  01/18/22 190 lb 9.6 oz  (86.5 kg)  10/13/21 182 lb 3.2 oz (82.6 kg)  08/10/21 185 lb (83.9 kg)  08/01/21 180 lb (81.6 kg)     Physical Exam Vitals and nursing note reviewed.  Constitutional:      General: He is not in acute distress.    Appearance: He is well-developed.  Cardiovascular:     Rate and Rhythm: Normal rate and regular rhythm.  Pulmonary:     Effort: Pulmonary effort is normal.     Breath sounds: Normal breath sounds.  Skin:    General: Skin is warm and dry.  Neurological:     Mental Status: He is alert and oriented to person, place, and time.      Lab Results:  CBC    Component Value Date/Time   WBC 9.1 01/20/2022 1639   RBC 3.97 (L) 01/20/2022 1639   HGB 12.6 (L) 01/20/2022 1639   HGB 12.6 (L) 01/18/2022 1155   HCT 37.8 (L) 01/20/2022 1639   HCT 37.2 (L) 01/18/2022 1155   PLT 316 01/20/2022 1639   PLT 369 01/18/2022 1155   MCV 95.2 01/20/2022 1639   MCV 90 01/18/2022 1155   MCH 31.7 01/20/2022 1639   MCHC 33.3 01/20/2022 1639   RDW 13.7 01/20/2022 1639   RDW 12.6 01/18/2022 1155   LYMPHSABS 1.3 06/14/2021 1743   LYMPHSABS 2.1 09/13/2020 0942   MONOABS 1.5 (H) 06/14/2021 1743   EOSABS 0.0 06/14/2021 1743   EOSABS 0.1 09/13/2020 0942   BASOSABS 0.0 06/14/2021 1743   BASOSABS 0.0 09/13/2020 0942    BMET    Component Value Date/Time   NA 136 01/20/2022 1639   NA 137 01/18/2022 1155   K 4.2 01/20/2022 1639   CL 100 01/20/2022 1639   CO2 26 01/20/2022 1639   GLUCOSE 91 01/20/2022 1639   BUN 19 01/20/2022 1639   BUN 22 01/18/2022 1155   CREATININE 1.08 01/20/2022 1639   CREATININE 1.01 02/21/2021 1024   CALCIUM 8.9 01/20/2022 1639   GFRNONAA >60 01/20/2022 1639    BNP    Component Value Date/Time   BNP 8.4 10/13/2021 1124    ProBNP No results found for: "PROBNP"  Imaging: DG Chest 2 View  Result Date: 01/20/2022 CLINICAL DATA:  Chest pain and shortness of breath EXAM: CHEST - 2 VIEW COMPARISON:  Chest x-ray 07/16/2020 FINDINGS: The heart size and  mediastinal contours are within normal limits. Both lungs are clear. The visualized skeletal structures are unremarkable. IMPRESSION: No active cardiopulmonary disease. Electronically Signed   By: Ronney Asters M.D.   On: 01/20/2022 17:33     Assessment & Plan:   Decreased testosterone level - Testosterone  2. Chronic systolic heart failure (HCC)  - CBC - Comprehensive metabolic panel   Follow up:  Follow up in 3 months     Fenton Foy, NP  01/27/2022  

## 2022-01-28 LAB — CBC
Hematocrit: 39.1 % (ref 37.5–51.0)
Hemoglobin: 12.8 g/dL — ABNORMAL LOW (ref 13.0–17.7)
MCH: 30.3 pg (ref 26.6–33.0)
MCHC: 32.7 g/dL (ref 31.5–35.7)
MCV: 92 fL (ref 79–97)
Platelets: 331 10*3/uL (ref 150–450)
RBC: 4.23 x10E6/uL (ref 4.14–5.80)
RDW: 13.6 % (ref 11.6–15.4)
WBC: 10.1 10*3/uL (ref 3.4–10.8)

## 2022-01-28 LAB — COMPREHENSIVE METABOLIC PANEL
ALT: 13 IU/L (ref 0–44)
AST: 14 IU/L (ref 0–40)
Albumin/Globulin Ratio: 1.9 (ref 1.2–2.2)
Albumin: 4.7 g/dL (ref 4.1–5.1)
Alkaline Phosphatase: 107 IU/L (ref 44–121)
BUN/Creatinine Ratio: 14 (ref 9–20)
BUN: 14 mg/dL (ref 6–24)
Bilirubin Total: 0.4 mg/dL (ref 0.0–1.2)
CO2: 24 mmol/L (ref 20–29)
Calcium: 9.6 mg/dL (ref 8.7–10.2)
Chloride: 100 mmol/L (ref 96–106)
Creatinine, Ser: 1.01 mg/dL (ref 0.76–1.27)
Globulin, Total: 2.5 g/dL (ref 1.5–4.5)
Glucose: 85 mg/dL (ref 70–99)
Potassium: 4.5 mmol/L (ref 3.5–5.2)
Sodium: 140 mmol/L (ref 134–144)
Total Protein: 7.2 g/dL (ref 6.0–8.5)
eGFR: 91 mL/min/{1.73_m2} (ref >59)

## 2022-01-28 LAB — TESTOSTERONE: Testosterone: 222 ng/dL — ABNORMAL LOW (ref 264–916)

## 2022-02-01 ENCOUNTER — Ambulatory Visit: Payer: Commercial Managed Care - HMO | Admitting: Nurse Practitioner

## 2022-02-01 LAB — COLOGUARD: COLOGUARD: NEGATIVE

## 2022-02-03 ENCOUNTER — Other Ambulatory Visit: Payer: Self-pay | Admitting: Nurse Practitioner

## 2022-02-03 DIAGNOSIS — R7989 Other specified abnormal findings of blood chemistry: Secondary | ICD-10-CM

## 2022-02-20 NOTE — Progress Notes (Signed)
Advanced Heart Failure Clinic Note    PCP: Fenton Foy, NP HF Cardiologist: Dr. Haroldine Laws  HPI: Caleb Landry is a 52 y.o. male with history of asthma, tobacco abuse, and chronic systolic heart failure.  Admitted 7/22 for acute CHF exacerbation and started on lasix 40 mg IV bid. Echo obtained showing LVEF ~20% with global HK, G3DD, prominent LV. Cardiology and AHF team consulted to assist with management. Ultimately down 30 lbs after large volume diuresis. R/LHC: normal cors, severe NICM EF 20-25%, low filling pressures with moderately reduced CO. He was started on GDMT. SPEP negative. cMRI: LVEF 13%, RVEF 21%, nonspecific RV insertion LGE, mild MR, mild TR. Suspect NICM possibly due to OTC stimulants (he does not use meth). Hospitalization complicated by lower extremity swelling w/ concern for cellulitis; treated with doxycycline. His discharge weight 165 was lbs.   Last seen for f/u 01/23. Volume looked good. Entesto increased. Furosemide switched to PRN.  Echo 01/23: EF 40-45%, RV okay  Here for f/u. Doing ok. Denies CP or SOB. No edema, orthopnea or PND. Has been out of Jardiance for one week. Having problems with tingling in arms and legs. Smoking < 0.5 ppd. Still using stimulants  CARDIAC STUDIES:  - Echo 01/23: EF 40-45%, RV okay  - cMRI (7/22): LVEF 13%, RV severe systolic dysfunction [id number], mild MR/TR, RV insertion site LGE  - Echo (07/16/20): EF < 20, moderately dilated LV, mild LVH, enlarged RV with moderately reduced RV function, RVSP 54 mmHg, severe LAE, mild to moderate MR/TR  - R/LHC (07/19/20): normal cors, severe NICM EF 20-25%, low filling pressures w/ mod reduced CO   Ao = 96/70 (81) LV = 90/8 RA =  1 RV = 29/2 PA = 29/8 (15) PCW = 5 Fick cardiac output/index = 4.1/2.1 PVR = 2.5 SVR = 1572 Ao sat = 96% PA sat = 62%, 63%  Past Medical History:  Diagnosis Date   Asthma    Neuropathy    Current Outpatient Medications  Medication Sig Dispense Refill    albuterol (PROVENTIL) (2.5 MG/3ML) 0.083% nebulizer solution Take 3 mLs (2.5 mg total) by nebulization every 6 (six) hours as needed for wheezing or shortness of breath. 150 mL 1   albuterol (VENTOLIN HFA) 108 (90 Base) MCG/ACT inhaler Inhale 2 puffs into the lungs every 6 (six) hours as needed for wheezing or shortness of breath. 8.5 g 2   bisoprolol (ZEBETA) 5 MG tablet TAKE 1/2 TABLET(2.5 MG) BY MOUTH DAILY 45 tablet 3   cetirizine (ZYRTEC) 10 MG tablet Take 1 tablet (10 mg total) by mouth daily. 90 tablet 0   dapagliflozin propanediol (FARXIGA) 10 MG TABS tablet Take 1 tablet (10 mg total) by mouth daily before breakfast. 90 tablet 3   emtricitabine-tenofovir AF (DESCOVY) 200-25 MG tablet Take 1 tablet by mouth daily. 30 tablet 2   fexofenadine (ALLEGRA ALLERGY) 180 MG tablet Take 1 tablet (180 mg total) by mouth daily. 30 tablet 0   fluticasone (FLONASE) 50 MCG/ACT nasal spray Place 2 sprays into both nostrils daily. (Patient not taking: Reported on 01/27/2022) 16 g 6   furosemide (LASIX) 40 MG tablet Take 1 tablet (40 mg total) by mouth daily as needed. 30 tablet 6   gabapentin (NEURONTIN) 300 MG capsule Take 2 capsules (600 mg total) by mouth 2 (two) times daily. 120 capsule 5   potassium chloride SA (KLOR-CON M) 20 MEQ tablet Take 1 tablet (20 mEq total) by mouth daily as needed. Take when you take your  lasix 30 tablet 2   sacubitril-valsartan (ENTRESTO) 97-103 MG Take 1 tablet by mouth 2 (two) times daily. 180 tablet 3   spironolactone (ALDACTONE) 25 MG tablet Take 1 tablet (25 mg total) by mouth daily. 90 tablet 3   triamcinolone cream (KENALOG) 0.1 % Apply 1 Application topically 2 (two) times daily. 30 g 0   No current facility-administered medications for this visit.   Allergies  Allergen Reactions   Penicillins Hives   Social History   Socioeconomic History   Marital status: Widowed    Spouse name: Not on file   Number of children: Not on file   Years of education: Not on file    Highest education level: Not on file  Occupational History   Not on file  Tobacco Use   Smoking status: Every Day    Packs/day: 0.50    Years: 30.00    Total pack years: 15.00    Types: Cigarettes    Last attempt to quit: 08/13/2020    Years since quitting: 1.5    Passive exposure: Current   Smokeless tobacco: Never  Vaping Use   Vaping Use: Never used  Substance and Sexual Activity   Alcohol use: Yes    Comment: rarely   Drug use: Never   Sexual activity: Yes  Other Topics Concern   Not on file  Social History Narrative   Not on file   Social Determinants of Health   Financial Resource Strain: Medium Risk (04/29/2021)   Overall Financial Resource Strain (CARDIA)    Difficulty of Paying Living Expenses: Somewhat hard  Food Insecurity: No Food Insecurity (04/29/2021)   Hunger Vital Sign    Worried About Running Out of Food in the Last Year: Never true    Ran Out of Food in the Last Year: Never true  Transportation Needs: Unmet Transportation Needs (04/29/2021)   PRAPARE - Hydrologist (Medical): Yes    Lack of Transportation (Non-Medical): Yes  Physical Activity: Not on file  Stress: Not on file  Social Connections: Not on file  Intimate Partner Violence: Not on file   Family History  Problem Relation Age of Onset   Crohn's disease Mother    There were no vitals taken for this visit.  Wt Readings from Last 3 Encounters:  01/27/22 86.2 kg (190 lb)  01/18/22 86.5 kg (190 lb 9.6 oz)  10/13/21 82.6 kg (182 lb 3.2 oz)   PHYSICAL EXAM: General:  Well appearing. No resp difficulty HEENT: normal Neck: supple. no JVD. Carotids 2+ bilat; no bruits. No lymphadenopathy or thryomegaly appreciated. Cor: Barrel chested . Regular rate & rhythm. No rubs, gallops or murmurs. Lungs: clear decreased throughout Abdomen: soft, nontender, nondistended. No hepatosplenomegaly. No bruits or masses. Good bowel sounds. Extremities: no cyanosis, clubbing,  rash, edema severe varicose veins ? Possible psoriatic plaques Neuro: alert & orientedx3, cranial nerves grossly intact. moves all 4 extremities w/o difficulty. Affect pleasant  NSR 72 1st AVB (231m) No ST-T wave abnormalities. Personally reviewed   ASSESSMENT & PLAN:  1. Chronic Systolic HF - Echo ([id number]: EF < 20% RV moderately down mild to mod MR/TR.  - Cath with normal coronaries, low filling pressures and reduced cardiac output. Cardiac MRI pending. - Suspect NICM possibly due to OTC stimulants (he does not use meth). There is some hyper trabeculation in the LV cavity but not felt to be consistent with LV noncompaction.  - SPEP negative. - cMRI (7/22): EF 13%, RVEF 21%, mild  MR/TR, nonspecific RV insertion LGE - Echo 01/23: EF 40-45% - NYHA II Volume looks good. Off lasix  - Continue Entresto  97/103 mg bid. Entresto thru [employer]. - Continue bisoprolol 2.5 mg daily (h/o asthma). - Continue spiro 25 mg daily. - Continue Jardiance 10 mg daily. Jardiance through [employer]. (Has been out for a week) -> will d/w PharmD to restart - Will not titrate GDMT with low BP - Labs today - F/u 4 months with echo   2. Tobacco use - Previously quit, currently smoking < 10 cigarettes daily - Encouraged complete  cessation.     [place], FNP 02/20/22

## 2022-02-21 ENCOUNTER — Encounter (HOSPITAL_COMMUNITY): Payer: Self-pay

## 2022-02-21 ENCOUNTER — Telehealth: Payer: Self-pay

## 2022-02-21 ENCOUNTER — Ambulatory Visit (HOSPITAL_BASED_OUTPATIENT_CLINIC_OR_DEPARTMENT_OTHER)
Admission: RE | Admit: 2022-02-21 | Discharge: 2022-02-21 | Disposition: A | Discharge status: Home / Self Care | Payer: BLUE CROSS/BLUE SHIELD | Source: Ambulatory Visit | Attending: Family Medicine | Admitting: Family Medicine

## 2022-02-21 ENCOUNTER — Ambulatory Visit (HOSPITAL_COMMUNITY)
Admission: RE | Admit: 2022-02-21 | Discharge: 2022-02-21 | Disposition: A | Discharge status: Home / Self Care | Payer: BLUE CROSS/BLUE SHIELD | Source: Ambulatory Visit | Attending: Nurse Practitioner | Admitting: Nurse Practitioner

## 2022-02-21 VITALS — BP 138/78 | HR 62 | Wt 198.8 lb

## 2022-02-21 DIAGNOSIS — I5022 Chronic systolic (congestive) heart failure: Secondary | ICD-10-CM

## 2022-02-21 DIAGNOSIS — Z720 Tobacco use: Secondary | ICD-10-CM | POA: Diagnosis not present

## 2022-02-21 DIAGNOSIS — M7989 Other specified soft tissue disorders: Secondary | ICD-10-CM | POA: Diagnosis not present

## 2022-02-21 DIAGNOSIS — Z79899 Other long term (current) drug therapy: Secondary | ICD-10-CM | POA: Insufficient documentation

## 2022-02-21 DIAGNOSIS — J45909 Unspecified asthma, uncomplicated: Secondary | ICD-10-CM | POA: Diagnosis not present

## 2022-02-21 DIAGNOSIS — F17210 Nicotine dependence, cigarettes, uncomplicated: Secondary | ICD-10-CM | POA: Diagnosis not present

## 2022-02-21 LAB — BASIC METABOLIC PANEL
Anion gap: 8 (ref 5–15)
BUN: 15 mg/dL (ref 6–20)
CO2: 29 mmol/L (ref 22–32)
Calcium: 9.1 mg/dL (ref 8.9–10.3)
Chloride: 100 mmol/L (ref 98–111)
Creatinine, Ser: 0.92 mg/dL (ref 0.61–1.24)
GFR, Estimated: >60 mL/min (ref >60)
Glucose, Bld: 85 mg/dL (ref 70–99)
Potassium: 4.3 mmol/L (ref 3.5–5.1)
Sodium: 137 mmol/L (ref 135–145)

## 2022-02-21 LAB — ECHOCARDIOGRAM COMPLETE
AV Mean grad: 2 mmHg
AV Peak grad: 3.7 mmHg
Ao pk vel: 0.97 m/s
Area-P 1/2: 2.8 cm2

## 2022-02-21 LAB — BRAIN NATRIURETIC PEPTIDE: B Natriuretic Peptide: 6.1 pg/mL (ref 0.0–100.0)

## 2022-02-21 NOTE — Telephone Encounter (Signed)
Pt is needing a prior Auth for echocardiogram   The echo dept on phone and pt is there now.

## 2022-02-21 NOTE — Telephone Encounter (Signed)
Spoke to pt [employer] insurance company to obtain Auth for pt echo. No auth was needed and the reference number was [id number]. Caleb Landry

## 2022-02-21 NOTE — Patient Instructions (Addendum)
Thank you for coming in today  Labs were done today, if any labs are abnormal the clinic will call you No news is good news  Your physician recommends that you schedule a follow-up appointment in:  6 months with Dr.Bensimhon You will receive a reminder letter in the mail a few months in advance. If you don't receive a letter, please call our office to schedule the follow-up appointment.    Do the following things EVERYDAY: Weigh yourself in the morning before breakfast. Write it down and keep it in a log. Take your medicines as prescribed Eat low salt foods--Limit salt (sodium) to 2000 mg per day.  Stay as active as you can everyday Limit all fluids for the day to less than 2 liters  At the [facility], you and your health needs are our priority. As part of our continuing mission to provide you with exceptional heart care, we have created designated Provider Care Teams. These Care Teams include your primary Cardiologist (physician) and Advanced Practice Providers (APPs- Physician Assistants and Nurse Practitioners) who all work together to provide you with the care you need, when you need it.   You may see any of the following providers on your designated Care Team at your next follow up: Dr Glori Bickers Dr Loralie Champagne Dr. Roxana Hires, NP Lyda Jester, [place] [facility] [place], [place] Forestine Na, NP Audry Riles, PharmD   Please be sure to bring in all your medications bottles to every appointment.    Thank you for choosing [facility]   If you have any questions or concerns before your next appointment please send [place] a message through [place] or call our office at [phone].    TO LEAVE A MESSAGE FOR THE NURSE SELECT OPTION 2, PLEASE LEAVE A MESSAGE INCLUDING: YOUR NAME DATE OF BIRTH CALL BACK NUMBER REASON FOR CALL**this is important as we prioritize the call backs  YOU WILL  RECEIVE A CALL BACK THE SAME DAY AS LONG AS YOU CALL BEFORE 4:00 PM

## 2022-02-21 NOTE — Progress Notes (Signed)
Echocardiogram 2D Echocardiogram has been performed.  Ronny Flurry 02/21/2022, 10:03 AM

## 2022-02-22 NOTE — Telephone Encounter (Signed)
No auth was needed. And  auth team was advised [facility]

## 2022-02-23 ENCOUNTER — Other Ambulatory Visit: Payer: Self-pay | Admitting: Nurse Practitioner

## 2022-03-20 ENCOUNTER — Other Ambulatory Visit: Payer: Self-pay | Admitting: Pharmacist

## 2022-03-20 ENCOUNTER — Other Ambulatory Visit: Payer: Self-pay

## 2022-03-20 DIAGNOSIS — Z79899 Other long term (current) drug therapy: Secondary | ICD-10-CM

## 2022-03-20 DIAGNOSIS — J4520 Mild intermittent asthma, uncomplicated: Secondary | ICD-10-CM

## 2022-03-20 MED ORDER — ALBUTEROL SULFATE (2.5 MG/3ML) 0.083% IN NEBU: 2.5000 mg | INHALATION_SOLUTION | Freq: Four times a day (QID) | RESPIRATORY_TRACT | 1 refills | Status: AC | PRN

## 2022-03-20 MED ORDER — FLUTICASONE PROPIONATE 50 MCG/ACT NA SUSP: 2.0000 | Freq: Every day | NASAL | 6 refills | Status: DC

## 2022-03-20 MED ORDER — DESCOVY 200-25 MG PO TABS: 1.0000 | ORAL_TABLET | Freq: Every day | ORAL | 0 refills | Status: DC

## 2022-03-20 MED ORDER — ENTRESTO 97-103 MG PO TABS: 1.0000 | ORAL_TABLET | Freq: Two times a day (BID) | ORAL | 3 refills | Status: DC

## 2022-03-20 MED ORDER — FEXOFENADINE HCL 180 MG PO TABS: ORAL_TABLET | ORAL | 0 refills | Status: DC

## 2022-03-20 MED ORDER — ALBUTEROL SULFATE HFA 108 (90 BASE) MCG/ACT IN AERS: 2.0000 | INHALATION_SPRAY | Freq: Four times a day (QID) | RESPIRATORY_TRACT | 2 refills | Status: DC | PRN

## 2022-03-20 NOTE — Progress Notes (Signed)
Patient reached out to Butch Penny stating he would like his script sent to preferred [facility] in [place] after moving recently. Only sending 30-day supply as he is scheduled for PrEP f/u next month.  Alfonse Spruce, PharmD, CPP, BCIDP, [place] Clinical Pharmacist Practitioner [facility] [place] [facility]

## 2022-04-13 ENCOUNTER — Ambulatory Visit: Payer: BLUE CROSS/BLUE SHIELD | Admitting: Pharmacist

## 2022-04-14 ENCOUNTER — Other Ambulatory Visit: Payer: Self-pay | Admitting: Pharmacist

## 2022-04-14 DIAGNOSIS — Z79899 Other long term (current) drug therapy: Secondary | ICD-10-CM

## 2022-04-17 ENCOUNTER — Ambulatory Visit (INDEPENDENT_AMBULATORY_CARE_PROVIDER_SITE_OTHER): Payer: BLUE CROSS/BLUE SHIELD | Admitting: Pharmacist

## 2022-04-17 ENCOUNTER — Other Ambulatory Visit: Payer: Self-pay

## 2022-04-17 ENCOUNTER — Encounter: Payer: Self-pay | Admitting: Pharmacist

## 2022-04-17 DIAGNOSIS — Z79899 Other long term (current) drug therapy: Secondary | ICD-10-CM

## 2022-04-17 DIAGNOSIS — Z2981 Encounter for HIV pre-exposure prophylaxis: Secondary | ICD-10-CM

## 2022-04-17 DIAGNOSIS — Z23 Encounter for immunization: Secondary | ICD-10-CM | POA: Diagnosis not present

## 2022-04-17 NOTE — Progress Notes (Signed)
Date:  04/17/2022   HPI: Caleb Landry is a 52 y.o. male who presents to the [facility] pharmacy clinic for HIV PrEP follow-up.  Insured      Uninsured     Patient Active Problem List   Diagnosis Date Noted   Decreased testosterone level 01/27/2022   Varicose vein of leg 08/10/2021   Neuropathy 08/01/2021   Chronic systolic heart failure 01/31/2021   Chronic diastolic heart failure 08/03/2020   Acute congestive heart failure 07/16/2020   Tobacco abuse 07/16/2020   Asthma 07/16/2020   Acute CHF (congestive heart failure) 07/16/2020   Hammer toes of both feet 01/03/2019    Patient's Medications  New Prescriptions   No medications on file  Previous Medications   ALBUTEROL (PROVENTIL) (2.5 MG/3ML) 0.083% NEBULIZER SOLUTION    Take 3 mLs (2.5 mg total) by nebulization every 6 (six) hours as needed for wheezing or shortness of breath.   ALBUTEROL (VENTOLIN HFA) 108 (90 BASE) MCG/ACT INHALER    Inhale 2 puffs into the lungs every 6 (six) hours as needed for wheezing or shortness of breath.   BISOPROLOL (ZEBETA) 5 MG TABLET    TAKE 1/2 TABLET(2.5 MG) BY MOUTH DAILY   CETIRIZINE (ZYRTEC) 10 MG TABLET    Take 1 tablet (10 mg total) by mouth daily.   DAPAGLIFLOZIN PROPANEDIOL (FARXIGA) 10 MG TABS TABLET    Take 1 tablet (10 mg total) by mouth daily before breakfast.   EMTRICITABINE-TENOFOVIR AF (DESCOVY) 200-25 MG TABLET    Take 1 tablet by mouth daily.   FEXOFENADINE (ALLEGRA) 180 MG TABLET    TAKE 1 TABLET(180 MG) BY MOUTH DAILY   FLUTICASONE (FLONASE) 50 MCG/ACT NASAL SPRAY    Place 2 sprays into both nostrils daily.   FUROSEMIDE (LASIX) 40 MG TABLET    Take 1 tablet (40 mg total) by mouth daily as needed.   GABAPENTIN (NEURONTIN) 300 MG CAPSULE    Take 2 capsules (600 mg total) by mouth 2 (two) times daily.   POTASSIUM CHLORIDE SA (KLOR-CON M) 20 MEQ TABLET    Take 1 tablet (20 mEq total) by mouth daily as needed. Take when you take your lasix   SACUBITRIL-VALSARTAN (ENTRESTO) 97-103 MG     Take 1 tablet by mouth 2 (two) times daily.   SPIRONOLACTONE (ALDACTONE) 25 MG TABLET    Take 1 tablet (25 mg total) by mouth daily.   TRIAMCINOLONE CREAM (KENALOG) 0.1 %    Apply 1 Application topically 2 (two) times daily.  Modified Medications   No medications on file  Discontinued Medications   No medications on file    Allergies: Allergies  Allergen Reactions   Penicillins Hives    Past Medical History: Past Medical History:  Diagnosis Date   Asthma    Neuropathy     Social History: Social History   Socioeconomic History   Marital status: Widowed    Spouse name: Not on file   Number of children: Not on file   Years of education: Not on file   Highest education level: Not on file  Occupational History   Not on file  Tobacco Use   Smoking status: Every Day    Packs/day: 0.50    Years: 30.00    Additional pack years: 0.00    Total pack years: 15.00    Types: Cigarettes    Last attempt to quit: 08/13/2020    Years since quitting: 1.6    Passive exposure: Current   Smokeless tobacco: Never  Vaping Use   Vaping Use: Never used  Substance and Sexual Activity   Alcohol use: Yes    Comment: rarely   Drug use: Never   Sexual activity: Yes  Other Topics Concern   Not on file  Social History Narrative   Not on file   Social Determinants of Health   Financial Resource Strain: Medium Risk (04/29/2021)   Overall Financial Resource Strain (CARDIA)    Difficulty of Paying Living Expenses: Somewhat hard  Food Insecurity: No Food Insecurity (04/29/2021)   Hunger Vital Sign    Worried About Running Out of Food in the Last Year: Never true    Ran Out of Food in the Last Year: Never true  Transportation Needs: Unmet Transportation Needs (04/29/2021)   PRAPARE - Transportation    Lack of Transportation (Medical): Yes    Lack of Transportation (Non-Medical): Yes  Physical Activity: Not on file  Stress: Not on file  Social Connections: Not on file       01/19/2022     2:47 PM 01/19/2022    2:00 PM  CHL HIV PREP FLOWSHEET RESULTS  Insurance Status  Insured  How did you hear?  referred from PCP  Gender at birth  Male  Gender identity  cis-Male  Risk for HIV Condomless vaginal or anal intercourse;>5 partners in past 6 mos (regardless of condom use) In sexual relationship with HIV+ partner  Sex Partners  Men only  # sex partners past 3-6 mos  3  Sex activity preferences  Insertive and receptive;Oral  Condom use  Yes  % condom use  5  Treated for STI?  No  HIV symptoms?  None  PrEP Eligibility  Yes  Paper work received?  No    Labs:  SCr: Lab Results  Component Value Date   CREATININE 0.92 02/21/2022   CREATININE 1.01 01/27/2022   CREATININE 1.08 01/20/2022   CREATININE 0.99 01/18/2022   CREATININE 1.21 10/13/2021   HIV Lab Results  Component Value Date   HIV Non Reactive 01/18/2022   HIV Non Reactive 04/05/2021   HIV Non Reactive 07/16/2020   Hepatitis B Lab Results  Component Value Date   HEPBSAB NON-REACTIVE 01/19/2022   HEPBSAG NON-REACTIVE 01/19/2022   Hepatitis C Lab Results  Component Value Date   HEPCAB NON-REACTIVE 01/19/2022   Hepatitis A Lab Results  Component Value Date   HAV NON-REACTIVE 01/19/2022   RPR and STI Lab Results  Component Value Date   LABRPR Non Reactive 01/18/2022   LABRPR Non Reactive 04/05/2021    STI Results CT  Latest Ref Rng & Units Negative  01/18/2022 12:00 PM Negative   04/05/2021 10:48 AM Negative     Assessment: Caleb Landry presents to the clinic for a PrEP follow up visit. He has tolerated Descovy well over the past three months and is feeling well today. Will measure a routine HIV antibody. He declines STI testing today, but would like to do it next time. He has had no new partners and no symptoms of an STI.   Caleb Landry is eligible for the hepatitis A and B vaccine today. He agrees to get both after some discussion. I scheduled him for a vaccine follow up appointment in May for his second  hepatitis B. He will follow up to complete the hepatitis A vaccine series over the next several months.    Plan: - Continue Descovy x 3 months pending negative HIV antibody  - Hepatitis A and Hepatitis B vaccine administered today  -  Next hepatitis B vaccine scheduled for May 15th, 2024 with Marchelle Folks  - Next PrEP appointment scheduled for July 17th with Mikeal Hawthorne, PharmD  PGY1 Pharmacy Resident

## 2022-04-18 LAB — HIV ANTIBODY (ROUTINE TESTING W REFLEX): HIV 1&2 Ab, 4th Generation: NONREACTIVE

## 2022-04-18 NOTE — Telephone Encounter (Signed)
Pt called and said that his foot is turning a REAL dark red color and that the [facility] Specialist can't see him til JAN 2025.  He asked if their is someone else he can be referraled too.

## 2022-04-19 ENCOUNTER — Other Ambulatory Visit: Payer: Self-pay

## 2022-04-19 DIAGNOSIS — Z79899 Other long term (current) drug therapy: Secondary | ICD-10-CM

## 2022-04-19 MED ORDER — DESCOVY 200-25 MG PO TABS: 1.0000 | ORAL_TABLET | Freq: Every day | ORAL | 2 refills | Status: AC

## 2022-04-19 NOTE — Telephone Encounter (Signed)
Patient has an appt with Derm until January 2025, wants to be seen at another office sooner.

## 2022-04-27 ENCOUNTER — Telehealth: Payer: Self-pay

## 2022-04-27 NOTE — Telephone Encounter (Signed)
Left a message. Gh 

## 2022-05-16 NOTE — Progress Notes (Deleted)
05/16/2022  HPI: Caleb Landry is a 52 y.o. male who presents to the [facility] clinic today to follow up for second hepatitis B vaccination.  Patient Active Problem List   Diagnosis Date Noted   Decreased testosterone level 01/27/2022   Varicose vein of leg 08/10/2021   Neuropathy 08/01/2021   Chronic systolic heart failure (HCC) 01/31/2021   Chronic diastolic heart failure (HCC) 08/03/2020   Acute congestive heart failure (HCC) 07/16/2020   Tobacco abuse 07/16/2020   Asthma 07/16/2020   Acute CHF (congestive heart failure) (HCC) 07/16/2020   Hammer toes of both feet 01/03/2019    Patient's Medications  New Prescriptions   No medications on file  Previous Medications   ALBUTEROL (PROVENTIL) (2.5 MG/3ML) 0.083% NEBULIZER SOLUTION    Take 3 mLs (2.5 mg total) by nebulization every 6 (six) hours as needed for wheezing or shortness of breath.   ALBUTEROL (VENTOLIN HFA) 108 (90 BASE) MCG/ACT INHALER    Inhale 2 puffs into the lungs every 6 (six) hours as needed for wheezing or shortness of breath.   BISOPROLOL (ZEBETA) 5 MG TABLET    TAKE 1/2 TABLET(2.5 MG) BY MOUTH DAILY   CETIRIZINE (ZYRTEC) 10 MG TABLET    Take 1 tablet (10 mg total) by mouth daily.   DAPAGLIFLOZIN PROPANEDIOL (FARXIGA) 10 MG TABS TABLET    Take 1 tablet (10 mg total) by mouth daily before breakfast.   EMTRICITABINE-TENOFOVIR AF (DESCOVY) 200-25 MG TABLET    Take 1 tablet by mouth daily.   FEXOFENADINE (ALLEGRA) 180 MG TABLET    TAKE 1 TABLET(180 MG) BY MOUTH DAILY   FLUTICASONE (FLONASE) 50 MCG/ACT NASAL SPRAY    Place 2 sprays into both nostrils daily.   FUROSEMIDE (LASIX) 40 MG TABLET    Take 1 tablet (40 mg total) by mouth daily as needed.   GABAPENTIN (NEURONTIN) 300 MG CAPSULE    Take 2 capsules (600 mg total) by mouth 2 (two) times daily.   POTASSIUM CHLORIDE SA (KLOR-CON M) 20 MEQ TABLET    Take 1 tablet (20 mEq total) by mouth daily as needed. Take when you take your lasix   SACUBITRIL-VALSARTAN (ENTRESTO) 97-103  MG    Take 1 tablet by mouth 2 (two) times daily.   SPIRONOLACTONE (ALDACTONE) 25 MG TABLET    Take 1 tablet (25 mg total) by mouth daily.   TRIAMCINOLONE CREAM (KENALOG) 0.1 %    Apply 1 Application topically 2 (two) times daily.  Modified Medications   No medications on file  Discontinued Medications   No medications on file    Allergies: Allergies  Allergen Reactions   Penicillins Hives    Past Medical History: Past Medical History:  Diagnosis Date   Asthma    Neuropathy     Social History: Social History   Socioeconomic History   Marital status: Widowed    Spouse name: Not on file   Number of children: Not on file   Years of education: Not on file   Highest education level: Not on file  Occupational History   Not on file  Tobacco Use   Smoking status: Every Day    Packs/day: 0.50    Years: 30.00    Additional pack years: 0.00    Total pack years: 15.00    Types: Cigarettes    Last attempt to quit: 08/13/2020    Years since quitting: 1.7    Passive exposure: Current   Smokeless tobacco: Never  Vaping Use   Vaping Use:  Never used  Substance and Sexual Activity   Alcohol use: Yes    Comment: rarely   Drug use: Never   Sexual activity: Yes  Other Topics Concern   Not on file  Social History Narrative   Not on file   Social Determinants of Health   Financial Resource Strain: Medium Risk (04/29/2021)   Overall Financial Resource Strain (CARDIA)    Difficulty of Paying Living Expenses: Somewhat hard  Food Insecurity: No Food Insecurity (04/29/2021)   Hunger Vital Sign    Worried About Running Out of Food in the Last Year: Never true    Ran Out of Food in the Last Year: Never true  Transportation Needs: Unmet Transportation Needs (04/29/2021)   PRAPARE - Administrator, Civil Service (Medical): Yes    Lack of Transportation (Non-Medical): Yes  Physical Activity: Not on file  Stress: Not on file  Social Connections: Not on file    Assessment: [name] presents to ID clinic today for second dose of his hepatitis B vaccination. No issues with last injection reported.*** He is currently on Dexcovy therapy.    Plan: - Administer HepB vaccination - Follow up for PrEP with Marchelle Folks on 07/19/22  Irish Elders, PharmD PGY-1 [facility] Pharmacy Resident

## 2022-05-17 ENCOUNTER — Ambulatory Visit: Payer: BLUE CROSS/BLUE SHIELD | Admitting: Pharmacist

## 2022-05-22 ENCOUNTER — Telehealth: Payer: Self-pay | Admitting: Nurse Practitioner

## 2022-05-22 NOTE — Telephone Encounter (Signed)
Pt called and said that he wants [place] to call infectious disease to have his injection done.  He lives far and only wants his appts around the same day he stated

## 2022-05-24 ENCOUNTER — Ambulatory Visit: Payer: Self-pay | Admitting: Nurse Practitioner

## 2022-05-25 NOTE — Telephone Encounter (Signed)
LVM for pt to call back . KH 

## 2022-05-30 NOTE — Telephone Encounter (Signed)
Pt needed his appt moved to another day. KH

## 2022-06-01 ENCOUNTER — Ambulatory Visit: Payer: Self-pay | Admitting: Nurse Practitioner

## 2022-06-08 ENCOUNTER — Other Ambulatory Visit: Payer: Self-pay

## 2022-06-08 ENCOUNTER — Ambulatory Visit (INDEPENDENT_AMBULATORY_CARE_PROVIDER_SITE_OTHER): Payer: BLUE CROSS/BLUE SHIELD | Admitting: Pharmacist

## 2022-06-08 ENCOUNTER — Ambulatory Visit (INDEPENDENT_AMBULATORY_CARE_PROVIDER_SITE_OTHER): Payer: BLUE CROSS/BLUE SHIELD | Admitting: Nurse Practitioner

## 2022-06-08 VITALS — BP 87/65 | HR 101 | Temp 97.2°F | Wt 178.8 lb

## 2022-06-08 DIAGNOSIS — Z113 Encounter for screening for infections with a predominantly sexual mode of transmission: Secondary | ICD-10-CM | POA: Diagnosis not present

## 2022-06-08 DIAGNOSIS — R7989 Other specified abnormal findings of blood chemistry: Secondary | ICD-10-CM | POA: Diagnosis not present

## 2022-06-08 DIAGNOSIS — Z23 Encounter for immunization: Secondary | ICD-10-CM | POA: Diagnosis not present

## 2022-06-08 NOTE — Progress Notes (Signed)
@Patient  ID: Caleb Landry, male    DOB: Oct 13, 1970, 52 y.o.   MRN: [id number]  Chief Complaint  Patient presents with   Exposure to STD    Referring provider: Ivonne Andrew, NP   HPI  Patient presents today for an acute visit.  Patient was contacted by health department and told that he needed RPR testing.  Stated that he was exposed.  Patient arrived to his appointment 45 minutes late and the lab was closed today.  He will return tomorrow for STD screening.  Patient would also like testosterone checked at that time. Denies f/c/s, n/v/d, hemoptysis, PND, leg swelling Denies chest pain or edema       Allergies  Allergen Reactions   Penicillins Hives    Immunization History  Administered Date(s) Administered   Covid-19, Mrna,Vaccine(Spikevax)43yrs and older 11/04/2021   Hepatitis A, Adult 04/17/2022   Hepb-cpg 04/17/2022, 06/08/2022   Influenza-Unspecified 10/17/2021   PNEUMOCOCCAL CONJUGATE-20 10/17/2021   Tdap 11/18/2021   Zoster Recombinat (Shingrix) 01/06/2022    Past Medical History:  Diagnosis Date   Asthma    Neuropathy     Tobacco History: Social History   Tobacco Use  Smoking Status Every Day   Packs/day: 0.50   Years: 30.00   Additional pack years: 0.00   Total pack years: 15.00   Types: Cigarettes   Last attempt to quit: 08/13/2020   Years since quitting: 1.8   Passive exposure: Current  Smokeless Tobacco Never   Ready to quit: Not Answered Counseling given: Not Answered   Outpatient Encounter Medications as of 06/08/2022  Medication Sig   albuterol (PROVENTIL) (2.5 MG/3ML) 0.083% nebulizer solution Take 3 mLs (2.5 mg total) by nebulization every 6 (six) hours as needed for wheezing or shortness of breath.   bisoprolol (ZEBETA) 5 MG tablet TAKE 1/2 TABLET(2.5 MG) BY MOUTH DAILY   dapagliflozin propanediol (FARXIGA) 10 MG TABS tablet Take 1 tablet (10 mg total) by mouth daily before breakfast.   emtricitabine-tenofovir AF (DESCOVY) 200-25  MG tablet Take 1 tablet by mouth daily.   fexofenadine (ALLEGRA) 180 MG tablet TAKE 1 TABLET(180 MG) BY MOUTH DAILY   potassium chloride SA (KLOR-CON M) 20 MEQ tablet Take 1 tablet (20 mEq total) by mouth daily as needed. Take when you take your lasix   sacubitril-valsartan (ENTRESTO) 97-103 MG Take 1 tablet by mouth 2 (two) times daily.   spironolactone (ALDACTONE) 25 MG tablet Take 1 tablet (25 mg total) by mouth daily.   [DISCONTINUED] albuterol (VENTOLIN HFA) 108 (90 Base) MCG/ACT inhaler Inhale 2 puffs into the lungs every 6 (six) hours as needed for wheezing or shortness of breath.   cetirizine (ZYRTEC) 10 MG tablet Take 1 tablet (10 mg total) by mouth daily. (Patient not taking: Reported on 06/08/2022)   fluticasone (FLONASE) 50 MCG/ACT nasal spray Place 2 sprays into both nostrils daily.   furosemide (LASIX) 40 MG tablet Take 1 tablet (40 mg total) by mouth daily as needed.   gabapentin (NEURONTIN) 300 MG capsule Take 2 capsules (600 mg total) by mouth 2 (two) times daily. (Patient not taking: Reported on 06/08/2022)   triamcinolone cream (KENALOG) 0.1 % Apply 1 Application topically 2 (two) times daily. (Patient not taking: Reported on 06/08/2022)   No facility-administered encounter medications on file as of 06/08/2022.     Review of Systems  Review of Systems  Constitutional: Negative.   HENT: Negative.    Cardiovascular: Negative.   Gastrointestinal: Negative.   Allergic/Immunologic: Negative.   Neurological:  Negative.   Psychiatric/Behavioral: Negative.         Physical Exam  BP (!) 87/65   Pulse (!) 101   Temp (!) 97.2 F (36.2 C)   Wt 178 lb 12.8 oz (81.1 kg)   SpO2 95%   BMI 28.64 kg/m   Wt Readings from Last 5 Encounters:  06/08/22 178 lb 12.8 oz (81.1 kg)  02/21/22 198 lb 12.8 oz (90.2 kg)  01/27/22 190 lb (86.2 kg)  01/18/22 190 lb 9.6 oz (86.5 kg)  10/13/21 182 lb 3.2 oz (82.6 kg)     Physical Exam Vitals and nursing note reviewed.  Constitutional:       General: He is not in acute distress.    Appearance: He is well-developed.  Cardiovascular:     Rate and Rhythm: Normal rate and regular rhythm.  Pulmonary:     Effort: Pulmonary effort is normal.     Breath sounds: Normal breath sounds.  Skin:    General: Skin is warm and dry.  Neurological:     Mental Status: He is alert and oriented to person, place, and time.      Lab Results:  CBC    Component Value Date/Time   WBC 10.1 01/27/2022 1119   WBC 9.1 01/20/2022 1639   RBC 4.23 01/27/2022 1119   RBC 3.97 (L) 01/20/2022 1639   HGB 12.8 (L) 01/27/2022 1119   HCT 39.1 01/27/2022 1119   PLT 331 01/27/2022 1119   MCV 92 01/27/2022 1119   MCH 30.3 01/27/2022 1119   MCH 31.7 01/20/2022 1639   MCHC 32.7 01/27/2022 1119   MCHC 33.3 01/20/2022 1639   RDW 13.6 01/27/2022 1119   LYMPHSABS 1.3 06/14/2021 1743   LYMPHSABS 2.1 09/13/2020 0942   MONOABS 1.5 (H) 06/14/2021 1743   EOSABS 0.0 06/14/2021 1743   EOSABS 0.1 09/13/2020 0942   BASOSABS 0.0 06/14/2021 1743   BASOSABS 0.0 09/13/2020 0942    BMET    Component Value Date/Time   NA 137 02/21/2022 1041   NA 140 01/27/2022 1119   K 4.3 02/21/2022 1041   CL 100 02/21/2022 1041   CO2 29 02/21/2022 1041   GLUCOSE 85 02/21/2022 1041   BUN 15 02/21/2022 1041   BUN 14 01/27/2022 1119   CREATININE 0.92 02/21/2022 1041   CREATININE 1.01 02/21/2021 1024   CALCIUM 9.1 02/21/2022 1041   GFRNONAA >60 02/21/2022 1041    BNP    Component Value Date/Time   BNP 6.1 02/21/2022 1041     Assessment & Plan:   Screen for STD (sexually transmitted disease) - RPR; Future  2. Low testosterone  - Testosterone; Future  Follow up:  Follow up as scheduled     Ivonne Andrew, NP 06/12/2022

## 2022-06-08 NOTE — Progress Notes (Signed)
   [facility] Pharmacy Vaccination Visit  HPI: Caleb Landry is a 52 y.o. male who presents to the [facility] pharmacy clinic for vaccine administration.  Hepatitis B Lab Results  Component Value Date   HEPBSAB NON-REACTIVE 01/19/2022   Lab Results  Component Value Date   HEPBSAG NON-REACTIVE 01/19/2022    Hepatitis C No results found for: "HCVAB"  Hepatitis A Lab Results  Component Value Date   HAV NON-REACTIVE 01/19/2022    Assessment & Plan: - Administered final HBV vaccine - Patient tolerated well - Next PrEP appointment on 7/17 - States he has some blurry vision when working outside and exerting himself; needs to f/u with cardiology   [name], PharmD, CPP, BCIDP, AAHIVP Clinical Pharmacist Practitioner Infectious Diseases Clinical Pharmacist [facility] 06/08/2022, 3:11 PM

## 2022-06-09 ENCOUNTER — Other Ambulatory Visit: Payer: Self-pay | Admitting: Nurse Practitioner

## 2022-06-09 ENCOUNTER — Other Ambulatory Visit: Payer: BLUE CROSS/BLUE SHIELD

## 2022-06-09 DIAGNOSIS — Z113 Encounter for screening for infections with a predominantly sexual mode of transmission: Secondary | ICD-10-CM

## 2022-06-09 DIAGNOSIS — R7989 Other specified abnormal findings of blood chemistry: Secondary | ICD-10-CM

## 2022-06-09 DIAGNOSIS — J4520 Mild intermittent asthma, uncomplicated: Secondary | ICD-10-CM

## 2022-06-09 NOTE — Telephone Encounter (Signed)
Please advise Kh 

## 2022-06-10 LAB — TESTOSTERONE: Testosterone: 679 ng/dL (ref 264–916)

## 2022-06-10 LAB — RPR: RPR Ser Ql: NONREACTIVE

## 2022-06-12 ENCOUNTER — Other Ambulatory Visit: Payer: Self-pay | Admitting: Nurse Practitioner

## 2022-06-12 ENCOUNTER — Encounter: Payer: Self-pay | Admitting: Nurse Practitioner

## 2022-06-12 ENCOUNTER — Other Ambulatory Visit (HOSPITAL_COMMUNITY): Payer: Self-pay

## 2022-06-12 ENCOUNTER — Other Ambulatory Visit: Payer: Self-pay | Admitting: Pharmacist

## 2022-06-12 DIAGNOSIS — R7989 Other specified abnormal findings of blood chemistry: Secondary | ICD-10-CM

## 2022-06-12 DIAGNOSIS — Z113 Encounter for screening for infections with a predominantly sexual mode of transmission: Secondary | ICD-10-CM | POA: Insufficient documentation

## 2022-06-12 DIAGNOSIS — Z79899 Other long term (current) drug therapy: Secondary | ICD-10-CM

## 2022-06-12 NOTE — Assessment & Plan Note (Signed)
-   RPR; Future  2. Low testosterone  - Testosterone; Future  Follow up:  Follow up as scheduled

## 2022-06-12 NOTE — Patient Instructions (Signed)
1. Screen for STD (sexually transmitted disease)  - RPR; Future  2. Low testosterone  - Testosterone; Future  Follow up:  Follow up as scheduled

## 2022-07-19 ENCOUNTER — Ambulatory Visit: Payer: Self-pay | Admitting: Nurse Practitioner

## 2022-07-19 ENCOUNTER — Ambulatory Visit: Payer: BLUE CROSS/BLUE SHIELD | Admitting: Pharmacist

## 2022-07-28 ENCOUNTER — Ambulatory Visit: Payer: Self-pay | Admitting: Nurse Practitioner

## 2022-08-19 ENCOUNTER — Other Ambulatory Visit: Payer: Self-pay | Admitting: Pharmacist

## 2022-08-19 DIAGNOSIS — Z79899 Other long term (current) drug therapy: Secondary | ICD-10-CM

## 2022-09-11 ENCOUNTER — Ambulatory Visit: Payer: Self-pay | Admitting: Nurse Practitioner

## 2022-09-21 ENCOUNTER — Other Ambulatory Visit: Payer: Self-pay | Admitting: Nurse Practitioner

## 2022-09-21 DIAGNOSIS — J4520 Mild intermittent asthma, uncomplicated: Secondary | ICD-10-CM

## 2022-10-03 ENCOUNTER — Other Ambulatory Visit: Payer: Self-pay | Admitting: Pharmacist

## 2022-10-03 DIAGNOSIS — Z79899 Other long term (current) drug therapy: Secondary | ICD-10-CM

## 2022-10-25 ENCOUNTER — Emergency Department (HOSPITAL_COMMUNITY)
Admission: EM | Admit: 2022-10-25 | Discharge: 2022-10-26 | Disposition: A | Discharge status: Home / Self Care | Payer: BLUE CROSS/BLUE SHIELD | Source: Home / Self Care | Attending: Emergency Medicine | Admitting: Emergency Medicine

## 2022-10-25 ENCOUNTER — Encounter (HOSPITAL_COMMUNITY): Payer: Self-pay | Admitting: *Deleted

## 2022-10-25 ENCOUNTER — Other Ambulatory Visit: Payer: Self-pay

## 2022-10-25 DIAGNOSIS — L02511 Cutaneous abscess of right hand: Secondary | ICD-10-CM | POA: Insufficient documentation

## 2022-10-25 DIAGNOSIS — N492 Inflammatory disorders of scrotum: Secondary | ICD-10-CM | POA: Insufficient documentation

## 2022-10-25 LAB — URINALYSIS, ROUTINE W REFLEX MICROSCOPIC
Bacteria, UA: NONE SEEN
Bilirubin Urine: NEGATIVE
Glucose, UA: >=500 mg/dL — AB
Hgb urine dipstick: NEGATIVE
Ketones, ur: NEGATIVE mg/dL
Leukocytes,Ua: NEGATIVE
Nitrite: NEGATIVE
Protein, ur: NEGATIVE mg/dL
Specific Gravity, Urine: 1.039 — ABNORMAL HIGH (ref 1.005–1.030)
pH: 5 (ref 5.0–8.0)

## 2022-10-25 LAB — CBC
HCT: 39.7 % (ref 39.0–52.0)
Hemoglobin: 13.4 g/dL (ref 13.0–17.0)
MCH: 32.4 pg (ref 26.0–34.0)
MCHC: 33.8 g/dL (ref 30.0–36.0)
MCV: 96.1 fL (ref 80.0–100.0)
Platelets: 357 10*3/uL (ref 150–400)
RBC: 4.13 MIL/uL — ABNORMAL LOW (ref 4.22–5.81)
RDW: 12.4 % (ref 11.5–15.5)
WBC: 14.5 10*3/uL — ABNORMAL HIGH (ref 4.0–10.5)
nRBC: 0 % (ref 0.0–0.2)

## 2022-10-25 LAB — COMPREHENSIVE METABOLIC PANEL
ALT: 17 U/L (ref 0–44)
AST: 22 U/L (ref 15–41)
Albumin: 3.7 g/dL (ref 3.5–5.0)
Alkaline Phosphatase: 89 U/L (ref 38–126)
Anion gap: 9 (ref 5–15)
BUN: 6 mg/dL (ref 6–20)
CO2: 30 mmol/L (ref 22–32)
Calcium: 9.2 mg/dL (ref 8.9–10.3)
Chloride: 101 mmol/L (ref 98–111)
Creatinine, Ser: 0.78 mg/dL (ref 0.61–1.24)
GFR, Estimated: >60 mL/min (ref >60)
Glucose, Bld: 112 mg/dL — ABNORMAL HIGH (ref 70–99)
Potassium: 3.7 mmol/L (ref 3.5–5.1)
Sodium: 140 mmol/L (ref 135–145)
Total Bilirubin: 0.7 mg/dL (ref 0.3–1.2)
Total Protein: 7.1 g/dL (ref 6.5–8.1)

## 2022-10-25 MED ORDER — SODIUM CHLORIDE 0.9 % IV SOLN
2.0000 g | Freq: Once | INTRAVENOUS | Status: AC
Start: 2022-10-26 — End: 2022-10-26
  Administered 2022-10-26: 2 g via INTRAVENOUS
  Filled 2022-10-25: qty 20

## 2022-10-25 NOTE — ED Triage Notes (Signed)
The pt reports that he has had pain in his groin and on his scrotum for 3-4 days and he reports that is is draining  no temp

## 2022-10-25 NOTE — ED Provider Notes (Signed)
[place] EMERGENCY DEPARTMENT AT [facility] Provider Note   CSN: [id number] Arrival date & time: 10/25/22  1854     History {Add pertinent medical, surgical, social history, OB history to HPI:1} Chief Complaint  Patient presents with   Abscess    Caleb Landry is a 52 y.o. male.  Patient presents to the emergency department for evaluation of skin infections.  Patient reports an area on his right middle finger that is red, swollen and draining.  He also is experiencing swelling and drainage from his scrotum.  Symptoms ongoing for 3 or 4 days.       Home Medications Prior to Admission medications   Medication Sig Start Date End Date Taking? Authorizing Provider  albuterol (PROVENTIL) (2.5 MG/3ML) 0.083% nebulizer solution Take 3 mLs (2.5 mg total) by nebulization every 6 (six) hours as needed for wheezing or shortness of breath. 03/20/22   Ivonne Andrew, NP  albuterol (VENTOLIN HFA) 108 (90 Base) MCG/ACT inhaler INHALE 2 PUFFS INTO THE LUNGS EVERY 6 HOURS AS NEEDED FOR WHEEZING OR SHORTNESS OF BREATH 09/21/22   Ivonne Andrew, NP  bisoprolol (ZEBETA) 5 MG tablet TAKE 1/2 TABLET(2.5 MG) BY MOUTH DAILY 01/18/22   Ivonne Andrew, NP  cetirizine (ZYRTEC) 10 MG tablet Take 1 tablet (10 mg total) by mouth daily. Patient not taking: Reported on 06/08/2022 01/18/22   Ivonne Andrew, NP  dapagliflozin propanediol (FARXIGA) 10 MG TABS tablet Take 1 tablet (10 mg total) by mouth daily before breakfast. 01/18/22   Ivonne Andrew, NP  emtricitabine-tenofovir AF (DESCOVY) 200-25 MG tablet Take 1 tablet by mouth daily. 04/19/22   Kuppelweiser, Cassie L, RPH-CPP  fexofenadine (ALLEGRA) 180 MG tablet TAKE 1 TABLET(180 MG) BY MOUTH DAILY 03/20/22   Ivonne Andrew, NP  fluticasone (FLONASE) 50 MCG/ACT nasal spray Place 2 sprays into both nostrils daily. 03/20/22   Ivonne Andrew, NP  furosemide (LASIX) 40 MG tablet Take 1 tablet (40 mg total) by mouth daily as needed. 01/18/22    Ivonne Andrew, NP  gabapentin (NEURONTIN) 300 MG capsule Take 2 capsules (600 mg total) by mouth 2 (two) times daily. Patient not taking: Reported on 06/08/2022 01/18/22 07/17/22  Ivonne Andrew, NP  potassium chloride SA (KLOR-CON M) 20 MEQ tablet Take 1 tablet (20 mEq total) by mouth daily as needed. Take when you take your lasix 01/18/22   Ivonne Andrew, NP  sacubitril-valsartan (ENTRESTO) 97-103 MG Take 1 tablet by mouth 2 (two) times daily. 03/20/22   Ivonne Andrew, NP  spironolactone (ALDACTONE) 25 MG tablet Take 1 tablet (25 mg total) by mouth daily. 01/18/22   Ivonne Andrew, NP  triamcinolone cream (KENALOG) 0.1 % Apply 1 Application topically 2 (two) times daily. Patient not taking: Reported on 06/08/2022 01/18/22   Ivonne Andrew, NP      Allergies    Penicillins    Review of Systems   Review of Systems  Physical Exam Updated Vital Signs BP (!) 148/95 (BP Location: Right Arm)   Pulse 93   Temp 98.2 F (36.8 C) (Oral)   Resp 20   Ht 5\' 6"  (1.676 m)   Wt 81.1 kg   SpO2 100%   BMI 28.86 kg/m  Physical Exam Vitals and nursing note reviewed.  Constitutional:      General: He is not in acute distress.    Appearance: He is well-developed.  HENT:     Head: Normocephalic and atraumatic.  Mouth/Throat:     Mouth: Mucous membranes are moist.  Eyes:     General: Vision grossly intact. Gaze aligned appropriately.     Extraocular Movements: Extraocular movements intact.     Conjunctiva/sclera: Conjunctivae normal.  Cardiovascular:     Rate and Rhythm: Normal rate and regular rhythm.     Pulses: Normal pulses.     Heart sounds: Normal heart sounds, S1 normal and S2 normal. No murmur heard.    No friction rub. No gallop.  Pulmonary:     Effort: Pulmonary effort is normal. No respiratory distress.     Breath sounds: Normal breath sounds.  Abdominal:     Palpations: Abdomen is soft.     Tenderness: There is no abdominal tenderness. There is no guarding or rebound.      Hernia: No hernia is present.  Genitourinary:    Comments: Scrotal erythema and induration with draining area posterior Musculoskeletal:        General: No swelling.     Cervical back: Full passive range of motion without pain, normal range of motion and neck supple. No pain with movement, spinous process tenderness or muscular tenderness. Normal range of motion.     Right lower leg: No edema.     Left lower leg: No edema.  Skin:    General: Skin is warm and dry.     Capillary Refill: Capillary refill takes less than 2 seconds.     Findings: Abscess (pustule volar aspect prox phalynx right third finger; scrotum) and erythema present. No ecchymosis, lesion or wound. Abrasion: pustule volar aspect prox phalynx right third finger. Neurological:     Mental Status: He is alert and oriented to person, place, and time.     GCS: GCS eye subscore is 4. GCS verbal subscore is 5. GCS motor subscore is 6.     Cranial Nerves: Cranial nerves 2-12 are intact.     Sensory: Sensation is intact.     Motor: Motor function is intact. No weakness or abnormal muscle tone.     Coordination: Coordination is intact.  Psychiatric:        Mood and Affect: Mood normal.        Speech: Speech normal.        Behavior: Behavior normal.     ED Results / Procedures / Treatments   Labs (all labs ordered are listed, but only abnormal results are displayed) Labs Reviewed  COMPREHENSIVE METABOLIC PANEL - Abnormal; Notable for the following components:      Result Value   Glucose, Bld 112 (*)    All other components within normal limits  CBC - Abnormal; Notable for the following components:   WBC 14.5 (*)    RBC 4.13 (*)    All other components within normal limits  URINALYSIS, ROUTINE W REFLEX MICROSCOPIC - Abnormal; Notable for the following components:   Specific Gravity, Urine 1.039 (*)    Glucose, UA >=500 (*)    All other components within normal limits    EKG None  Radiology No results  found.  Procedures Procedures  {Document cardiac monitor, telemetry assessment procedure when appropriate:1}  Medications Ordered in ED Medications - No data to display  ED Course/ Medical Decision Making/ A&P   {   Click here for ABCD2, HEART and other calculatorsREFRESH Note before signing :1}  Medical Decision Making  ***  {Document critical care time when appropriate:1} {Document review of labs and clinical decision tools ie heart score, Chads2Vasc2 etc:1}  {Document your independent review of radiology images, and any outside records:1} {Document your discussion with family members, caretakers, and with consultants:1} {Document social determinants of health affecting pt's care:1} {Document your decision making why or why not admission, treatments were needed:1} Final Clinical Impression(s) / ED Diagnoses Final diagnoses:  None    Rx / DC Orders ED Discharge Orders     None

## 2022-10-26 ENCOUNTER — Emergency Department (HOSPITAL_COMMUNITY): Payer: BLUE CROSS/BLUE SHIELD

## 2022-10-26 LAB — PROTIME-INR
INR: 0.9 (ref 0.8–1.2)
Prothrombin Time: 12.3 s (ref 11.4–15.2)

## 2022-10-26 LAB — I-STAT CG4 LACTIC ACID, ED: Lactic Acid, Venous: 1.2 mmol/L (ref 0.5–1.9)

## 2022-10-26 LAB — APTT: aPTT: 27 s (ref 24–36)

## 2022-10-26 MED ORDER — IOHEXOL 350 MG/ML SOLN
75.0000 mL | Freq: Once | INTRAVENOUS | Status: AC | PRN
Start: 2022-10-26 — End: 2022-10-26
  Administered 2022-10-26: 75 mL via INTRAVENOUS

## 2022-10-26 MED ORDER — DOXYCYCLINE HYCLATE 100 MG PO CAPS: 100.0000 mg | ORAL_CAPSULE | Freq: Two times a day (BID) | ORAL | 0 refills | Status: DC

## 2022-10-26 MED ORDER — VANCOMYCIN HCL IN DEXTROSE 1-5 GM/200ML-% IV SOLN
1000.0000 mg | Freq: Once | INTRAVENOUS | Status: AC
  Administered 2022-10-26: 1000 mg via INTRAVENOUS
  Filled 2022-10-26: qty 200

## 2022-10-26 MED ORDER — SULFAMETHOXAZOLE-TRIMETHOPRIM 800-160 MG PO TABS: 1.0000 | ORAL_TABLET | Freq: Two times a day (BID) | ORAL | 0 refills | Status: DC

## 2022-10-26 MED ORDER — LIDOCAINE HCL (PF) 1 % IJ SOLN
30.0000 mL | Freq: Once | INTRAMUSCULAR | Status: AC
  Administered 2022-10-26: 30 mL
  Filled 2022-10-26: qty 30

## 2022-10-26 NOTE — Progress Notes (Signed)
ED Pharmacy Antibiotic Sign Off An antibiotic consult was received from an ED provider for vancomyicn per pharmacy dosing for cellulitis. A chart review was completed to assess appropriateness.   The following one time order(s) were placed:  Vancomycin 2000mg  x 1   Further antibiotic and/or antibiotic pharmacy consults should be ordered by the admitting provider if indicated.   Thank you for allowing pharmacy to be a part of this patient's care.   Marja Kays, [facility]  Clinical Pharmacist 10/26/22 12:16 AM

## 2022-10-27 ENCOUNTER — Other Ambulatory Visit: Payer: Self-pay

## 2022-10-27 ENCOUNTER — Inpatient Hospital Stay (HOSPITAL_COMMUNITY)
Admission: EM | Admit: 2022-10-27 | Discharge: 2022-10-29 | DRG: 728 | Disposition: A | Discharge status: Home / Self Care | Payer: BLUE CROSS/BLUE SHIELD | Attending: Internal Medicine | Admitting: Internal Medicine

## 2022-10-27 ENCOUNTER — Emergency Department (HOSPITAL_COMMUNITY): Payer: BLUE CROSS/BLUE SHIELD

## 2022-10-27 ENCOUNTER — Encounter (HOSPITAL_COMMUNITY): Payer: Self-pay

## 2022-10-27 DIAGNOSIS — Z8379 Family history of other diseases of the digestive system: Secondary | ICD-10-CM

## 2022-10-27 DIAGNOSIS — N492 Inflammatory disorders of scrotum: Secondary | ICD-10-CM | POA: Diagnosis not present

## 2022-10-27 DIAGNOSIS — G629 Polyneuropathy, unspecified: Secondary | ICD-10-CM | POA: Diagnosis present

## 2022-10-27 DIAGNOSIS — Z9889 Other specified postprocedural states: Secondary | ICD-10-CM | POA: Diagnosis not present

## 2022-10-27 DIAGNOSIS — I5022 Chronic systolic (congestive) heart failure: Secondary | ICD-10-CM | POA: Diagnosis present

## 2022-10-27 DIAGNOSIS — J45909 Unspecified asthma, uncomplicated: Secondary | ICD-10-CM | POA: Diagnosis present

## 2022-10-27 DIAGNOSIS — Z9049 Acquired absence of other specified parts of digestive tract: Secondary | ICD-10-CM

## 2022-10-27 DIAGNOSIS — D72829 Elevated white blood cell count, unspecified: Secondary | ICD-10-CM | POA: Diagnosis present

## 2022-10-27 DIAGNOSIS — Z79899 Other long term (current) drug therapy: Secondary | ICD-10-CM | POA: Diagnosis not present

## 2022-10-27 DIAGNOSIS — K5730 Diverticulosis of large intestine without perforation or abscess without bleeding: Secondary | ICD-10-CM | POA: Diagnosis present

## 2022-10-27 DIAGNOSIS — F17210 Nicotine dependence, cigarettes, uncomplicated: Secondary | ICD-10-CM | POA: Diagnosis present

## 2022-10-27 DIAGNOSIS — L03011 Cellulitis of right finger: Secondary | ICD-10-CM | POA: Diagnosis present

## 2022-10-27 DIAGNOSIS — L02511 Cutaneous abscess of right hand: Secondary | ICD-10-CM | POA: Diagnosis present

## 2022-10-27 DIAGNOSIS — Z880 Allergy status to penicillin: Secondary | ICD-10-CM

## 2022-10-27 DIAGNOSIS — E876 Hypokalemia: Secondary | ICD-10-CM | POA: Diagnosis present

## 2022-10-27 LAB — CBC WITH DIFFERENTIAL/PLATELET
Abs Immature Granulocytes: 0.06 10*3/uL (ref 0.00–0.07)
Basophils Absolute: 0.1 10*3/uL (ref 0.0–0.1)
Basophils Relative: 1 %
Eosinophils Absolute: 0 10*3/uL (ref 0.0–0.5)
Eosinophils Relative: 0 %
HCT: 39.9 % (ref 39.0–52.0)
Hemoglobin: 13 g/dL (ref 13.0–17.0)
Immature Granulocytes: 1 %
Lymphocytes Relative: 19 %
Lymphs Abs: 1.6 10*3/uL (ref 0.7–4.0)
MCH: 31.3 pg (ref 26.0–34.0)
MCHC: 32.6 g/dL (ref 30.0–36.0)
MCV: 96.1 fL (ref 80.0–100.0)
Monocytes Absolute: 0.6 10*3/uL (ref 0.1–1.0)
Monocytes Relative: 7 %
Neutro Abs: 6 10*3/uL (ref 1.7–7.7)
Neutrophils Relative %: 72 %
Platelets: 332 10*3/uL (ref 150–400)
RBC: 4.15 MIL/uL — ABNORMAL LOW (ref 4.22–5.81)
RDW: 12.1 % (ref 11.5–15.5)
WBC: 8.4 10*3/uL (ref 4.0–10.5)
nRBC: 0 % (ref 0.0–0.2)

## 2022-10-27 LAB — URINE CULTURE: Culture: <10000 — AB

## 2022-10-27 LAB — COMPREHENSIVE METABOLIC PANEL
ALT: 15 U/L (ref 0–44)
AST: 20 U/L (ref 15–41)
Albumin: 3.5 g/dL (ref 3.5–5.0)
Alkaline Phosphatase: 73 U/L (ref 38–126)
Anion gap: 16 — ABNORMAL HIGH (ref 5–15)
BUN: 8 mg/dL (ref 6–20)
CO2: 24 mmol/L (ref 22–32)
Calcium: 9 mg/dL (ref 8.9–10.3)
Chloride: 99 mmol/L (ref 98–111)
Creatinine, Ser: 1.13 mg/dL (ref 0.61–1.24)
GFR, Estimated: >60 mL/min (ref >60)
Glucose, Bld: 146 mg/dL — ABNORMAL HIGH (ref 70–99)
Potassium: 2.9 mmol/L — ABNORMAL LOW (ref 3.5–5.1)
Sodium: 139 mmol/L (ref 135–145)
Total Bilirubin: 0.4 mg/dL (ref 0.3–1.2)
Total Protein: 7 g/dL (ref 6.5–8.1)

## 2022-10-27 LAB — I-STAT CG4 LACTIC ACID, ED
Lactic Acid, Venous: 4.9 mmol/L (ref 0.5–1.9)
Lactic Acid, Venous: 3.8 mmol/L (ref 0.5–1.9)

## 2022-10-27 LAB — MAGNESIUM: Magnesium: 1.9 mg/dL (ref 1.7–2.4)

## 2022-10-27 MED ORDER — ALBUTEROL SULFATE (2.5 MG/3ML) 0.083% IN NEBU: 2.5000 mg | INHALATION_SOLUTION | Freq: Four times a day (QID) | RESPIRATORY_TRACT | Status: DC | PRN

## 2022-10-27 MED ORDER — ACETAMINOPHEN 650 MG RE SUPP: 650.0000 mg | Freq: Four times a day (QID) | RECTAL | Status: DC | PRN

## 2022-10-27 MED ORDER — LACTATED RINGERS IV BOLUS
1000.0000 mL | Freq: Once | INTRAVENOUS | Status: AC
  Administered 2022-10-27: 1000 mL via INTRAVENOUS

## 2022-10-27 MED ORDER — CLINDAMYCIN PHOSPHATE 600 MG/50ML IV SOLN
600.0000 mg | Freq: Once | INTRAVENOUS | Status: AC
  Administered 2022-10-27: 600 mg via INTRAVENOUS
  Filled 2022-10-27: qty 50

## 2022-10-27 MED ORDER — ONDANSETRON HCL 4 MG PO TABS: 4.0000 mg | ORAL_TABLET | Freq: Four times a day (QID) | ORAL | Status: DC | PRN

## 2022-10-27 MED ORDER — SENNOSIDES-DOCUSATE SODIUM 8.6-50 MG PO TABS
1.0000 | ORAL_TABLET | Freq: Every evening | ORAL | Status: DC | PRN
Start: 2022-10-27 — End: 2022-10-29

## 2022-10-27 MED ORDER — ENOXAPARIN SODIUM 40 MG/0.4ML IJ SOSY
40.0000 mg | PREFILLED_SYRINGE | INTRAMUSCULAR | Status: DC
  Administered 2022-10-28: 40 mg via SUBCUTANEOUS
  Filled 2022-10-27: qty 0.4

## 2022-10-27 MED ORDER — SODIUM CHLORIDE 0.9 % IV SOLN
2.0000 g | INTRAVENOUS | Status: DC
  Administered 2022-10-27 – 2022-10-28 (×2): 2 g via INTRAVENOUS
  Filled 2022-10-27 (×2): qty 20

## 2022-10-27 MED ORDER — SODIUM CHLORIDE 0.9 % IV SOLN
2.0000 g | Freq: Three times a day (TID) | INTRAVENOUS | Status: DC
  Administered 2022-10-27: 2 g via INTRAVENOUS
  Filled 2022-10-27: qty 12.5

## 2022-10-27 MED ORDER — POTASSIUM CHLORIDE 20 MEQ PO PACK: 60.0000 meq | PACK | Freq: Once | ORAL | Status: DC

## 2022-10-27 MED ORDER — LACTATED RINGERS IV BOLUS
1500.0000 mL | Freq: Once | INTRAVENOUS | Status: AC
  Administered 2022-10-27: 1500 mL via INTRAVENOUS

## 2022-10-27 MED ORDER — POTASSIUM CHLORIDE 20 MEQ PO PACK
80.0000 meq | PACK | Freq: Once | ORAL | Status: AC
  Administered 2022-10-27: 80 meq via ORAL
  Filled 2022-10-27: qty 4

## 2022-10-27 MED ORDER — ACETAMINOPHEN 325 MG PO TABS: 650.0000 mg | ORAL_TABLET | Freq: Four times a day (QID) | ORAL | Status: DC | PRN

## 2022-10-27 MED ORDER — ONDANSETRON HCL 4 MG/2ML IJ SOLN: 4.0000 mg | Freq: Four times a day (QID) | INTRAMUSCULAR | Status: DC | PRN

## 2022-10-27 MED ORDER — VANCOMYCIN HCL 1500 MG/300ML IV SOLN
1500.0000 mg | Freq: Once | INTRAVENOUS | Status: AC
  Administered 2022-10-27: 1500 mg via INTRAVENOUS
  Filled 2022-10-27: qty 300

## 2022-10-27 MED ORDER — DAPAGLIFLOZIN PROPANEDIOL 10 MG PO TABS
10.0000 mg | ORAL_TABLET | Freq: Every day | ORAL | Status: DC
  Filled 2022-10-27: qty 1

## 2022-10-27 MED ORDER — IOHEXOL 350 MG/ML SOLN
75.0000 mL | Freq: Once | INTRAVENOUS | Status: AC | PRN
  Administered 2022-10-27: 75 mL via INTRAVENOUS

## 2022-10-27 MED ORDER — SACUBITRIL-VALSARTAN 97-103 MG PO TABS
1.0000 | ORAL_TABLET | Freq: Two times a day (BID) | ORAL | Status: DC
  Administered 2022-10-27 – 2022-10-29 (×4): 1 via ORAL
  Filled 2022-10-27 (×6): qty 1

## 2022-10-27 MED ORDER — VANCOMYCIN HCL IN DEXTROSE 1-5 GM/200ML-% IV SOLN
1000.0000 mg | Freq: Two times a day (BID) | INTRAVENOUS | Status: DC
  Administered 2022-10-28: 1000 mg via INTRAVENOUS
  Filled 2022-10-27: qty 200

## 2022-10-27 NOTE — ED Triage Notes (Signed)
Pt seen 2 days ago and was asked to stay but was unable to stay to be admitted that day. Pt was diagnosed with abscess in scrotum. Pt is taking PO antibiotics. Pt states he still has drainage and foul odor from site.

## 2022-10-27 NOTE — ED Provider Notes (Signed)
[place] EMERGENCY DEPARTMENT AT [facility] Provider Note   CSN: [id number] Arrival date & time: 10/27/22  1427     History  Chief Complaint  Patient presents with   Abscess    Caleb Landry is a 52 y.o. male. He was seen on 10/23 here for a R middle finger abscess that was I&D'd and scrotal cellulitis. CT scrotum no nec fasc or deep tissue infection. Given Vanc, CTX, offered admission and declined. Blood culture x2 was taken at that time and so far NG x1d. Discharged on Bactrim. Has had persistent drainage and foul odor from scrotum since.  No fevers, chills, chest pain, shortness of breath, lightheadedness, cough, URI symptoms, abdominal pain, N/V/D, dysuria.   The history is provided by the patient and medical records.       Home Medications Prior to Admission medications   Medication Sig Start Date End Date Taking? Authorizing Provider  albuterol (PROVENTIL) (2.5 MG/3ML) 0.083% nebulizer solution Take 3 mLs (2.5 mg total) by nebulization every 6 (six) hours as needed for wheezing or shortness of breath. 03/20/22   Ivonne Andrew, NP  albuterol (VENTOLIN HFA) 108 (90 Base) MCG/ACT inhaler INHALE 2 PUFFS INTO THE LUNGS EVERY 6 HOURS AS NEEDED FOR WHEEZING OR SHORTNESS OF BREATH 09/21/22   Ivonne Andrew, NP  bisoprolol (ZEBETA) 5 MG tablet TAKE 1/2 TABLET(2.5 MG) BY MOUTH DAILY 01/18/22   Ivonne Andrew, NP  cetirizine (ZYRTEC) 10 MG tablet Take 1 tablet (10 mg total) by mouth daily. Patient not taking: Reported on 06/08/2022 01/18/22   Ivonne Andrew, NP  dapagliflozin propanediol (FARXIGA) 10 MG TABS tablet Take 1 tablet (10 mg total) by mouth daily before breakfast. 01/18/22   Ivonne Andrew, NP  doxycycline (VIBRAMYCIN) 100 MG capsule Take 1 capsule (100 mg total) by mouth 2 (two) times daily. 10/26/22   Gilda Crease, MD  emtricitabine-tenofovir AF (DESCOVY) 200-25 MG tablet Take 1 tablet by mouth daily. 04/19/22   Kuppelweiser, Cassie L, RPH-CPP   fexofenadine (ALLEGRA) 180 MG tablet TAKE 1 TABLET(180 MG) BY MOUTH DAILY 03/20/22   Ivonne Andrew, NP  fluticasone (FLONASE) 50 MCG/ACT nasal spray Place 2 sprays into both nostrils daily. 03/20/22   Ivonne Andrew, NP  furosemide (LASIX) 40 MG tablet Take 1 tablet (40 mg total) by mouth daily as needed. 01/18/22   Ivonne Andrew, NP  gabapentin (NEURONTIN) 300 MG capsule Take 2 capsules (600 mg total) by mouth 2 (two) times daily. Patient not taking: Reported on 06/08/2022 01/18/22 07/17/22  Ivonne Andrew, NP  potassium chloride SA (KLOR-CON M) 20 MEQ tablet Take 1 tablet (20 mEq total) by mouth daily as needed. Take when you take your lasix 01/18/22   Ivonne Andrew, NP  sacubitril-valsartan (ENTRESTO) 97-103 MG Take 1 tablet by mouth 2 (two) times daily. 03/20/22   Ivonne Andrew, NP  spironolactone (ALDACTONE) 25 MG tablet Take 1 tablet (25 mg total) by mouth daily. 01/18/22   Ivonne Andrew, NP  sulfamethoxazole-trimethoprim (BACTRIM DS) 800-160 MG tablet Take 1 tablet by mouth 2 (two) times daily. 10/26/22   Gilda Crease, MD  triamcinolone cream (KENALOG) 0.1 % Apply 1 Application topically 2 (two) times daily. Patient not taking: Reported on 06/08/2022 01/18/22   Ivonne Andrew, NP      Allergies    Penicillins    Review of Systems   Review of Systems as per HPI  Physical Exam Updated Vital Signs BP Marland Kitchen)  156/116 (BP Location: Right Arm)   Pulse (!) 131   Temp 98 F (36.7 C)   Resp 20   Ht 5\' 10"  (1.778 m)   Wt 81.6 kg   SpO2 98%   BMI 25.81 kg/m  Physical Exam Constitutional:      General: He is not in acute distress.    Appearance: Normal appearance. He is ill-appearing. He is not toxic-appearing.  HENT:     Head: Normocephalic and atraumatic.     Nose: Nose normal.     Mouth/Throat:     Mouth: Mucous membranes are moist.  Eyes:     Extraocular Movements: Extraocular movements intact.     Pupils: Pupils are equal, round, and reactive to light.   Cardiovascular:     Rate and Rhythm: Regular rhythm. Tachycardia present.     Pulses: Normal pulses.     Heart sounds: Normal heart sounds.  Pulmonary:     Effort: Pulmonary effort is normal.     Breath sounds: Normal breath sounds.  Abdominal:     General: There is no distension.     Palpations: Abdomen is soft.     Tenderness: There is no abdominal tenderness. There is no guarding.  Genitourinary:    Comments: 4 cm abscess to the posterior R scrotum with fluctuance, surrounding erythema. No palpable crepitus. Foul odor noted and mild purulent drainage Musculoskeletal:     Cervical back: Neck supple.     Right lower leg: No edema.     Left lower leg: No edema.     Comments: R middle finger with a well healing wound s/p I&D  Skin:    General: Skin is warm and dry.     Capillary Refill: Capillary refill takes less than 2 seconds.  Neurological:     General: No focal deficit present.     Mental Status: He is alert.     Sensory: No sensory deficit.     Motor: No weakness.     ED Results / Procedures / Treatments   Labs (all labs ordered are listed, but only abnormal results are displayed) Labs Reviewed  I-STAT CG4 LACTIC ACID, ED - Abnormal; Notable for the following components:      Result Value   Lactic Acid, Venous 4.9 (*)    All other components within normal limits  CULTURE, BLOOD (ROUTINE X 2)  CULTURE, BLOOD (ROUTINE X 2)  COMPREHENSIVE METABOLIC PANEL  CBC WITH DIFFERENTIAL/PLATELET  LACTIC ACID, PLASMA    EKG None  Radiology DG Hand Complete Right  Result Date: 10/26/2022 CLINICAL DATA:  Third digit abscess, initial encounter EXAM: RIGHT HAND - COMPLETE 3+ VIEW COMPARISON:  None Available. FINDINGS: Mild soft tissue swelling is noted in the third digit. No bony erosive change is seen. No acute fracture or dislocation is noted. IMPRESSION: Soft tissue swelling proximally without bony abnormality. Electronically Signed   By: Alcide Clever M.D.   On: 10/26/2022  01:41   CT PELVIS W CONTRAST  Result Date: 10/26/2022 CLINICAL DATA:  Soft tissue infection suspected in the pelvis. Scrotal infection. EXAM: CT PELVIS WITH CONTRAST TECHNIQUE: Multidetector CT imaging of the pelvis was performed using the standard protocol following the bolus administration of intravenous contrast. RADIATION DOSE REDUCTION: This exam was performed according to the departmental dose-optimization program which includes automated exposure control, adjustment of the mA and/or kV according to patient size and/or use of iterative reconstruction technique. CONTRAST:  75mL OMNIPAQUE IOHEXOL 350 MG/ML SOLN COMPARISON:  Scrotal ultrasound 06/14/2021 FINDINGS:  Urinary Tract:  No abnormality visualized. Bowel: Diverticulosis of the sigmoid colon without evidence of acute diverticulitis. Visualized small bowel and colon are otherwise unremarkable. Mild groin lymphadenopathy, greater on the right. Largest lymph nodes measure about 1.5 cm short axis dimension. Likely reactive. Vascular/Lymphatic: Iliac and external iliac vascular calcifications are present. No aneurysm. Reproductive: Prostate gland is not enlarged. Scrotal skin thickening is suggested, possibly cellulitis. No soft tissue gas or loculated collections identified. Other:  No free air or free fluid. Musculoskeletal: Degenerative changes in the lower lumbar spine and hips. Compression deformity at L5 with about 40% loss of height, likely chronic. No acute bony abnormality suggested. IMPRESSION: 1. Scrotal skin thickening may indicate edema or cellulitis. No soft tissue gas or abscess to suggest necrotizing fasciitis. 2. Bilateral groin lymphadenopathy is probably reactive. 3. Iliac vascular calcifications consistent with atherosclerosis. Electronically Signed   By: Burman Nieves M.D.   On: 10/26/2022 01:26    Procedures Procedures    Medications Ordered in ED Medications  lactated ringers bolus 1,000 mL (has no administration in time  range)  vancomycin (VANCOREADY) IVPB 1500 mg/300 mL (has no administration in time range)  ceFEPIme (MAXIPIME) 2 g in sodium chloride 0.9 % 100 mL IVPB (has no administration in time range)  lactated ringers bolus 1,500 mL (has no administration in time range)  clindamycin (CLEOCIN) IVPB 600 mg (has no administration in time range)    ED Course/ Medical Decision Making/ A&P Clinical Course as of 10/27/22 1951  Fri Oct 27, 2022  1800 CT pending [CC]  1939 Admit medicine for cellulitis  [CC]  1939 Admit to hosp for cellulitis failed outpatient [GD]    Clinical Course User Index [CC] Glyn Ade, MD [GD] Karmen Stabs, MD                                 Medical Decision Making Amount and/or Complexity of Data Reviewed External Data Reviewed: labs, radiology and notes. Labs: ordered. Decision-making details documented in ED Course. Radiology: ordered and independent interpretation performed. Decision-making details documented in ED Course.  Risk OTC drugs. Prescription drug management. Decision regarding hospitalization.   52 year old male presents with worsening scrotal infection.  Differential considered includes cellulitis, abscess, Fournier gangrene/necrotizing fasciitis. Tachycardic to 131 on arrival, but afebrile and otherwise hemodynamically stable. Exam above most concerning for abscess vs necrotizing infection of scrotum.  Code sepsis activated; sepsis labs and blood cultures obtained and 30 cc/kg fluid given.  Antibiotics included vanc, cefepime, clindamycin to cover potential nec fasc.  CT pelvis/scrotum with IV contrast obtained to further evaluate for abscess vs necrotizing infection. Labs resulted with WBC of 8.4, Hb 13, lactate 4.9 --> 3.8 s/p fluids, metabolic panel with normal renal function and liver function, AG of 16 likely lactic; normal bicarb. CXR independently interpreted by me shows no acute abnormalities. EKG obtained for tachycardia and on my independent  interpretation shows tachycardia rate of 110, normal axis and intervals, no bundle branch block, no ST segment elevations or depressions, no pathologic T wave inversions, signs of arrhythmia or arrhythmia predisposing condition. CT pelvis independently interpreted by me shows cellulitis but no abscess or necrotizing infection to the scrotum.   Requires admission due to failed outpatient management with Bactrim.  Admitted patient to hospitalist for further care. Discussed patient's case with urology prior to admission, they will evaluate him in the morning.         Final Clinical Impression(s) /  ED Diagnoses Final diagnoses:  Scrotal abscess    Rx / DC Orders ED Discharge Orders     None         Karmen Stabs, MD 10/27/22 2110    Glyn Ade, MD 10/27/22 2337

## 2022-10-27 NOTE — ED Notes (Signed)
ED TO INPATIENT HANDOFF REPORT  ED Nurse Name and Phone #:   Theadora Rama RN [phone]  S Name/Age/Gender Caleb Landry 52 y.o. male Room/Bed: 019C/019C  Code Status   Code Status: Prior  Home/SNF/Other Home Patient oriented to: self, place, time, and situation Is this baseline? Yes   Triage Complete: Triage complete  Chief Complaint Cellulitis of scrotum [N49.2]  Triage Note Pt seen 2 days ago and was asked to stay but was unable to stay to be admitted that day. Pt was diagnosed with abscess in scrotum. Pt is taking PO antibiotics. Pt states he still has drainage and foul odor from site.    Allergies Allergies  Allergen Reactions   Penicillins Hives    Level of Care/Admitting Diagnosis ED Disposition     ED Disposition  Admit   Condition  --   Comment  Hospital Area: [facility] [[id number]]  Level of Care: Med-Surg [16]  May admit patient to [facility] or [facility] if equivalent level of care is available:: No  Covid Evaluation: Asymptomatic - no recent exposure (last 10 days) testing not required  Diagnosis: Cellulitis of scrotum [284132]  Admitting Physician: Charlsie Quest [[id number]]  Attending Physician: Charlsie Quest [[id number]]  Certification:: I certify this patient will need inpatient services for at least 2 midnights  Expected Medical Readiness: 10/29/2022          B Medical/Surgery History Past Medical History:  Diagnosis Date   Asthma    Neuropathy    Past Surgical History:  Procedure Laterality Date   APPENDECTOMY     HERNIA REPAIR     RIGHT/LEFT HEART CATH AND CORONARY ANGIOGRAPHY N/A 07/19/2020   Procedure: RIGHT/LEFT HEART CATH AND CORONARY ANGIOGRAPHY;  Surgeon: Dolores Patty, MD;  Location: [facility];  Service: Cardiovascular;  Laterality: N/A;     A IV Location/Drains/Wounds Patient Lines/Drains/Airways Status     Active Line/Drains/Airways     Name Placement date Placement time Site Days    Peripheral IV 10/27/22 18 G Left Antecubital 10/27/22  1528  Antecubital  less than 1            Intake/Output Last 24 hours  Intake/Output Summary (Last 24 hours) at 10/27/2022 2051 Last data filed at 10/27/2022 1812 Gross per 24 hour  Intake 2950 ml  Output 950 ml  Net 2000 ml    Labs/Imaging Results for orders placed or performed during the hospital encounter of 10/27/22 (from the past 48 hour(s))  Comprehensive metabolic panel     Status: Abnormal   Collection Time: 10/27/22  2:51 PM  Result Value Ref Range   Sodium 139 135 - 145 mmol/L   Potassium 2.9 (L) 3.5 - 5.1 mmol/L   Chloride 99 98 - 111 mmol/L   CO2 24 22 - 32 mmol/L   Glucose, Bld 146 (H) 70 - 99 mg/dL    Comment: Glucose reference range applies only to samples taken after fasting for at least 8 hours.   BUN 8 6 - 20 mg/dL   Creatinine, Ser 0.34 0.61 - 1.24 mg/dL   Calcium 9.0 8.9 - 74.2 mg/dL   Total Protein 7.0 6.5 - 8.1 g/dL   Albumin 3.5 3.5 - 5.0 g/dL   AST 20 15 - 41 U/L   ALT 15 0 - 44 U/L   Alkaline Phosphatase 73 38 - 126 U/L   Total Bilirubin 0.4 0.3 - 1.2 mg/dL   GFR, Estimated >59 >56 mL/min  Comment: (NOTE) Calculated using the CKD-EPI Creatinine Equation (2021)    Anion gap 16 (H) 5 - 15    Comment: Performed at [facility], [address], [place], [place] [address]  CBC with Differential     Status: Abnormal   Collection Time: 10/27/22  2:51 PM  Result Value Ref Range   WBC 8.4 4.0 - 10.5 K/uL   RBC 4.15 (L) 4.22 - 5.81 MIL/uL   Hemoglobin 13.0 13.0 - 17.0 g/dL   HCT 96.2 95.2 - 84.1 %   MCV 96.1 80.0 - 100.0 fL   MCH 31.3 26.0 - 34.0 pg   MCHC 32.6 30.0 - 36.0 g/dL   RDW 32.4 40.1 - 02.7 %   Platelets 332 150 - 400 K/uL   nRBC 0.0 0.0 - 0.2 %   Neutrophils Relative % 72 %   Neutro Abs 6.0 1.7 - 7.7 K/uL   Lymphocytes Relative 19 %   Lymphs Abs 1.6 0.7 - 4.0 K/uL   Monocytes Relative 7 %   Monocytes Absolute 0.6 0.1 - 1.0 K/uL   Eosinophils Relative 0 %    Eosinophils Absolute 0.0 0.0 - 0.5 K/uL   Basophils Relative 1 %   Basophils Absolute 0.1 0.0 - 0.1 K/uL   Immature Granulocytes 1 %   Abs Immature Granulocytes 0.06 0.00 - 0.07 K/uL    Comment: Performed at [facility], [address] [place], [place] [address]  I-Stat Lactic Acid, ED     Status: Abnormal   Collection Time: 10/27/22  2:57 PM  Result Value Ref Range   Lactic Acid, Venous 4.9 (HH) 0.5 - 1.9 mmol/L   Comment NOTIFIED PHYSICIAN   I-Stat Lactic Acid, ED     Status: Abnormal   Collection Time: 10/27/22  5:01 PM  Result Value Ref Range   Lactic Acid, Venous 3.8 (HH) 0.5 - 1.9 mmol/L   Comment NOTIFIED PHYSICIAN    CT PELVIS W CONTRAST  Result Date: 10/27/2022 CLINICAL DATA:  Scrotal mass. EXAM: CT PELVIS WITH CONTRAST TECHNIQUE: Multidetector CT imaging of the pelvis was performed using the standard protocol following the bolus administration of intravenous contrast. RADIATION DOSE REDUCTION: This exam was performed according to the departmental dose-optimization program which includes automated exposure control, adjustment of the mA and/or kV according to patient size and/or use of iterative reconstruction technique. CONTRAST:  75mL OMNIPAQUE IOHEXOL 350 MG/ML SOLN COMPARISON:  Pelvis CT 10/26/2022 FINDINGS: Urinary Tract: There is diffuse bladder wall thickening. There is no surrounding inflammation. Bowel: No dilated bowel loops are seen. Colonic diverticula are noted. No focal bowel inflammation. Vascular/Lymphatic: No acute vascular abnormality. There is an enlarged left external iliac chain lymph node measuring 11 mm short axis. There are enlarged bilateral inguinal lymph nodes measuring up to 13 mm short axis. These are similar to prior. Reproductive: Prostate gland is within normal limits. There is scrotal wall edema and inflammatory stranding in the region of the distal inguinal canals, similar to prior. No soft tissue gas identified. Other:  There is no ascites or  focal abdominal wall hernia. Musculoskeletal: No acute osseous abnormality. Stable chronic appearing compression deformity of L4. IMPRESSION: 1. Scrotal wall edema and inflammatory stranding in the region of the distal inguinal canals, similar to prior. Findings may be related to cellulitis. No soft tissue gas identified. 2. Stable enlarged left external iliac chain and bilateral inguinal lymph nodes. 3. Diffuse bladder wall thickening, possibly cystitis. Correlate with urinalysis. 4. Colonic diverticulosis. Electronically Signed  By: Darliss Cheney M.D.   On: 10/27/2022 19:24   DG Chest Portable 1 View  Result Date: 10/27/2022 CLINICAL DATA:  Sepsis. EXAM: PORTABLE CHEST 1 VIEW COMPARISON:  Chest radiograph dated January 20, 2022. FINDINGS: The heart size and mediastinal contours are within normal limits. Both lungs are clear. No pneumothorax or pleural effusion. No acute osseous abnormality. IMPRESSION: No acute cardiopulmonary findings. Electronically Signed   By: Hart Robinsons M.D.   On: 10/27/2022 16:56   DG Hand Complete Right  Result Date: 10/26/2022 CLINICAL DATA:  Third digit abscess, initial encounter EXAM: RIGHT HAND - COMPLETE 3+ VIEW COMPARISON:  None Available. FINDINGS: Mild soft tissue swelling is noted in the third digit. No bony erosive change is seen. No acute fracture or dislocation is noted. IMPRESSION: Soft tissue swelling proximally without bony abnormality. Electronically Signed   By: Alcide Clever M.D.   On: 10/26/2022 01:41   CT PELVIS W CONTRAST  Result Date: 10/26/2022 CLINICAL DATA:  Soft tissue infection suspected in the pelvis. Scrotal infection. EXAM: CT PELVIS WITH CONTRAST TECHNIQUE: Multidetector CT imaging of the pelvis was performed using the standard protocol following the bolus administration of intravenous contrast. RADIATION DOSE REDUCTION: This exam was performed according to the departmental dose-optimization program which includes automated exposure  control, adjustment of the mA and/or kV according to patient size and/or use of iterative reconstruction technique. CONTRAST:  75mL OMNIPAQUE IOHEXOL 350 MG/ML SOLN COMPARISON:  Scrotal ultrasound 06/14/2021 FINDINGS: Urinary Tract:  No abnormality visualized. Bowel: Diverticulosis of the sigmoid colon without evidence of acute diverticulitis. Visualized small bowel and colon are otherwise unremarkable. Mild groin lymphadenopathy, greater on the right. Largest lymph nodes measure about 1.5 cm short axis dimension. Likely reactive. Vascular/Lymphatic: Iliac and external iliac vascular calcifications are present. No aneurysm. Reproductive: Prostate gland is not enlarged. Scrotal skin thickening is suggested, possibly cellulitis. No soft tissue gas or loculated collections identified. Other:  No free air or free fluid. Musculoskeletal: Degenerative changes in the lower lumbar spine and hips. Compression deformity at L5 with about 40% loss of height, likely chronic. No acute bony abnormality suggested. IMPRESSION: 1. Scrotal skin thickening may indicate edema or cellulitis. No soft tissue gas or abscess to suggest necrotizing fasciitis. 2. Bilateral groin lymphadenopathy is probably reactive. 3. Iliac vascular calcifications consistent with atherosclerosis. Electronically Signed   By: Burman Nieves M.D.   On: 10/26/2022 01:26    Pending Labs Unresulted Labs (From admission, onward)     Start     Ordered   10/27/22 2039  Magnesium  Add-on,   AD        10/27/22 2038   10/27/22 1515  Blood culture (routine x 2)  BLOOD CULTURE X 2,   R (with STAT occurrences)      10/27/22 1514            Vitals/Pain Today's Vitals   10/27/22 1700 10/27/22 1800 10/27/22 1820 10/27/22 1900  BP: 139/70 (!) 141/89  (!) 149/87  Pulse: 91 91  84  Resp: 20 16  13   Temp:   98.2 F (36.8 C)   TempSrc:   Oral   SpO2: 100% 99%  100%  Weight:      Height:      PainSc:        Isolation Precautions No active  isolations  Medications Medications  ceFEPIme (MAXIPIME) 2 g in sodium chloride 0.9 % 100 mL IVPB (0 g Intravenous Stopped 10/27/22 1600)  lactated ringers bolus 1,000 mL (0  mLs Intravenous Stopped 10/27/22 1635)  vancomycin (VANCOREADY) IVPB 1500 mg/300 mL (0 mg Intravenous Stopped 10/27/22 1812)  lactated ringers bolus 1,500 mL (0 mLs Intravenous Stopped 10/27/22 1714)  clindamycin (CLEOCIN) IVPB 600 mg (0 mg Intravenous Stopped 10/27/22 1650)  iohexol (OMNIPAQUE) 350 MG/ML injection 75 mL (75 mLs Intravenous Contrast Given 10/27/22 1746)    Mobility walks     Focused Assessments Cardiac Assessment Handoff:  Cardiac Rhythm: Sinus tachycardia No results found for: "CKTOTAL", "CKMB", "CKMBINDEX", "TROPONINI" Lab Results  Component Value Date   DDIMER 1.25 (H) 07/16/2020   Does the Patient currently have chest pain? No  , Neuro Assessment Handoff:  Swallow screen pass? Yes  Cardiac Rhythm: Sinus tachycardia       Neuro Assessment:   Neuro Checks:      Has TPA been given? No If patient is a Neuro Trauma and patient is going to OR before floor call report to 4N Charge nurse: [phone] or [phone]  , Pulmonary Assessment Handoff:  Lung sounds:   O2 Device: Room Air      R Recommendations: See Admitting Provider Note  Report given to:   Additional Notes:

## 2022-10-27 NOTE — ED Provider Triage Note (Signed)
Emergency Medicine Provider Triage Evaluation Note  Tyreke Bassinger , a 52 y.o. male  was evaluated in triage.  Pt complains of skin infections including scrotum.  Review of Systems  Positive: Draining abscess scrotum, blister on finger Negative: Fever, vomiting  Physical Exam  BP (!) 156/116 (BP Location: Right Arm)   Pulse (!) 131   Temp 98 F (36.7 C)   Resp 20   Ht 5\' 10"  (1.778 m)   Wt 81.6 kg   SpO2 98%   BMI 25.81 kg/m  Gen:   Awake, no distress   Resp:  Normal effort  MSK:   Moves extremities without difficulty  Other:  Draining abscess to scrotum with diffuse erythema. Mildly tender.   Medical Decision Making  Medically screening exam initiated at 2:47 PM.  Appropriate orders placed.  Sincer Collie was informed that the remainder of the evaluation will be completed by another provider, this initial triage assessment does not replace that evaluation, and the importance of remaining in the ED until their evaluation is complete.  Seen 10/23 and admission was recommended for scrotal abscess vs fournier's gangrene. Patient declined admission, discharged on doxycycline which he started yesterday. He is returning now because has reconsidered admission.    Elpidio Anis, PA-C 10/27/22 1449

## 2022-10-27 NOTE — ED Notes (Signed)
Patient transported to CT 

## 2022-10-27 NOTE — Progress Notes (Signed)
Pharmacy Antibiotic Note  Caleb Landry is a 52 y.o. male admitted on 10/27/2022 with R. Middle finger abscess (I&D) and scrotal cellulitis. Pharmacy has been consulted for vancomycin dosing.  WBC 14.5 >> 8.4, lactate 3.8, sCr 1.13, afebrile Has received: 2000mg  IV vancomycin on 10/24 and 1500mg  IV x1 on 10/25 ~1600 Blood cultures collected  Plan: -Ceftriaxone 2g IV every 24 hours per MD -Vancomycin 1000mg  IV every 12 hours (AUC 481, Vd 0.72, IBW) -Monitor renal function -Follow up signs of clinical improvement, de-escalation, LOT  Height: 5\' 10"  (177.8 cm) Weight: 81.6 kg (179 lb 14.3 oz) IBW/kg (Calculated) : 73  Temp (24hrs), Avg:98.1 F (36.7 C), Min:98 F (36.7 C), Max:98.2 F (36.8 C)  Recent Labs  Lab 10/25/22 2051 10/26/22 0021 10/27/22 1451 10/27/22 1457 10/27/22 1701  WBC 14.5*  --  8.4  --   --   CREATININE 0.78  --  1.13  --   --   LATICACIDVEN  --  1.2  --  4.9* 3.8*    Estimated Creatinine Clearance: 79.9 mL/min (by C-G formula based on SCr of 1.13 mg/dL).    Allergies  Allergen Reactions   Penicillins Hives    Antimicrobials this admission: Vancomycin 10/24 >>  Ceftriaxone 10/24 >>    Microbiology results: 10/25 BCx:   Thank you for allowing pharmacy to be a part of this patient's care.  Arabella Merles, PharmD. Clinical Pharmacist 10/27/2022 10:27 PM

## 2022-10-27 NOTE — Hospital Course (Signed)
Caleb Landry is a 52 y.o. male with medical history significant for chronic systolic CHF (EF improved to 40-45% from 20-25%), asthma, tobacco use who is admitted for scrotal cellulitis failing outpatient antibiotics.

## 2022-10-27 NOTE — H&P (Signed)
History and Physical    Caleb Landry WUJ:[id number] DOB: 10/04/1970 DOA: 10/27/2022  PCP: Ivonne Andrew, NP  Patient coming from: Home  I have personally briefly reviewed patient's old medical records in [facility]  Chief Complaint: Scrotal infection  HPI: Caleb Landry is a 52 y.o. male with medical history significant for chronic systolic CHF (EF improved to 40-45% from 20-25%), asthma, tobacco use who presented to the ED for evaluation of worsening scrotal infection.  Patient states about 5 days ago he noticed swelling and pain involving the underside of his scrotum.  Around this time he also noted swelling and pain near the PIP of the right middle finger.  Patient was initially seen in the ED on 10/23 for abscess to his right middle finger into the scrotum.  I&D of the right middle finger abscess was performed by the ED provider.  CT pelvis did not show soft tissue gas or abscess to suggest necrotizing fasciitis.  Patient was given IV vancomycin and ceftriaxone in the ED and he was discharged to home on doxycycline and Bactrim.  Admission was offered but patient declined.  Patient states he has been taking antibiotics as prescribed however he has had increased pain and purulent discharge from the scrotal wound site.  He says right middle finger site has been healing but still seeing some purulent discharge.  ROM is completely intact.  He denies fevers, chills, diaphoresis, chest pain, dyspnea.  He reports good urinary output without penile discharge.  He reports smoking about 1/3 pack of cigarettes daily.  ED Course  Labs/Imaging on admission: I have personally reviewed following labs and imaging studies.  Initial vitals showed BP 156/116, pulse 131, RR 20, temp 98.0 F, SpO2 98% on room air.  Labs show WBC 8.4, hemoglobin 13.0, platelets 332,000, sodium 139, potassium 2.9, bicarb 24, BUN 8, creatinine 1.13, serum glucose 146, LFTs within normal limits, lactic acid 4.9 >  3.8.  Blood cultures in process.  CT pelvis with contrast showed scrotal wall edema and inflammatory stranding in the region of the distal inguinal canals, similar to prior.  Findings may be related to cellulitis.  No soft tissue gas identified.  Stable enlarged left external iliac chain and bilateral inguinal lymph nodes seen.  Diffuse bladder wall thickening and colonic diverticulosis noted.  Patient was given 2.5 L LR, IV vancomycin, cefepime, clindamycin.  EDP spoke with urology who recommended medical admission and they will see in consultation tomorrow.  The hospitalist service was consulted to admit.  Review of Systems: All systems reviewed and are negative except as documented in history of present illness above.   Past Medical History:  Diagnosis Date   Asthma    Neuropathy     Past Surgical History:  Procedure Laterality Date   APPENDECTOMY     HERNIA REPAIR     RIGHT/LEFT HEART CATH AND CORONARY ANGIOGRAPHY N/A 07/19/2020   Procedure: RIGHT/LEFT HEART CATH AND CORONARY ANGIOGRAPHY;  Surgeon: Dolores Patty, MD;  Location: [facility];  Service: Cardiovascular;  Laterality: N/A;    Social History:  reports that he has been smoking cigarettes. He started smoking about 32 years ago. He has a 15 pack-year smoking history. He has been exposed to tobacco smoke. He has never used smokeless tobacco. He reports current alcohol use. He reports that he does not use drugs.  Allergies  Allergen Reactions   Penicillins Hives    Family History  Problem Relation Age of Onset   Crohn's disease  Mother      Prior to Admission medications   Medication Sig Start Date End Date Taking? Authorizing Provider  albuterol (PROVENTIL) (2.5 MG/3ML) 0.083% nebulizer solution Take 3 mLs (2.5 mg total) by nebulization every 6 (six) hours as needed for wheezing or shortness of breath. 03/20/22  Yes Ivonne Andrew, NP  albuterol (VENTOLIN HFA) 108 (90 Base) MCG/ACT inhaler INHALE 2  PUFFS INTO THE LUNGS EVERY 6 HOURS AS NEEDED FOR WHEEZING OR SHORTNESS OF BREATH 09/21/22  Yes Ivonne Andrew, NP  dapagliflozin propanediol (FARXIGA) 10 MG TABS tablet Take 1 tablet (10 mg total) by mouth daily before breakfast. 01/18/22  Yes Ivonne Andrew, NP  doxycycline (VIBRAMYCIN) 100 MG capsule Take 1 capsule (100 mg total) by mouth 2 (two) times daily. 10/26/22  Yes Pollina, Canary Brim, MD  furosemide (LASIX) 40 MG tablet Take 1 tablet (40 mg total) by mouth daily as needed. 01/18/22  Yes Ivonne Andrew, NP  sacubitril-valsartan (ENTRESTO) 97-103 MG Take 1 tablet by mouth 2 (two) times daily. 03/20/22  Yes Ivonne Andrew, NP  sulfamethoxazole-trimethoprim (BACTRIM DS) 800-160 MG tablet Take 1 tablet by mouth 2 (two) times daily. 10/26/22  Yes Pollina, Canary Brim, MD  emtricitabine-tenofovir AF (DESCOVY) 200-25 MG tablet Take 1 tablet by mouth daily. Patient not taking: Reported on 10/27/2022 04/19/22   Kuppelweiser, Cassie L, RPH-CPP  gabapentin (NEURONTIN) 300 MG capsule Take 2 capsules (600 mg total) by mouth 2 (two) times daily. Patient not taking: Reported on 10/27/2022 01/18/22 10/27/22  Ivonne Andrew, NP    Physical Exam: Vitals:   10/27/22 1900 10/27/22 2000 10/27/22 2100 10/27/22 2129  BP: (!) 149/87 (!) 148/90 (!) 152/91 (!) 156/87  Pulse: 84 83 80 88  Resp: 13 18 19 18   Temp:    98 F (36.7 C)  TempSrc:    Oral  SpO2: 100% 99% 100% 99%  Weight:      Height:       Constitutional: Resting in bed, NAD, calm, comfortable Eyes: PERRL, lids and conjunctivae normal ENMT: Mucous membranes are moist. Posterior pharynx clear of any exudate or lesions.Normal dentition.  Neck: normal, supple, no masses. Respiratory: Expiratory wheezing bilaterally. Normal respiratory effort. No accessory muscle use.  Cardiovascular: Regular rate and rhythm, no murmurs / rubs / gallops. No extremity edema. 2+ pedal pulses. Abdomen: no tenderness, no masses palpated.  Musculoskeletal:  no clubbing / cyanosis. No joint deformity upper and lower extremities. Good ROM, no contractures. Normal muscle tone.  Skin: Erythema, swelling, induration lower aspect of scrotum near the perineum.  No active discharge at time of exam.  PIP of right middle finger also with swelling and punctate open wound without active discharge Neurologic: Sensation intact. Strength 5/5 in all 4.  Psychiatric: Normal judgment and insight. Alert and oriented x 3. Normal mood.   EKG: Personally reviewed. Sinus tachycardia, rate 110, no acute ischemic changes.  Rate is faster when compared to previous.  Assessment/Plan Principal Problem:   Cellulitis of scrotum Active Problems:   Chronic systolic heart failure (HCC)   Hypokalemia   Asthma   Caleb Landry is a 52 y.o. male with medical history significant for chronic systolic CHF (EF improved to 40-45% from 20-25%), asthma, tobacco use who is admitted for scrotal cellulitis failing outpatient antibiotics.  Assessment and Plan: Scrotal cellulitis: Persistent purulent discharge, erythema despite outpatient antibiotics.  CT pelvis without evidence of soft tissue gas.  WBC improved, lactic acid elevated but improving on repeat. -Continue IV vancomycin  and ceftriaxone  Chronic systolic CHF: Appears euvolemic.  Latest EF improved to 40-45% from previous 20-25%. -Continue Entresto  -[employer] given scrotal infection  Hypokalemia: Supplementing.  Recheck in AM.  Asthma: Continue albuterol as needed.  Right middle finger abscess: S/p I&D in the ED on 10/23 prior to this admission.  Still some swelling in the area but appears to be healing well.   DVT prophylaxis: enoxaparin (LOVENOX) injection 40 mg Start: 10/28/22 2200 Code Status: Full code, confirmed with patient on admission Family Communication: Discussed with patient, he has discussed with family Disposition Plan: From home and likely discharge to home pending clinical progress Consults called:  EDP spoke with urology Severity of Illness: The appropriate patient status for this patient is INPATIENT. Inpatient status is judged to be reasonable and necessary in order to provide the required intensity of service to ensure the patient's safety. The patient's presenting symptoms, physical exam findings, and initial radiographic and laboratory data in the context of their chronic comorbidities is felt to place them at high risk for further clinical deterioration. Furthermore, it is not anticipated that the patient will be medically stable for discharge from the hospital within 2 midnights of admission.   * I certify that at the point of admission it is my clinical judgment that the patient will require inpatient hospital care spanning beyond 2 midnights from the point of admission due to high intensity of service, high risk for further deterioration and high frequency of surveillance required.Darreld Mclean MD [facility]  If 7PM-7AM, please contact night-coverage [email or web address]  10/27/2022, 10:55 PM

## 2022-10-28 DIAGNOSIS — N492 Inflammatory disorders of scrotum: Secondary | ICD-10-CM | POA: Diagnosis not present

## 2022-10-28 LAB — CBC
HCT: 34 % — ABNORMAL LOW (ref 39.0–52.0)
Hemoglobin: 11.4 g/dL — ABNORMAL LOW (ref 13.0–17.0)
MCH: 31.6 pg (ref 26.0–34.0)
MCHC: 33.5 g/dL (ref 30.0–36.0)
MCV: 94.2 fL (ref 80.0–100.0)
Platelets: 276 10*3/uL (ref 150–400)
RBC: 3.61 MIL/uL — ABNORMAL LOW (ref 4.22–5.81)
RDW: 12.3 % (ref 11.5–15.5)
WBC: 7 10*3/uL (ref 4.0–10.5)
nRBC: 0 % (ref 0.0–0.2)

## 2022-10-28 LAB — BASIC METABOLIC PANEL
Anion gap: 10 (ref 5–15)
BUN: 9 mg/dL (ref 6–20)
CO2: 26 mmol/L (ref 22–32)
Calcium: 9 mg/dL (ref 8.9–10.3)
Chloride: 103 mmol/L (ref 98–111)
Creatinine, Ser: 0.84 mg/dL (ref 0.61–1.24)
GFR, Estimated: >60 mL/min (ref >60)
Glucose, Bld: 106 mg/dL — ABNORMAL HIGH (ref 70–99)
Potassium: 4 mmol/L (ref 3.5–5.1)
Sodium: 139 mmol/L (ref 135–145)

## 2022-10-28 MED ORDER — VANCOMYCIN HCL 1250 MG/250ML IV SOLN
1250.0000 mg | Freq: Two times a day (BID) | INTRAVENOUS | Status: DC
  Administered 2022-10-28 – 2022-10-29 (×2): 1250 mg via INTRAVENOUS
  Filled 2022-10-28 (×2): qty 250

## 2022-10-28 NOTE — Plan of Care (Signed)

## 2022-10-28 NOTE — Consult Note (Signed)
Urology Consult   Physician requesting consult: Pamella Pert, MD  Reason for consult: Scrotal cellulitis  History of Present Illness: Caleb Landry is a 52 y.o. with a history of chronic systolic CHF, asthma, tobacco use who presented to the ER with worsening scrotal cellulitis.  Initially presented the emergency department on 10/25/2022 with complaint of right middle finger cellulitis as well as scrotal cellulitis with purulent drainage.  At that time, CT pelvis revealed scrotal thickening consistent with cellulitis however no soft tissue gas or abscess.  He was offered admission with IV antibiotics however declined and was started on a course of Bactrim.  He represented on 10/25/124 with continued pain and purulent drainage from his scrotum.  Repeat CT pelvis was obtained that revealed scrotal wall edema and inflammatory stranding similar to prior consistent with cellulitis.  No soft tissue gas was identified.  He has stable enlarged left external iliac chain and bilateral inguinal lymph nodes.  He also had diffuse bladder wall thickening.  Urine culture 10/25/2022 with less than 10K insignificant growth.  On examination, he was found to have erythema and induration over his perineum however no significant purulent drainage and no fluctuant areas.  Thus, he was admitted to hospice for IV antibiotics.  Patient states that today, he has continued pain however no further purulent drainage from his scrotum.  He feels much better with IV antibiotics. -He does report prior history of induration and cellulitis was spontaneously drained of purulence from his right lateral shaft of his penis several years ago that resolved without surgical intervention.  He states that when he grows his pubic area, he will commonly develop folliculitis.  Past Medical History:  Diagnosis Date   Asthma    Neuropathy     Past Surgical History:  Procedure Laterality Date   APPENDECTOMY     HERNIA REPAIR     RIGHT/LEFT  HEART CATH AND CORONARY ANGIOGRAPHY N/A 07/19/2020   Procedure: RIGHT/LEFT HEART CATH AND CORONARY ANGIOGRAPHY;  Surgeon: Dolores Patty, MD;  Location: [facility];  Service: Cardiovascular;  Laterality: N/A;    Medications:  Home meds:  No current facility-administered medications on file prior to encounter.   Current Outpatient Medications on File Prior to Encounter  Medication Sig Dispense Refill   albuterol (PROVENTIL) (2.5 MG/3ML) 0.083% nebulizer solution Take 3 mLs (2.5 mg total) by nebulization every 6 (six) hours as needed for wheezing or shortness of breath. 150 mL 1   albuterol (VENTOLIN HFA) 108 (90 Base) MCG/ACT inhaler INHALE 2 PUFFS INTO THE LUNGS EVERY 6 HOURS AS NEEDED FOR WHEEZING OR SHORTNESS OF BREATH 8.5 g 2   dapagliflozin propanediol (FARXIGA) 10 MG TABS tablet Take 1 tablet (10 mg total) by mouth daily before breakfast. 90 tablet 3   doxycycline (VIBRAMYCIN) 100 MG capsule Take 1 capsule (100 mg total) by mouth 2 (two) times daily. 20 capsule 0   furosemide (LASIX) 40 MG tablet Take 1 tablet (40 mg total) by mouth daily as needed. 30 tablet 6   sacubitril-valsartan (ENTRESTO) 97-103 MG Take 1 tablet by mouth 2 (two) times daily. 180 tablet 3   sulfamethoxazole-trimethoprim (BACTRIM DS) 800-160 MG tablet Take 1 tablet by mouth 2 (two) times daily. 20 tablet 0   emtricitabine-tenofovir AF (DESCOVY) 200-25 MG tablet Take 1 tablet by mouth daily. (Patient not taking: Reported on 10/27/2022) 30 tablet 2   gabapentin (NEURONTIN) 300 MG capsule Take 2 capsules (600 mg total) by mouth 2 (two) times daily. (Patient not taking: Reported  on 10/27/2022) 120 capsule 5     Scheduled Meds:  enoxaparin (LOVENOX) injection  40 mg Subcutaneous Q24H   sacubitril-valsartan  1 tablet Oral BID   Continuous Infusions:  cefTRIAXone (ROCEPHIN)  IV Stopped (10/27/22 2315)   vancomycin     PRN Meds:.acetaminophen **OR** acetaminophen, albuterol, ondansetron **OR** ondansetron  (ZOFRAN) IV, senna-docusate  Allergies:  Allergies  Allergen Reactions   Penicillins Hives    Family History  Problem Relation Age of Onset   Crohn's disease Mother     Social History:  reports that he has been smoking cigarettes. He started smoking about 32 years ago. He has a 15 pack-year smoking history. He has been exposed to tobacco smoke. He has never used smokeless tobacco. He reports current alcohol use. He reports that he does not use drugs.  ROS: A complete review of systems was performed.  All systems are negative except for pertinent findings as noted.  Physical Exam:  Vital signs in last 24 hours: Temp:  [97.7 F (36.5 C)-98.2 F (36.8 C)] 98 F (36.7 C) (10/26 0700) Pulse Rate:  [78-131] 78 (10/26 0700) Resp:  [13-20] 17 (10/26 0700) BP: (101-156)/(54-116) 138/93 (10/26 0700) SpO2:  [94 %-100 %] 98 % (10/26 0700) Weight:  [81.6 kg] 81.6 kg (10/25 1437) Constitutional:  Alert and oriented, No acute distress Cardiovascular: Regular rate and rhythm Respiratory: Normal respiratory effort, Lungs clear bilaterally GI: Abdomen is soft, nontender, nondistended, no abdominal masses Genitourinary: No CVAT. Normal male phallus, testes are descended bilaterally and non-tender and without masses, anterior portion of the scrotum at the midline as well as perineum with an area of erythema and edema with induration.  No fluctuance appreciated.  No necrosis.  No crepitance.  No drainage.  And I am unable to express any drainage. Neurologic: Grossly intact, no focal deficits Psychiatric: Normal mood and affect  Laboratory Data:  Recent Labs    10/25/22 2051 10/27/22 1451 10/28/22 0423  WBC 14.5* 8.4 7.0  HGB 13.4 13.0 11.4*  HCT 39.7 39.9 34.0*  PLT 357 332 276    Recent Labs    10/25/22 2051 10/27/22 1451 10/28/22 0423  NA 140 139 139  K 3.7 2.9* 4.0  CL 101 99 103  GLUCOSE 112* 146* 106*  BUN 6 8 9   CALCIUM 9.2 9.0 9.0  CREATININE 0.78 1.13 0.84      Results for orders placed or performed during the hospital encounter of 10/27/22 (from the past 24 hour(s))  Comprehensive metabolic panel     Status: Abnormal   Collection Time: 10/27/22  2:51 PM  Result Value Ref Range   Sodium 139 135 - 145 mmol/L   Potassium 2.9 (L) 3.5 - 5.1 mmol/L   Chloride 99 98 - 111 mmol/L   CO2 24 22 - 32 mmol/L   Glucose, Bld 146 (H) 70 - 99 mg/dL   BUN 8 6 - 20 mg/dL   Creatinine, Ser 8.41 0.61 - 1.24 mg/dL   Calcium 9.0 8.9 - 32.4 mg/dL   Total Protein 7.0 6.5 - 8.1 g/dL   Albumin 3.5 3.5 - 5.0 g/dL   AST 20 15 - 41 U/L   ALT 15 0 - 44 U/L   Alkaline Phosphatase 73 38 - 126 U/L   Total Bilirubin 0.4 0.3 - 1.2 mg/dL   GFR, Estimated >40 >10 mL/min   Anion gap 16 (H) 5 - 15  CBC with Differential     Status: Abnormal   Collection Time: 10/27/22  2:51 PM  Result Value Ref Range   WBC 8.4 4.0 - 10.5 K/uL   RBC 4.15 (L) 4.22 - 5.81 MIL/uL   Hemoglobin 13.0 13.0 - 17.0 g/dL   HCT 60.4 54.0 - 98.1 %   MCV 96.1 80.0 - 100.0 fL   MCH 31.3 26.0 - 34.0 pg   MCHC 32.6 30.0 - 36.0 g/dL   RDW 19.1 47.8 - 29.5 %   Platelets 332 150 - 400 K/uL   nRBC 0.0 0.0 - 0.2 %   Neutrophils Relative % 72 %   Neutro Abs 6.0 1.7 - 7.7 K/uL   Lymphocytes Relative 19 %   Lymphs Abs 1.6 0.7 - 4.0 K/uL   Monocytes Relative 7 %   Monocytes Absolute 0.6 0.1 - 1.0 K/uL   Eosinophils Relative 0 %   Eosinophils Absolute 0.0 0.0 - 0.5 K/uL   Basophils Relative 1 %   Basophils Absolute 0.1 0.0 - 0.1 K/uL   Immature Granulocytes 1 %   Abs Immature Granulocytes 0.06 0.00 - 0.07 K/uL  Magnesium     Status: None   Collection Time: 10/27/22  2:51 PM  Result Value Ref Range   Magnesium 1.9 1.7 - 2.4 mg/dL  I-Stat Lactic Acid, ED     Status: Abnormal   Collection Time: 10/27/22  2:57 PM  Result Value Ref Range   Lactic Acid, Venous 4.9 (HH) 0.5 - 1.9 mmol/L   Comment NOTIFIED PHYSICIAN   Blood culture (routine x 2)     Status: None (Preliminary result)   Collection  Time: 10/27/22  3:25 PM   Specimen: BLOOD  Result Value Ref Range   Specimen Description BLOOD LEFT ANTECUBITAL    Special Requests      BOTTLES DRAWN AEROBIC AND ANAEROBIC Blood Culture adequate volume   Culture      NO GROWTH < 24 HOURS Performed at [facility], [address] [place], [place] [address]    Report Status PENDING   Blood culture (routine x 2)     Status: None (Preliminary result)   Collection Time: 10/27/22  3:35 PM   Specimen: BLOOD RIGHT HAND  Result Value Ref Range   Specimen Description BLOOD RIGHT HAND    Special Requests      BOTTLES DRAWN AEROBIC AND ANAEROBIC Blood Culture adequate volume   Culture      NO GROWTH < 24 HOURS Performed at [facility], [address], [place], [place] [address]    Report Status PENDING   I-Stat Lactic Acid, ED     Status: Abnormal   Collection Time: 10/27/22  5:01 PM  Result Value Ref Range   Lactic Acid, Venous 3.8 (HH) 0.5 - 1.9 mmol/L   Comment NOTIFIED PHYSICIAN   CBC     Status: Abnormal   Collection Time: 10/28/22  4:23 AM  Result Value Ref Range   WBC 7.0 4.0 - 10.5 K/uL   RBC 3.61 (L) 4.22 - 5.81 MIL/uL   Hemoglobin 11.4 (L) 13.0 - 17.0 g/dL   HCT 46.9 (L) 62.9 - 52.8 %   MCV 94.2 80.0 - 100.0 fL   MCH 31.6 26.0 - 34.0 pg   MCHC 33.5 30.0 - 36.0 g/dL   RDW 41.3 24.4 - 01.0 %   Platelets 276 150 - 400 K/uL   nRBC 0.0 0.0 - 0.2 %  Basic metabolic panel     Status: Abnormal   Collection Time: 10/28/22  4:23 AM  Result Value Ref Range  Sodium 139 135 - 145 mmol/L   Potassium 4.0 3.5 - 5.1 mmol/L   Chloride 103 98 - 111 mmol/L   CO2 26 22 - 32 mmol/L   Glucose, Bld 106 (H) 70 - 99 mg/dL   BUN 9 6 - 20 mg/dL   Creatinine, Ser 1.61 0.61 - 1.24 mg/dL   Calcium 9.0 8.9 - 09.6 mg/dL   GFR, Estimated >04 >54 mL/min   Anion gap 10 5 - 15   Recent Results (from the past 240 hour(s))  Urine Culture     Status: Abnormal   Collection Time: 10/25/22  3:11 AM   Specimen: Urine, Random  Result  Value Ref Range Status   Specimen Description URINE, RANDOM  Final   Special Requests NONE  Final   Culture (A)  Final    <10,000 COLONIES/mL INSIGNIFICANT GROWTH Performed at [facility], [address], [place], [place] [address]    Report Status 10/27/2022 FINAL  Final  Blood Culture (routine x 2)     Status: None (Preliminary result)   Collection Time: 10/26/22 12:05 AM   Specimen: BLOOD RIGHT ARM  Result Value Ref Range Status   Specimen Description BLOOD RIGHT ARM  Final   Special Requests   Final    BOTTLES DRAWN AEROBIC AND ANAEROBIC Blood Culture results may not be optimal due to an excessive volume of blood received in culture bottles   Culture   Final    NO GROWTH 2 DAYS Performed at [facility], [address], [place], [place] [address]    Report Status PENDING  Incomplete  Blood Culture (routine x 2)     Status: None (Preliminary result)   Collection Time: 10/26/22 12:13 AM   Specimen: BLOOD LEFT ARM  Result Value Ref Range Status   Specimen Description BLOOD LEFT ARM  Final   Special Requests   Final    BOTTLES DRAWN AEROBIC AND ANAEROBIC Blood Culture adequate volume   Culture   Final    NO GROWTH 2 DAYS Performed at [facility], [address] [place], [place] [address]    Report Status PENDING  Incomplete  Blood culture (routine x 2)     Status: None (Preliminary result)   Collection Time: 10/27/22  3:25 PM   Specimen: BLOOD  Result Value Ref Range Status   Specimen Description BLOOD LEFT ANTECUBITAL  Final   Special Requests   Final    BOTTLES DRAWN AEROBIC AND ANAEROBIC Blood Culture adequate volume   Culture   Final    NO GROWTH < 24 HOURS Performed at [facility], [address], [place], [place] [address]    Report Status PENDING  Incomplete  Blood culture (routine x 2)     Status: None (Preliminary result)   Collection Time: 10/27/22  3:35 PM   Specimen: BLOOD RIGHT HAND  Result Value Ref Range Status   Specimen  Description BLOOD RIGHT HAND  Final   Special Requests   Final    BOTTLES DRAWN AEROBIC AND ANAEROBIC Blood Culture adequate volume   Culture   Final    NO GROWTH < 24 HOURS Performed at [facility], [address], [place], [place] [address]    Report Status PENDING  Incomplete    Renal Function: Recent Labs    10/25/22 2051 10/27/22 1451 10/28/22 0423  CREATININE 0.78 1.13 0.84   Estimated Creatinine Clearance: 107.4 mL/min (by C-G formula based on SCr of  0.84 mg/dL).  Radiologic Imaging: CT PELVIS W CONTRAST  Result Date: 10/27/2022 CLINICAL DATA:  Scrotal mass. EXAM: CT PELVIS WITH CONTRAST TECHNIQUE: Multidetector CT imaging of the pelvis was performed using the standard protocol following the bolus administration of intravenous contrast. RADIATION DOSE REDUCTION: This exam was performed according to the departmental dose-optimization program which includes automated exposure control, adjustment of the mA and/or kV according to patient size and/or use of iterative reconstruction technique. CONTRAST:  75mL OMNIPAQUE IOHEXOL 350 MG/ML SOLN COMPARISON:  Pelvis CT 10/26/2022 FINDINGS: Urinary Tract: There is diffuse bladder wall thickening. There is no surrounding inflammation. Bowel: No dilated bowel loops are seen. Colonic diverticula are noted. No focal bowel inflammation. Vascular/Lymphatic: No acute vascular abnormality. There is an enlarged left external iliac chain lymph node measuring 11 mm short axis. There are enlarged bilateral inguinal lymph nodes measuring up to 13 mm short axis. These are similar to prior. Reproductive: Prostate gland is within normal limits. There is scrotal wall edema and inflammatory stranding in the region of the distal inguinal canals, similar to prior. No soft tissue gas identified. Other:  There is no ascites or focal abdominal wall hernia. Musculoskeletal: No acute osseous abnormality. Stable chronic appearing compression deformity of L4.  IMPRESSION: 1. Scrotal wall edema and inflammatory stranding in the region of the distal inguinal canals, similar to prior. Findings may be related to cellulitis. No soft tissue gas identified. 2. Stable enlarged left external iliac chain and bilateral inguinal lymph nodes. 3. Diffuse bladder wall thickening, possibly cystitis. Correlate with urinalysis. 4. Colonic diverticulosis. Electronically Signed   By: Darliss Cheney M.D.   On: 10/27/2022 19:24   DG Chest Portable 1 View  Result Date: 10/27/2022 CLINICAL DATA:  Sepsis. EXAM: PORTABLE CHEST 1 VIEW COMPARISON:  Chest radiograph dated January 20, 2022. FINDINGS: The heart size and mediastinal contours are within normal limits. Both lungs are clear. No pneumothorax or pleural effusion. No acute osseous abnormality. IMPRESSION: No acute cardiopulmonary findings. Electronically Signed   By: Hart Robinsons M.D.   On: 10/27/2022 16:56    I independently reviewed the above imaging studies.  Impression/Recommendation Scrotal cellulitis:  Exam and imaging consistent with scrotal cellulitis.  No underlying abscess to drain.  Continue IV antibiotics and recommend outpatient course of p.o. antibiotics for least 2 weeks.  Discussed with patient that if he develops an area of fluctuance, we can easily perform bedside incision and drainage however no utility in this without evidence of underlying abscess. Following  Matt R. Zaylah Blecha MD 10/28/2022, 1:08 PM  [facility]  Pager: [phone]   CC: Pamella Pert, MD

## 2022-10-28 NOTE — Progress Notes (Signed)
Pharmacy Antibiotic Note  Caleb Landry is a 52 y.o. male admitted on 10/27/2022 with R. Middle finger abscess (I&D) and scrotal cellulitis. Previously discharge on doxycycline and bactrim. Pharmacy has been consulted for vancomycin dosing.  Scr improved overnight, will empirically increase dose of vancomycin.   Plan: -Ceftriaxone 2g IV every 24 hours per MD -Increase Vancomycin 1250mg  IV every 12 hours (AUC ~456, Vd 0.72, IBW) -Monitor renal function -Follow up signs of clinical improvement, de-escalation, LOT F/U urology recs   Height: 5\' 10"  (177.8 cm) Weight: 81.6 kg (179 lb 14.3 oz) IBW/kg (Calculated) : 73  Temp (24hrs), Avg:98 F (36.7 C), Min:97.7 F (36.5 C), Max:98.2 F (36.8 C)  Recent Labs  Lab 10/25/22 2051 10/26/22 0021 10/27/22 1451 10/27/22 1457 10/27/22 1701 10/28/22 0423  WBC 14.5*  --  8.4  --   --  7.0  CREATININE 0.78  --  1.13  --   --  0.84  LATICACIDVEN  --  1.2  --  4.9* 3.8*  --     Estimated Creatinine Clearance: 107.4 mL/min (by C-G formula based on SCr of 0.84 mg/dL).    Allergies  Allergen Reactions   Penicillins Hives    Antimicrobials this admission: Vancomycin 10/24 >>  Ceftriaxone 10/24 >>    Microbiology results: 10/25 BCx:   Thank you for allowing pharmacy to be a part of this patient's care.  Jani Gravel, PharmD Clinical Pharmacist  10/28/2022 9:16 AM

## 2022-10-28 NOTE — Progress Notes (Signed)
PROGRESS NOTE  Caleb Landry YNW:[id number] DOB: [birth year] DOA: 10/27/2022 PCP: Ivonne Andrew, NP   LOS: 1 day   Brief Narrative / Interim history: 52 year old male with history of chronic systolic CHF, EF improved 40 to 45% from 20 to 25%, asthma, tobacco use who comes into the hospital with worsening scrotal infection.  He presented to the ED on 10/23, received antibiotics, was offered admission but he wanted to go home.  He went home on doxycycline and Bactrim, took them for a day but felt like the area was not improving and decided to come back to the ER.  CT scan on admission did not show any soft tissue gas or abscess to suggest necrotizing fasciitis or fluid collection.  He was placed on antibiotics and admitted to the hospital  Subjective / 24h Interval events: He tells me that he is feeling better this morning.  He has less pain in that area  Assesement and Plan: Principal Problem:   Cellulitis of scrotum Active Problems:   Chronic systolic heart failure (HCC)   Hypokalemia   Asthma  Principal problem Scrotal cellulitis -with superficial purulent discharge, erythema despite outpatient antibiotics.  CT of the pelvis without evidence of soft tissue gas.  White count improving, continue intravenous antibiotics of vancomycin and ceftriaxone  Active problems Chronic systolic CHF-currently appears euvolemic, latest EF showed improvement from 20-25% to 40-45%.  Continue Entresto, hold fark [employer] given infection  Hypokalemia -supplement and continue to monitor  History of asthma-no wheezing, continue albuterol  Right middle finger abscess-status post I&D in the emergency room 10/23, appears to be healing  Scheduled Meds:  enoxaparin (LOVENOX) injection  40 mg Subcutaneous Q24H   sacubitril-valsartan  1 tablet Oral BID   Continuous Infusions:  cefTRIAXone (ROCEPHIN)  IV Stopped (10/27/22 2315)   vancomycin     PRN Meds:.acetaminophen **OR** acetaminophen, albuterol,  ondansetron **OR** ondansetron (ZOFRAN) IV, senna-docusate  Current Outpatient Medications  Medication Instructions   albuterol (PROVENTIL) 2.5 mg, Nebulization, Every 6 hours PRN   albuterol (VENTOLIN HFA) 108 (90 Base) MCG/ACT inhaler 2 puffs, Inhalation, Every 6 hours PRN   dapagliflozin propanediol (FARXIGA) 10 mg, Oral, Daily before breakfast   doxycycline (VIBRAMYCIN) 100 mg, Oral, 2 times daily   emtricitabine-tenofovir AF (DESCOVY) 200-25 MG tablet 1 tablet, Oral, Daily   furosemide (LASIX) 40 mg, Oral, Daily PRN   gabapentin (NEURONTIN) 600 mg, Oral, 2 times daily   sacubitril-valsartan (ENTRESTO) 97-103 MG 1 tablet, Oral, 2 times daily   sulfamethoxazole-trimethoprim (BACTRIM DS) 800-160 MG tablet 1 tablet, Oral, 2 times daily    Diet Orders (From admission, onward)     Start     Ordered   10/27/22 2217  Diet Heart Fluid consistency: Thin  Diet effective now       Question:  Fluid consistency:  Answer:  Thin   10/27/22 2217            DVT prophylaxis: enoxaparin (LOVENOX) injection 40 mg Start: 10/28/22 2200   Lab Results  Component Value Date   PLT 276 10/28/2022      Code Status: Full Code  Family Communication: no family at bedside   Status is: Inpatient Remains inpatient appropriate because: severity of illness  Level of care: Med-Surg  Consultants:  none  Objective: Vitals:   10/27/22 2129 10/28/22 0041 10/28/22 0546 10/28/22 0700  BP: (!) 156/87 (!) 101/54 138/77 (!) 138/93  Pulse: 88 81 83 78  Resp: 18 18 18 17   Temp: 98 F (36.7 C)  98.2 F (36.8 C) 97.7 F (36.5 C) 98 F (36.7 C)  TempSrc: Oral Oral Oral Oral  SpO2: 99% 94% 97% 98%  Weight:      Height:        Intake/Output Summary (Last 24 hours) at 10/28/2022 1110 Last data filed at 10/28/2022 0900 Gross per 24 hour  Intake 4010 ml  Output 2050 ml  Net 1960 ml   Wt Readings from Last 3 Encounters:  10/27/22 81.6 kg  10/26/22 81.6 kg  06/08/22 81.1 kg     Examination:  Constitutional: NAD Eyes: no scleral icterus ENMT: Mucous membranes are moist.  Neck: normal, supple Respiratory: clear to auscultation bilaterally, no wheezing, no crackles. Normal respiratory effort. No accessory muscle use.  Cardiovascular: Regular rate and rhythm, no murmurs / rubs / gallops. No LE edema.  Abdomen: non distended, no tenderness. Bowel sounds positive.  Musculoskeletal: no clubbing / cyanosis.  Skin: Cellulitic area in the scrotal region, superficial draining wound, no significant fluctuance felt   Data Reviewed: I have independently reviewed following labs and imaging studies   CBC Recent Labs  Lab 10/25/22 2051 10/27/22 1451 10/28/22 0423  WBC 14.5* 8.4 7.0  HGB 13.4 13.0 11.4*  HCT 39.7 39.9 34.0*  PLT 357 332 276  MCV 96.1 96.1 94.2  MCH 32.4 31.3 31.6  MCHC 33.8 32.6 33.5  RDW 12.4 12.1 12.3  LYMPHSABS  --  1.6  --   MONOABS  --  0.6  --   EOSABS  --  0.0  --   BASOSABS  --  0.1  --     Recent Labs  Lab 10/25/22 2051 10/26/22 0012 10/26/22 0021 10/27/22 1451 10/27/22 1457 10/27/22 1701 10/28/22 0423  NA 140  --   --  139  --   --  139  K 3.7  --   --  2.9*  --   --  4.0  CL 101  --   --  99  --   --  103  CO2 30  --   --  24  --   --  26  GLUCOSE 112*  --   --  146*  --   --  106*  BUN 6  --   --  8  --   --  9  CREATININE 0.78  --   --  1.13  --   --  0.84  CALCIUM 9.2  --   --  9.0  --   --  9.0  AST 22  --   --  20  --   --   --   ALT 17  --   --  15  --   --   --   ALKPHOS 89  --   --  73  --   --   --   BILITOT 0.7  --   --  0.4  --   --   --   ALBUMIN 3.7  --   --  3.5  --   --   --   MG  --   --   --  1.9  --   --   --   LATICACIDVEN  --   --  1.2  --  4.9* 3.8*  --   INR  --  0.9  --   --   --   --   --     ------------------------------------------------------------------------------------------------------------------ No results for input(s): "CHOL", "HDL", "LDLCALC", "TRIG", "CHOLHDL", "LDLDIRECT" in  the last 72 hours.  Lab Results  Component Value Date   HGBA1C 5.9 (H) 01/18/2022   ------------------------------------------------------------------------------------------------------------------ No results for input(s): "TSH", "T4TOTAL", "T3FREE", "THYROIDAB" in the last 72 hours.  Invalid input(s): "FREET3"  Cardiac Enzymes No results for input(s): "CKMB", "TROPONINI", "MYOGLOBIN" in the last 168 hours.  Invalid input(s): "CK" ------------------------------------------------------------------------------------------------------------------    Component Value Date/Time   BNP 6.1 02/21/2022 1041    CBG: No results for input(s): "GLUCAP" in the last 168 hours.  Recent Results (from the past 240 hour(s))  Urine Culture     Status: Abnormal   Collection Time: 10/25/22  3:11 AM   Specimen: Urine, Random  Result Value Ref Range Status   Specimen Description URINE, RANDOM  Final   Special Requests NONE  Final   Culture (A)  Final    <10,000 COLONIES/mL INSIGNIFICANT GROWTH Performed at [facility], [address] [place], [place] [address]    Report Status 10/27/2022 FINAL  Final  Blood Culture (routine x 2)     Status: None (Preliminary result)   Collection Time: 10/26/22 12:05 AM   Specimen: BLOOD RIGHT ARM  Result Value Ref Range Status   Specimen Description BLOOD RIGHT ARM  Final   Special Requests   Final    BOTTLES DRAWN AEROBIC AND ANAEROBIC Blood Culture results may not be optimal due to an excessive volume of blood received in culture bottles   Culture   Final    NO GROWTH 2 DAYS Performed at [facility], [address], [place], [place] [address]    Report Status PENDING  Incomplete  Blood Culture (routine x 2)     Status: None (Preliminary result)   Collection Time: 10/26/22 12:13 AM   Specimen: BLOOD LEFT ARM  Result Value Ref Range Status   Specimen Description BLOOD LEFT ARM  Final   Special Requests   Final    BOTTLES DRAWN AEROBIC AND  ANAEROBIC Blood Culture adequate volume   Culture   Final    NO GROWTH 2 DAYS Performed at [facility], [address] [place], [place] [address]    Report Status PENDING  Incomplete  Blood culture (routine x 2)     Status: None (Preliminary result)   Collection Time: 10/27/22  3:25 PM   Specimen: BLOOD  Result Value Ref Range Status   Specimen Description BLOOD LEFT ANTECUBITAL  Final   Special Requests   Final    BOTTLES DRAWN AEROBIC AND ANAEROBIC Blood Culture adequate volume   Culture   Final    NO GROWTH < 24 HOURS Performed at [facility], [address] [place], [place] [address]    Report Status PENDING  Incomplete  Blood culture (routine x 2)     Status: None (Preliminary result)   Collection Time: 10/27/22  3:35 PM   Specimen: BLOOD RIGHT HAND  Result Value Ref Range Status   Specimen Description BLOOD RIGHT HAND  Final   Special Requests   Final    BOTTLES DRAWN AEROBIC AND ANAEROBIC Blood Culture adequate volume   Culture   Final    NO GROWTH < 24 HOURS Performed at [facility], [address] [place], [place] [address]    Report Status PENDING  Incomplete     Radiology Studies: CT PELVIS W CONTRAST  Result Date: 10/27/2022 CLINICAL DATA:  Scrotal mass. EXAM: CT PELVIS WITH CONTRAST TECHNIQUE: Multidetector CT imaging of the pelvis was performed using  the standard protocol following the bolus administration of intravenous contrast. RADIATION DOSE REDUCTION: This exam was performed according to the departmental dose-optimization program which includes automated exposure control, adjustment of the mA and/or kV according to patient size and/or use of iterative reconstruction technique. CONTRAST:  75mL OMNIPAQUE IOHEXOL 350 MG/ML SOLN COMPARISON:  Pelvis CT 10/26/2022 FINDINGS: Urinary Tract: There is diffuse bladder wall thickening. There is no surrounding inflammation. Bowel: No dilated bowel loops are seen. Colonic diverticula are noted. No  focal bowel inflammation. Vascular/Lymphatic: No acute vascular abnormality. There is an enlarged left external iliac chain lymph node measuring 11 mm short axis. There are enlarged bilateral inguinal lymph nodes measuring up to 13 mm short axis. These are similar to prior. Reproductive: Prostate gland is within normal limits. There is scrotal wall edema and inflammatory stranding in the region of the distal inguinal canals, similar to prior. No soft tissue gas identified. Other:  There is no ascites or focal abdominal wall hernia. Musculoskeletal: No acute osseous abnormality. Stable chronic appearing compression deformity of L4. IMPRESSION: 1. Scrotal wall edema and inflammatory stranding in the region of the distal inguinal canals, similar to prior. Findings may be related to cellulitis. No soft tissue gas identified. 2. Stable enlarged left external iliac chain and bilateral inguinal lymph nodes. 3. Diffuse bladder wall thickening, possibly cystitis. Correlate with urinalysis. 4. Colonic diverticulosis. Electronically Signed   By: Darliss Cheney M.D.   On: 10/27/2022 19:24   DG Chest Portable 1 View  Result Date: 10/27/2022 CLINICAL DATA:  Sepsis. EXAM: PORTABLE CHEST 1 VIEW COMPARISON:  Chest radiograph dated January 20, 2022. FINDINGS: The heart size and mediastinal contours are within normal limits. Both lungs are clear. No pneumothorax or pleural effusion. No acute osseous abnormality. IMPRESSION: No acute cardiopulmonary findings. Electronically Signed   By: Hart Robinsons M.D.   On: 10/27/2022 16:56     Pamella Pert, MD, PhD [facility]  Between 7 am - 7 pm I am available, please contact me via Amion (for emergencies) or Securechat (non urgent messages)  Between 7 pm - 7 am I am not available, please contact night coverage MD/APP via Amion

## 2022-10-29 DIAGNOSIS — N492 Inflammatory disorders of scrotum: Secondary | ICD-10-CM | POA: Diagnosis not present

## 2022-10-29 LAB — COMPREHENSIVE METABOLIC PANEL
ALT: 15 U/L (ref 0–44)
AST: 14 U/L — ABNORMAL LOW (ref 15–41)
Albumin: 3.3 g/dL — ABNORMAL LOW (ref 3.5–5.0)
Alkaline Phosphatase: 65 U/L (ref 38–126)
Anion gap: 10 (ref 5–15)
BUN: 10 mg/dL (ref 6–20)
CO2: 26 mmol/L (ref 22–32)
Calcium: 9.4 mg/dL (ref 8.9–10.3)
Chloride: 99 mmol/L (ref 98–111)
Creatinine, Ser: 0.82 mg/dL (ref 0.61–1.24)
GFR, Estimated: >60 mL/min (ref >60)
Glucose, Bld: 91 mg/dL (ref 70–99)
Potassium: 4.6 mmol/L (ref 3.5–5.1)
Sodium: 135 mmol/L (ref 135–145)
Total Bilirubin: 0.3 mg/dL (ref 0.3–1.2)
Total Protein: 6.6 g/dL (ref 6.5–8.1)

## 2022-10-29 LAB — CBC
HCT: 40.8 % (ref 39.0–52.0)
Hemoglobin: 13.7 g/dL (ref 13.0–17.0)
MCH: 31.8 pg (ref 26.0–34.0)
MCHC: 33.6 g/dL (ref 30.0–36.0)
MCV: 94.7 fL (ref 80.0–100.0)
Platelets: 353 10*3/uL (ref 150–400)
RBC: 4.31 MIL/uL (ref 4.22–5.81)
RDW: 12.1 % (ref 11.5–15.5)
WBC: 7.8 10*3/uL (ref 4.0–10.5)
nRBC: 0 % (ref 0.0–0.2)

## 2022-10-29 LAB — MAGNESIUM: Magnesium: 2.1 mg/dL (ref 1.7–2.4)

## 2022-10-29 MED ORDER — CEPHALEXIN 500 MG PO CAPS: 500.0000 mg | ORAL_CAPSULE | Freq: Two times a day (BID) | ORAL | 0 refills | Status: AC

## 2022-10-29 MED ORDER — VANCOMYCIN HCL 1250 MG/250ML IV SOLN: 1250.0000 mg | Freq: Two times a day (BID) | INTRAVENOUS | Status: DC

## 2022-10-29 MED ORDER — DOXYCYCLINE HYCLATE 100 MG PO CAPS: 100.0000 mg | ORAL_CAPSULE | Freq: Two times a day (BID) | ORAL | 0 refills | Status: AC

## 2022-10-29 MED ORDER — CEFTRIAXONE SODIUM 2 G IJ SOLR
2.0000 g | INTRAMUSCULAR | Status: AC
  Administered 2022-10-29: 2 g via INTRAVENOUS
  Filled 2022-10-29: qty 20

## 2022-10-29 NOTE — Discharge Instructions (Addendum)
Hold Farxiga for now, please discuss with your PCP and / or cardiology when to resume, but preferably after your infection is completely resolved  Take Doxycycline and Keflex (cephalexin) for total of 14 days. Start Doxycycline this evening 10/27, and Cephalexin tomorrow.  Follow with Ivonne Andrew, NP in 5-7 days  Please get a complete blood count and chemistry panel checked by your Primary MD at your next visit, and again as instructed by your Primary MD. Please get your medications reviewed and adjusted by your Primary MD.  Please request your Primary MD to go over all Hospital Tests and Procedure/Radiological results at the follow up, please get all Hospital records sent to your Prim MD by signing hospital release before you go home.  In some cases, there will be blood work, cultures and biopsy results pending at the time of your discharge. Please request that your primary care M.D. goes through all the records of your hospital data and follows up on these results.  If you had Pneumonia of Lung problems at the Hospital: Please get a 2 view Chest X ray done in 6-8 weeks after hospital discharge or sooner if instructed by your Primary MD.  If you have Congestive Heart Failure: Please call your Cardiologist or Primary MD anytime you have any of the following symptoms:  1) 3 pound weight gain in 24 hours or 5 pounds in 1 week  2) shortness of breath, with or without a dry hacking cough  3) swelling in the hands, feet or stomach  4) if you have to sleep on extra pillows at night in order to breathe  Follow cardiac low salt diet and 1.5 lit/day fluid restriction.  If you have diabetes Accuchecks 4 times/day, Once in AM empty stomach and then before each meal. Log in all results and show them to your primary doctor at your next visit. If any glucose reading is under 80 or above 300 call your primary MD immediately.  If you have Seizure/Convulsions/Epilepsy: Please do not drive, operate  heavy machinery, participate in activities at heights or participate in high speed sports until you have seen by Primary MD or a Neurologist and advised to do so again. Per [facility] statutes, patients with seizures are not allowed to drive until they have been seizure-free for six months.  Use caution when using heavy equipment or power tools. Avoid working on ladders or at heights. Take showers instead of baths. Ensure the water temperature is not too high on the home water heater. Do not go swimming alone. Do not lock yourself in a room alone (i.e. bathroom). When caring for infants or small children, sit down when holding, feeding, or changing them to minimize risk of injury to the child in the event you have a seizure. Maintain good sleep hygiene. Avoid alcohol.   If you had Gastrointestinal Bleeding: Please ask your Primary MD to check a complete blood count within one week of discharge or at your next visit. Your endoscopic/colonoscopic biopsies that are pending at the time of discharge, will also need to followed by your Primary MD.  Get Medicines reviewed and adjusted. Please take all your medications with you for your next visit with your Primary MD  Please request your Primary MD to go over all hospital tests and procedure/radiological results at the follow up, please ask your Primary MD to get all Hospital records sent to his/her office.  If you experience worsening of your admission symptoms, develop shortness of breath, life  threatening emergency, suicidal or homicidal thoughts you must seek medical attention immediately by calling 911 or calling your MD immediately  if symptoms less severe.  You must read complete instructions/literature along with all the possible adverse reactions/side effects for all the Medicines you take and that have been prescribed to you. Take any new Medicines after you have completely understood and accpet all the possible adverse reactions/side  effects.   Do not drive or operate heavy machinery when taking Pain medications.   Do not take more than prescribed Pain, Sleep and Anxiety Medications  Special Instructions: If you have smoked or chewed Tobacco  in the last 2 yrs please stop smoking, stop any regular Alcohol  and or any Recreational drug use.  Wear Seat belts while driving.  Please note You were cared for by a hospitalist during your hospital stay. If you have any questions about your discharge medications or the care you received while you were in the hospital after you are discharged, you can call the unit and asked to speak with the hospitalist on call if the hospitalist that took care of you is not available. Once you are discharged, your primary care physician will handle any further medical issues. Please note that NO REFILLS for any discharge medications will be authorized once you are discharged, as it is imperative that you return to your primary care physician (or establish a relationship with a primary care physician if you do not have one) for your aftercare needs so that they can reassess your need for medications and monitor your lab values.  You can reach the hospitalist office at phone [phone] or fax [phone]   If you do not have a primary care physician, you can call [phone] for a physician referral.  Activity: As tolerated with Full fall precautions use walker/cane & assistance as needed    Diet: heart healthy  Disposition Home

## 2022-10-29 NOTE — Discharge Summary (Signed)
Physician Discharge Summary  Aurthur Landry PYP:[id number] DOB: [birth year] DOA: 10/27/2022  PCP: Ivonne Andrew, NP  Admit date: 10/27/2022 Discharge date: 10/29/2022  Admitted From: home Disposition:  home  Recommendations for Outpatient Follow-up:  Follow up with PCP in 1-2 weeks  Home Health: none Equipment/Devices: none  Discharge Condition: stable CODE STATUS: Full code Diet Orders (From admission, onward)     Start     Ordered   10/27/22 2217  Diet Heart Fluid consistency: Thin  Diet effective now       Question:  Fluid consistency:  Answer:  Thin   10/27/22 2217            HPI: Per admitting MD, Caleb Landry is a 52 y.o. male with medical history significant for chronic systolic CHF (EF improved to 40-45% from 20-25%), asthma, tobacco use who presented to the ED for evaluation of worsening scrotal infection. Patient states about 5 days ago he noticed swelling and pain involving the underside of his scrotum.  Around this time he also noted swelling and pain near the PIP of the right middle finger. Patient was initially seen in the ED on 10/23 for abscess to his right middle finger into the scrotum.  I&D of the right middle finger abscess was performed by the ED provider.  CT pelvis did not show soft tissue gas or abscess to suggest necrotizing fasciitis.  Patient was given IV vancomycin and ceftriaxone in the ED and he was discharged to home on doxycycline and Bactrim.  Admission was offered but patient declined. Patient states he has been taking antibiotics as prescribed however he has had increased pain and purulent discharge from the scrotal wound site.  He says right middle finger site has been healing but still seeing some purulent discharge.  ROM is completely intact. He denies fevers, chills, diaphoresis, chest pain, dyspnea.  He reports good urinary output without penile discharge. He reports smoking about 1/3 pack of cigarettes daily.  Hospital Course / Discharge  diagnoses: Principal Problem:   Cellulitis of scrotum Active Problems:   Chronic systolic heart failure (HCC)   Hypokalemia   Asthma   Principal problem Scrotal cellulitis -with superficial purulent discharge, erythema despite outpatient antibiotics.  CT of the pelvis without evidence of soft tissue gas. Urology consulted and followed patient while hospitalized. He was initially place on Iv antibiotics with significant improvement in his cellulitis. Discussed with Dr Cardell Peach, with improvement, OK for oral antibiotics for 2 weeks and discharge home. His WBC normalized, he is afebrile and feels improved. He will be transitioned to oral antibiotics and dc home in stable condition.    Active problems Chronic systolic CHF-currently appears euvolemic, latest EF showed improvement from 20-25% to 40-45%.  Continue home medications except Marcelline Deist given skin infection  Hypokalemia -supplemented, normalized.  History of asthma-no wheezing, continue albuterol Right middle finger abscess-status post I&D in the emergency room 10/23, appears to be healing  Sepsis ruled out   Discharge Instructions   Allergies as of 10/29/2022       Reactions   Penicillins Hives        Medication List     STOP taking these medications    dapagliflozin propanediol 10 MG Tabs tablet Commonly known as: Farxiga   sulfamethoxazole-trimethoprim 800-160 MG tablet Commonly known as: BACTRIM DS       TAKE these medications    albuterol (2.5 MG/3ML) 0.083% nebulizer solution Commonly known as: PROVENTIL Take 3 mLs (2.5 mg total) by nebulization every 6 (  six) hours as needed for wheezing or shortness of breath.   albuterol 108 (90 Base) MCG/ACT inhaler Commonly known as: VENTOLIN HFA INHALE 2 PUFFS INTO THE LUNGS EVERY 6 HOURS AS NEEDED FOR WHEEZING OR SHORTNESS OF BREATH   cephALEXin 500 MG capsule Commonly known as: KEFLEX Take 1 capsule (500 mg total) by mouth 2 (two) times daily for 14 days.    Descovy 200-25 MG tablet Generic drug: emtricitabine-tenofovir AF Take 1 tablet by mouth daily.   doxycycline 100 MG capsule Commonly known as: VIBRAMYCIN Take 1 capsule (100 mg total) by mouth 2 (two) times daily for 6 days. Take for a total of 14 days, including your prior prescription What changed: additional instructions   Entresto 97-103 MG Generic drug: sacubitril-valsartan Take 1 tablet by mouth 2 (two) times daily.   furosemide 40 MG tablet Commonly known as: LASIX Take 1 tablet (40 mg total) by mouth daily as needed.   gabapentin 300 MG capsule Commonly known as: NEURONTIN Take 2 capsules (600 mg total) by mouth 2 (two) times daily.       Consultations: Urology   Procedures/Studies:  CT PELVIS W CONTRAST  Result Date: 10/27/2022 CLINICAL DATA:  Scrotal mass. EXAM: CT PELVIS WITH CONTRAST TECHNIQUE: Multidetector CT imaging of the pelvis was performed using the standard protocol following the bolus administration of intravenous contrast. RADIATION DOSE REDUCTION: This exam was performed according to the departmental dose-optimization program which includes automated exposure control, adjustment of the mA and/or kV according to patient size and/or use of iterative reconstruction technique. CONTRAST:  75mL OMNIPAQUE IOHEXOL 350 MG/ML SOLN COMPARISON:  Pelvis CT 10/26/2022 FINDINGS: Urinary Tract: There is diffuse bladder wall thickening. There is no surrounding inflammation. Bowel: No dilated bowel loops are seen. Colonic diverticula are noted. No focal bowel inflammation. Vascular/Lymphatic: No acute vascular abnormality. There is an enlarged left external iliac chain lymph node measuring 11 mm short axis. There are enlarged bilateral inguinal lymph nodes measuring up to 13 mm short axis. These are similar to prior. Reproductive: Prostate gland is within normal limits. There is scrotal wall edema and inflammatory stranding in the region of the distal inguinal canals, similar  to prior. No soft tissue gas identified. Other:  There is no ascites or focal abdominal wall hernia. Musculoskeletal: No acute osseous abnormality. Stable chronic appearing compression deformity of L4. IMPRESSION: 1. Scrotal wall edema and inflammatory stranding in the region of the distal inguinal canals, similar to prior. Findings may be related to cellulitis. No soft tissue gas identified. 2. Stable enlarged left external iliac chain and bilateral inguinal lymph nodes. 3. Diffuse bladder wall thickening, possibly cystitis. Correlate with urinalysis. 4. Colonic diverticulosis. Electronically Signed   By: Darliss Cheney M.D.   On: 10/27/2022 19:24   DG Chest Portable 1 View  Result Date: 10/27/2022 CLINICAL DATA:  Sepsis. EXAM: PORTABLE CHEST 1 VIEW COMPARISON:  Chest radiograph dated January 20, 2022. FINDINGS: The heart size and mediastinal contours are within normal limits. Both lungs are clear. No pneumothorax or pleural effusion. No acute osseous abnormality. IMPRESSION: No acute cardiopulmonary findings. Electronically Signed   By: Hart Robinsons M.D.   On: 10/27/2022 16:56   DG Hand Complete Right  Result Date: 10/26/2022 CLINICAL DATA:  Third digit abscess, initial encounter EXAM: RIGHT HAND - COMPLETE 3+ VIEW COMPARISON:  None Available. FINDINGS: Mild soft tissue swelling is noted in the third digit. No bony erosive change is seen. No acute fracture or dislocation is noted. IMPRESSION: Soft tissue swelling proximally  without bony abnormality. Electronically Signed   By: Alcide Clever M.D.   On: 10/26/2022 01:41   CT PELVIS W CONTRAST  Result Date: 10/26/2022 CLINICAL DATA:  Soft tissue infection suspected in the pelvis. Scrotal infection. EXAM: CT PELVIS WITH CONTRAST TECHNIQUE: Multidetector CT imaging of the pelvis was performed using the standard protocol following the bolus administration of intravenous contrast. RADIATION DOSE REDUCTION: This exam was performed according to the  departmental dose-optimization program which includes automated exposure control, adjustment of the mA and/or kV according to patient size and/or use of iterative reconstruction technique. CONTRAST:  75mL OMNIPAQUE IOHEXOL 350 MG/ML SOLN COMPARISON:  Scrotal ultrasound 06/14/2021 FINDINGS: Urinary Tract:  No abnormality visualized. Bowel: Diverticulosis of the sigmoid colon without evidence of acute diverticulitis. Visualized small bowel and colon are otherwise unremarkable. Mild groin lymphadenopathy, greater on the right. Largest lymph nodes measure about 1.5 cm short axis dimension. Likely reactive. Vascular/Lymphatic: Iliac and external iliac vascular calcifications are present. No aneurysm. Reproductive: Prostate gland is not enlarged. Scrotal skin thickening is suggested, possibly cellulitis. No soft tissue gas or loculated collections identified. Other:  No free air or free fluid. Musculoskeletal: Degenerative changes in the lower lumbar spine and hips. Compression deformity at L5 with about 40% loss of height, likely chronic. No acute bony abnormality suggested. IMPRESSION: 1. Scrotal skin thickening may indicate edema or cellulitis. No soft tissue gas or abscess to suggest necrotizing fasciitis. 2. Bilateral groin lymphadenopathy is probably reactive. 3. Iliac vascular calcifications consistent with atherosclerosis. Electronically Signed   By: Burman Nieves M.D.   On: 10/26/2022 01:26     Subjective: - no chest pain, shortness of breath, no abdominal pain, nausea or vomiting.   Discharge Exam: BP 122/86 (BP Location: Right Arm)   Pulse 92   Temp 98.1 F (36.7 C) (Oral)   Resp 17   Ht 5\' 10"  (1.778 m)   Wt 81.6 kg   SpO2 96%   BMI 25.81 kg/m   General: Pt is alert, awake, not in acute distress Cardiovascular: RRR, S1/S2 +, no rubs, no gallops Respiratory: CTA bilaterally, no wheezing, no rhonchi Abdominal: Soft, NT, ND, bowel sounds + Extremities: no edema, no cyanosis  The results  of significant diagnostics from this hospitalization (including imaging, microbiology, ancillary and laboratory) are listed below for reference.     Microbiology: Recent Results (from the past 240 hour(s))  Urine Culture     Status: Abnormal   Collection Time: 10/25/22  3:11 AM   Specimen: Urine, Random  Result Value Ref Range Status   Specimen Description URINE, RANDOM  Final   Special Requests NONE  Final   Culture (A)  Final    <10,000 COLONIES/mL INSIGNIFICANT GROWTH Performed at [facility], [address] [place], [place] [address]    Report Status 10/27/2022 FINAL  Final  Blood Culture (routine x 2)     Status: None (Preliminary result)   Collection Time: 10/26/22 12:05 AM   Specimen: BLOOD RIGHT ARM  Result Value Ref Range Status   Specimen Description BLOOD RIGHT ARM  Final   Special Requests   Final    BOTTLES DRAWN AEROBIC AND ANAEROBIC Blood Culture results may not be optimal due to an excessive volume of blood received in culture bottles   Culture   Final    NO GROWTH 3 DAYS Performed at [facility], [address] [place], [place] [address]    Report Status PENDING  Incomplete  Blood Culture (routine x  2)     Status: None (Preliminary result)   Collection Time: 10/26/22 12:13 AM   Specimen: BLOOD LEFT ARM  Result Value Ref Range Status   Specimen Description BLOOD LEFT ARM  Final   Special Requests   Final    BOTTLES DRAWN AEROBIC AND ANAEROBIC Blood Culture adequate volume   Culture   Final    NO GROWTH 3 DAYS Performed at [facility], [address] [place], [place] [address]    Report Status PENDING  Incomplete  Blood culture (routine x 2)     Status: None (Preliminary result)   Collection Time: 10/27/22  3:25 PM   Specimen: BLOOD  Result Value Ref Range Status   Specimen Description BLOOD LEFT ANTECUBITAL  Final   Special Requests   Final    BOTTLES DRAWN AEROBIC AND ANAEROBIC Blood Culture adequate volume   Culture   Final     NO GROWTH 2 DAYS Performed at [facility], [address] [place], [place] [address]    Report Status PENDING  Incomplete  Blood culture (routine x 2)     Status: None (Preliminary result)   Collection Time: 10/27/22  3:35 PM   Specimen: BLOOD RIGHT HAND  Result Value Ref Range Status   Specimen Description BLOOD RIGHT HAND  Final   Special Requests   Final    BOTTLES DRAWN AEROBIC AND ANAEROBIC Blood Culture adequate volume   Culture   Final    NO GROWTH 2 DAYS Performed at [facility], [address] [place], [place] [address]    Report Status PENDING  Incomplete     Labs: Basic Metabolic Panel: Recent Labs  Lab 10/25/22 2051 10/27/22 1451 10/28/22 0423 10/29/22 0829  NA 140 139 139 135  K 3.7 2.9* 4.0 4.6  CL 101 99 103 99  CO2 30 24 26 26   GLUCOSE 112* 146* 106* 91  BUN 6 8 9 10   CREATININE 0.78 1.13 0.84 0.82  CALCIUM 9.2 9.0 9.0 9.4  MG  --  1.9  --  2.1   Liver Function Tests: Recent Labs  Lab 10/25/22 2051 10/27/22 1451 10/29/22 0829  AST 22 20 14*  ALT 17 15 15   ALKPHOS 89 73 65  BILITOT 0.7 0.4 0.3  PROT 7.1 7.0 6.6  ALBUMIN 3.7 3.5 3.3*   CBC: Recent Labs  Lab 10/25/22 2051 10/27/22 1451 10/28/22 0423 10/29/22 0829  WBC 14.5* 8.4 7.0 7.8  NEUTROABS  --  6.0  --   --   HGB 13.4 13.0 11.4* 13.7  HCT 39.7 39.9 34.0* 40.8  MCV 96.1 96.1 94.2 94.7  PLT 357 332 276 353   CBG: No results for input(s): "GLUCAP" in the last 168 hours. Hgb A1c No results for input(s): "HGBA1C" in the last 72 hours. Lipid Profile No results for input(s): "CHOL", "HDL", "LDLCALC", "TRIG", "CHOLHDL", "LDLDIRECT" in the last 72 hours. Thyroid function studies No results for input(s): "TSH", "T4TOTAL", "T3FREE", "THYROIDAB" in the last 72 hours.  Invalid input(s): "FREET3" Urinalysis    Component Value Date/Time   COLORURINE YELLOW 10/25/2022 2317   APPEARANCEUR CLEAR 10/25/2022 2317   LABSPEC 1.039 (H) 10/25/2022 2317   PHURINE 5.0 10/25/2022  2317   GLUCOSEU >=500 (A) 10/25/2022 2317   HGBUR NEGATIVE 10/25/2022 2317   BILIRUBINUR NEGATIVE 10/25/2022 2317   BILIRUBINUR negative 04/05/2021 1409   KETONESUR NEGATIVE 10/25/2022 2317   PROTEINUR NEGATIVE 10/25/2022 2317   UROBILINOGEN 0.2 04/05/2021 1409  NITRITE NEGATIVE 10/25/2022 2317   LEUKOCYTESUR NEGATIVE 10/25/2022 2317    FURTHER DISCHARGE INSTRUCTIONS:   Get Medicines reviewed and adjusted: Please take all your medications with you for your next visit with your Primary MD   Laboratory/radiological data: Please request your Primary MD to go over all hospital tests and procedure/radiological results at the follow up, please ask your Primary MD to get all Hospital records sent to his/her office.   In some cases, they will be blood work, cultures and biopsy results pending at the time of your discharge. Please request that your primary care M.D. goes through all the records of your hospital data and follows up on these results.   Also Note the following: If you experience worsening of your admission symptoms, develop shortness of breath, life threatening emergency, suicidal or homicidal thoughts you must seek medical attention immediately by calling 911 or calling your MD immediately  if symptoms less severe.   You must read complete instructions/literature along with all the possible adverse reactions/side effects for all the Medicines you take and that have been prescribed to you. Take any new Medicines after you have completely understood and accpet all the possible adverse reactions/side effects.    Do not drive when taking Pain medications or sleeping medications (Benzodaizepines)   Do not take more than prescribed Pain, Sleep and Anxiety Medications. It is not advisable to combine anxiety,sleep and pain medications without talking with your primary care practitioner   Special Instructions: If you have smoked or chewed Tobacco  in the last 2 yrs please stop smoking,  stop any regular Alcohol  and or any Recreational drug use.   Wear Seat belts while driving.   Please note: You were cared for by a hospitalist during your hospital stay. Once you are discharged, your primary care physician will handle any further medical issues. Please note that NO REFILLS for any discharge medications will be authorized once you are discharged, as it is imperative that you return to your primary care physician (or establish a relationship with a primary care physician if you do not have one) for your post hospital discharge needs so that they can reassess your need for medications and monitor your lab values.  Time coordinating discharge: 35 minutes  SIGNED:  Pamella Pert, MD, PhD 10/29/2022, 11:26 AM

## 2022-10-29 NOTE — Progress Notes (Signed)
  Subjective: Denies pain, no drainage from scrotum. Afebrile.  Objective: Vital signs in last 24 hours: Temp:  [97.7 F (36.5 C)-98.4 F (36.9 C)] 98.1 F (36.7 C) (10/27 0710) Pulse Rate:  [76-92] 92 (10/27 0710) Resp:  [17-18] 17 (10/27 0710) BP: (116-124)/(79-86) 122/86 (10/27 0710) SpO2:  [95 %-97 %] 96 % (10/27 0710)  Intake/Output from previous day: 10/26 0701 - 10/27 0700 In: 1020.1 [P.O.:720; IV Piggyback:300.1] Out: 600 [Urine:600] Intake/Output this shift: No intake/output data recorded.  Physical Exam:  General: Alert and oriented CV: RRR Lungs: Clear Abdomen: Soft, ND, NT Inferior scrotum and perineum with decreased induration and erythema  Ext: NT, No erythema  Lab Results: Recent Labs    10/27/22 1451 10/28/22 0423 10/29/22 0829  HGB 13.0 11.4* 13.7  HCT 39.9 34.0* 40.8   BMET Recent Labs    10/27/22 1451 10/28/22 0423  NA 139 139  K 2.9* 4.0  CL 99 103  CO2 24 26  GLUCOSE 146* 106*  BUN 8 9  CREATININE 1.13 0.84  CALCIUM 9.0 9.0     Studies/Results: CT PELVIS W CONTRAST  Result Date: 10/27/2022 CLINICAL DATA:  Scrotal mass. EXAM: CT PELVIS WITH CONTRAST TECHNIQUE: Multidetector CT imaging of the pelvis was performed using the standard protocol following the bolus administration of intravenous contrast. RADIATION DOSE REDUCTION: This exam was performed according to the departmental dose-optimization program which includes automated exposure control, adjustment of the mA and/or kV according to patient size and/or use of iterative reconstruction technique. CONTRAST:  75mL OMNIPAQUE IOHEXOL 350 MG/ML SOLN COMPARISON:  Pelvis CT 10/26/2022 FINDINGS: Urinary Tract: There is diffuse bladder wall thickening. There is no surrounding inflammation. Bowel: No dilated bowel loops are seen. Colonic diverticula are noted. No focal bowel inflammation. Vascular/Lymphatic: No acute vascular abnormality. There is an enlarged left external iliac chain lymph node  measuring 11 mm short axis. There are enlarged bilateral inguinal lymph nodes measuring up to 13 mm short axis. These are similar to prior. Reproductive: Prostate gland is within normal limits. There is scrotal wall edema and inflammatory stranding in the region of the distal inguinal canals, similar to prior. No soft tissue gas identified. Other:  There is no ascites or focal abdominal wall hernia. Musculoskeletal: No acute osseous abnormality. Stable chronic appearing compression deformity of L4. IMPRESSION: 1. Scrotal wall edema and inflammatory stranding in the region of the distal inguinal canals, similar to prior. Findings may be related to cellulitis. No soft tissue gas identified. 2. Stable enlarged left external iliac chain and bilateral inguinal lymph nodes. 3. Diffuse bladder wall thickening, possibly cystitis. Correlate with urinalysis. 4. Colonic diverticulosis. Electronically Signed   By: Darliss Cheney M.D.   On: 10/27/2022 19:24   DG Chest Portable 1 View  Result Date: 10/27/2022 CLINICAL DATA:  Sepsis. EXAM: PORTABLE CHEST 1 VIEW COMPARISON:  Chest radiograph dated January 20, 2022. FINDINGS: The heart size and mediastinal contours are within normal limits. Both lungs are clear. No pneumothorax or pleural effusion. No acute osseous abnormality. IMPRESSION: No acute cardiopulmonary findings. Electronically Signed   By: Hart Robinsons M.D.   On: 10/27/2022 16:56    Assessment/Plan: Scrotal cellulitis:   No underlying abscess to drain. Cellulitis improving. Can likely transition home today or tomorrow on 2 week course of PO abx. Discussed return precautions if he develops an area of abscess that would require drainage.   LOS: 2 days   Matt R. Nelson Julson MD 10/29/2022, 8:58 AM [facility]  Pager: [phone]

## 2022-11-01 LAB — CULTURE, BLOOD (ROUTINE X 2)
Culture: NO GROWTH
Culture: NO GROWTH
Culture: NO GROWTH
Culture: NO GROWTH
Special Requests: ADEQUATE
Special Requests: ADEQUATE
Special Requests: ADEQUATE

## 2022-11-08 ENCOUNTER — Ambulatory Visit: Payer: Self-pay | Admitting: Nurse Practitioner

## 2022-11-13 ENCOUNTER — Encounter: Payer: Self-pay | Admitting: Nurse Practitioner

## 2022-11-13 ENCOUNTER — Ambulatory Visit (INDEPENDENT_AMBULATORY_CARE_PROVIDER_SITE_OTHER): Payer: BLUE CROSS/BLUE SHIELD | Admitting: Nurse Practitioner

## 2022-11-13 ENCOUNTER — Other Ambulatory Visit: Payer: Self-pay | Admitting: Nurse Practitioner

## 2022-11-13 VITALS — BP 132/86 | HR 111 | Ht 70.0 in | Wt 172.0 lb

## 2022-11-13 DIAGNOSIS — G629 Polyneuropathy, unspecified: Secondary | ICD-10-CM

## 2022-11-13 DIAGNOSIS — J4520 Mild intermittent asthma, uncomplicated: Secondary | ICD-10-CM | POA: Diagnosis not present

## 2022-11-13 DIAGNOSIS — N492 Inflammatory disorders of scrotum: Secondary | ICD-10-CM | POA: Diagnosis not present

## 2022-11-13 MED ORDER — GABAPENTIN 300 MG PO CAPS: 600.0000 mg | ORAL_CAPSULE | Freq: Two times a day (BID) | ORAL | 5 refills | Status: DC

## 2022-11-13 MED ORDER — ENTRESTO 97-103 MG PO TABS: 1.0000 | ORAL_TABLET | Freq: Two times a day (BID) | ORAL | 3 refills | Status: AC

## 2022-11-13 MED ORDER — ALBUTEROL SULFATE HFA 108 (90 BASE) MCG/ACT IN AERS: 2.0000 | INHALATION_SPRAY | Freq: Four times a day (QID) | RESPIRATORY_TRACT | 2 refills | Status: AC | PRN

## 2022-11-13 MED ORDER — NEBULIZER/TUBING/MOUTHPIECE KIT: 1.0000 | PACK | 0 refills | Status: AC | PRN

## 2022-11-13 NOTE — Progress Notes (Unsigned)
Subjective   Patient ID: Caleb Landry, male    DOB: 1970/02/14, 52 y.o.   MRN: [id number]  No chief complaint on file.   Referring provider: Ivonne Andrew, NP  Leanard Ishee is a 52 y.o. male with Past Medical History: No date: Asthma No date: Neuropathy  HPI: HPI  Allergies  Allergen Reactions  . Penicillins Hives    Immunization History  Administered Date(s) Administered  . Hepatitis A, Adult 04/17/2022  . Hepb-cpg 04/17/2022, 06/08/2022  . Influenza-Unspecified 10/17/2021  . Moderna Covid-19 Fall Seasonal Vaccine 92yrs & older 11/04/2021  . PNEUMOCOCCAL CONJUGATE-20 10/17/2021  . Tdap 11/18/2021  . Zoster Recombinant(Shingrix) 01/06/2022    Tobacco History: Social History   Tobacco Use  Smoking Status Every Day  . Current packs/day: 0.00  . Average packs/day: 0.5 packs/day for 30.0 years (15.0 ttl pk-yrs)  . Types: Cigarettes  . Start date: 08/14/1990  . Last attempt to quit: 08/13/2020  . Years since quitting: 2.2  . Passive exposure: Current  Smokeless Tobacco Never   Ready to quit: Not Answered Counseling given: Not Answered   Outpatient Encounter Medications as of 11/13/2022  Medication Sig  . Respiratory Therapy Supplies (NEBULIZER/TUBING/MOUTHPIECE) KIT 1 each by Does not apply route as needed. DX: Asthma; J45.909  . albuterol (PROVENTIL) (2.5 MG/3ML) 0.083% nebulizer solution Take 3 mLs (2.5 mg total) by nebulization every 6 (six) hours as needed for wheezing or shortness of breath.  Marland Kitchen albuterol (VENTOLIN HFA) 108 (90 Base) MCG/ACT inhaler Inhale 2 puffs into the lungs every 6 (six) hours as needed for wheezing or shortness of breath.  Marland Kitchen emtricitabine-tenofovir AF (DESCOVY) 200-25 MG tablet Take 1 tablet by mouth daily. (Patient not taking: Reported on 10/27/2022)  . furosemide (LASIX) 40 MG tablet Take 1 tablet (40 mg total) by mouth daily as needed.  . gabapentin (NEURONTIN) 300 MG capsule Take 2 capsules (600 mg total) by mouth 2 (two) times  daily.  . sacubitril-valsartan (ENTRESTO) 97-103 MG Take 1 tablet by mouth 2 (two) times daily.  . [DISCONTINUED] albuterol (VENTOLIN HFA) 108 (90 Base) MCG/ACT inhaler INHALE 2 PUFFS INTO THE LUNGS EVERY 6 HOURS AS NEEDED FOR WHEEZING OR SHORTNESS OF BREATH  . [DISCONTINUED] gabapentin (NEURONTIN) 300 MG capsule Take 2 capsules (600 mg total) by mouth 2 (two) times daily. (Patient not taking: Reported on 10/27/2022)  . [DISCONTINUED] sacubitril-valsartan (ENTRESTO) 97-103 MG Take 1 tablet by mouth 2 (two) times daily.   No facility-administered encounter medications on file as of 11/13/2022.    Review of Systems  Review of Systems   Objective:   BP 132/86 (BP Location: Right Arm, Patient Position: Sitting, Cuff Size: Normal)   Pulse (!) 111   Ht 5\' 10"  (1.778 m)   Wt 172 lb (78 kg)   SpO2 95%   BMI 24.68 kg/m   Wt Readings from Last 5 Encounters:  11/13/22 172 lb (78 kg)  10/27/22 179 lb 14.3 oz (81.6 kg)  10/26/22 180 lb (81.6 kg)  06/08/22 178 lb 12.8 oz (81.1 kg)  02/21/22 198 lb 12.8 oz (90.2 kg)     Physical Exam  {Labs (Optional):23779}  Assessment & Plan:   Cellulitis of scrotum -     CBC -     Comprehensive metabolic panel  Neuropathy -     Gabapentin; Take 2 capsules (600 mg total) by mouth 2 (two) times daily.  Dispense: 120 capsule; Refill: 5  Mild intermittent asthma without complication -     Albuterol Sulfate HFA;  Inhale 2 puffs into the lungs every 6 (six) hours as needed for wheezing or shortness of breath.  Dispense: 8.5 g; Refill: 2 -     Nebulizer/Tubing/Mouthpiece; 1 each by Does not apply route as needed. DX: Asthma; J45.909  Dispense: 1 kit; Refill: 0  Other orders -     Entresto; Take 1 tablet by mouth 2 (two) times daily.  Dispense: 180 tablet; Refill: 3     No follow-ups on file.   Ivonne Andrew, NP 11/13/2022

## 2022-11-13 NOTE — Patient Instructions (Signed)
1. Neuropathy  - gabapentin (NEURONTIN) 300 MG capsule; Take 2 capsules (600 mg total) by mouth 2 (two) times daily.  Dispense: 120 capsule; Refill: 5  2. Mild intermittent asthma without complication  - albuterol (VENTOLIN HFA) 108 (90 Base) MCG/ACT inhaler; Inhale 2 puffs into the lungs every 6 (six) hours as needed for wheezing or shortness of breath.  Dispense: 8.5 g; Refill: 2 - Respiratory Therapy Supplies (NEBULIZER/TUBING/MOUTHPIECE) KIT; 1 each by Does not apply route as needed. DX: Asthma; J45.909  Dispense: 1 kit; Refill: 0  3. Cellulitis of scrotum  - CBC - Comprehensive metabolic panel  Follow up:  Follow up in 6 months

## 2022-11-14 LAB — COMPREHENSIVE METABOLIC PANEL
ALT: 23 [IU]/L (ref 0–44)
AST: 26 [IU]/L (ref 0–40)
Albumin: 4.3 g/dL (ref 3.8–4.9)
Alkaline Phosphatase: 142 [IU]/L — ABNORMAL HIGH (ref 44–121)
BUN/Creatinine Ratio: 13 (ref 9–20)
BUN: 12 mg/dL (ref 6–24)
Bilirubin Total: 0.6 mg/dL (ref 0.0–1.2)
CO2: 22 mmol/L (ref 20–29)
Calcium: 9 mg/dL (ref 8.7–10.2)
Chloride: 107 mmol/L — ABNORMAL HIGH (ref 96–106)
Creatinine, Ser: 0.9 mg/dL (ref 0.76–1.27)
Globulin, Total: 3.1 g/dL (ref 1.5–4.5)
Glucose: 93 mg/dL (ref 70–99)
Potassium: 4.4 mmol/L (ref 3.5–5.2)
Sodium: 142 mmol/L (ref 134–144)
Total Protein: 7.4 g/dL (ref 6.0–8.5)
eGFR: 103 mL/min/{1.73_m2} (ref >59)

## 2022-11-14 LAB — CBC
Hematocrit: 44.5 % (ref 37.5–51.0)
Hemoglobin: 13.9 g/dL (ref 13.0–17.7)
MCH: 27.3 pg (ref 26.6–33.0)
MCHC: 31.2 g/dL — ABNORMAL LOW (ref 31.5–35.7)
MCV: 87 fL (ref 79–97)
Platelets: 298 10*3/uL (ref 150–450)
RBC: 5.1 x10E6/uL (ref 4.14–5.80)
RDW: 13.7 % (ref 11.6–15.4)
WBC: 5.7 10*3/uL (ref 3.4–10.8)

## 2022-11-16 ENCOUNTER — Encounter: Payer: Self-pay | Admitting: Nurse Practitioner

## 2023-01-17 ENCOUNTER — Ambulatory Visit: Payer: BLUE CROSS/BLUE SHIELD | Admitting: Dermatology

## 2023-01-31 ENCOUNTER — Ambulatory Visit: Payer: BLUE CROSS/BLUE SHIELD | Admitting: Dermatology

## 2023-02-11 IMAGING — CT CT ANGIO CHEST
2 of 6 series · 18 of 36 positions shown · IV contrast (OMNIPAQUE 350)
Comparison: None.

CLINICAL DATA: Leg swelling.  Shortness of breath.

EXAM:
CT ANGIOGRAPHY CHEST WITH CONTRAST
TECHNIQUE: Multidetector CT imaging of the chest was performed using the
standard protocol during bolus administration of intravenous
contrast. Multiplanar CT image reconstructions and MIPs were
obtained to evaluate the vascular anatomy.
CONTRAST:  75mL OMNIPAQUE IOHEXOL 350 MG/ML SOLN

[Series 5: thins · axial · 0.72mm/px · z∈[-368,-82]mm · 17 of 322 slices shown]
[im 18/322  lung]
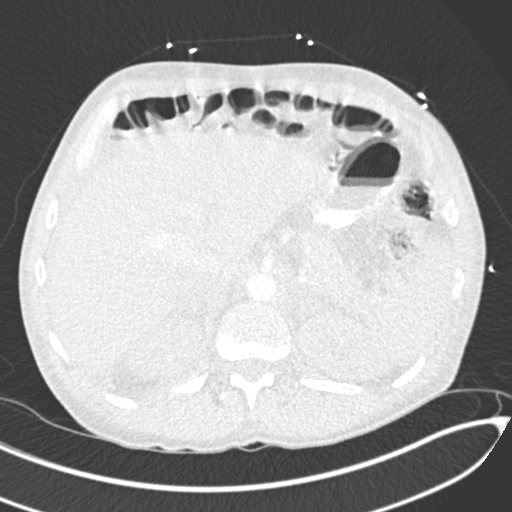
[im 36/322  mediastinal]
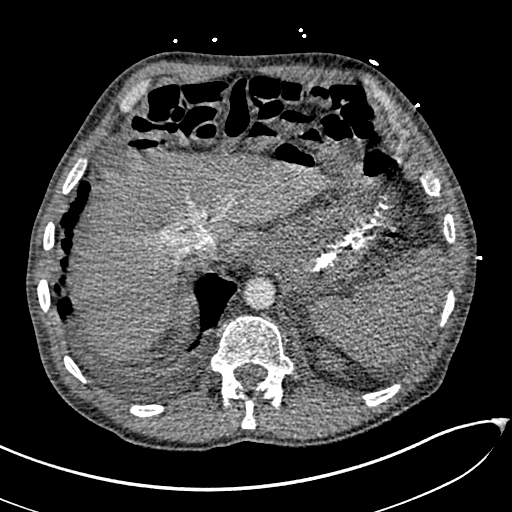
[im 54/322  lung]
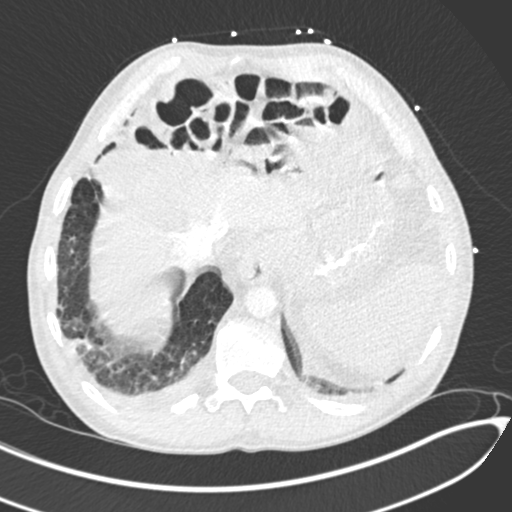
[im 72/322  mediastinal]
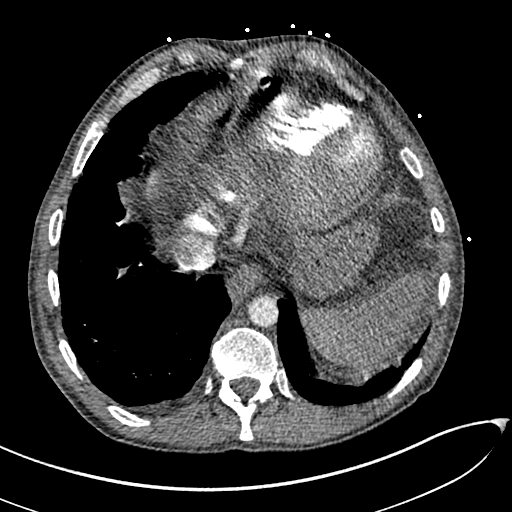
[im 90/322  lung]
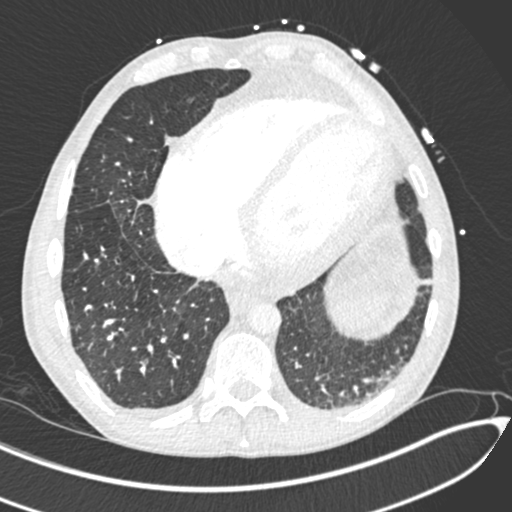
[im 108/322  mediastinal]
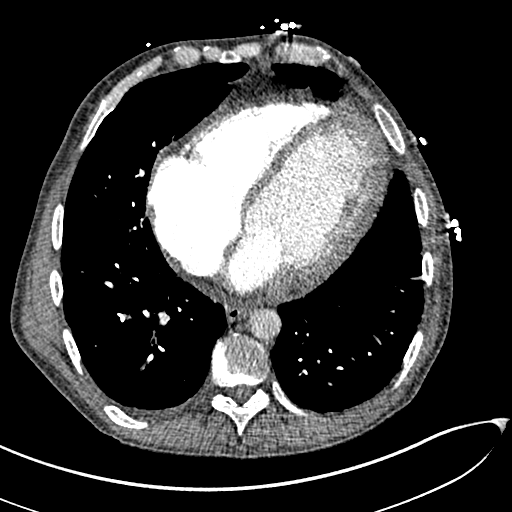
[im 125/322  lung]
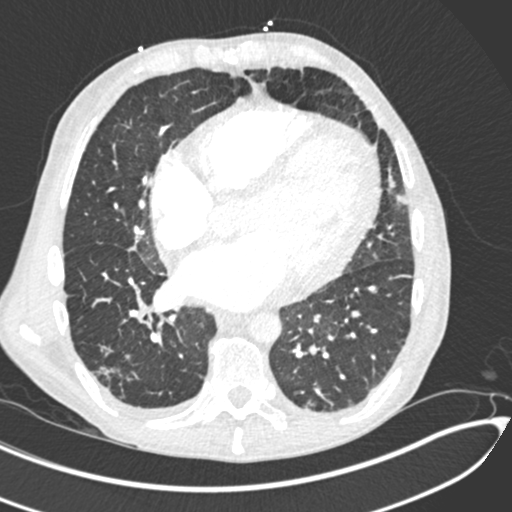
[im 143/322  mediastinal]
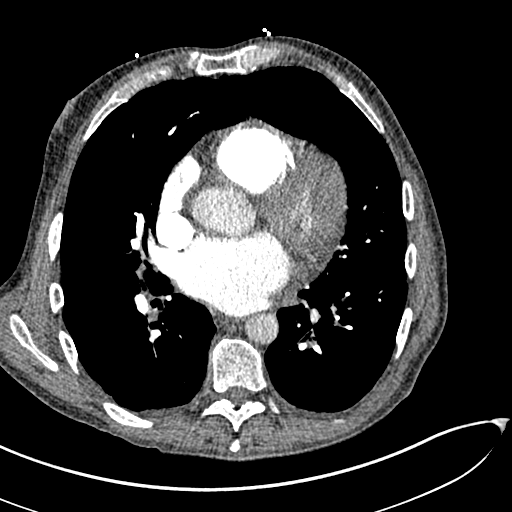
[im 161/322  lung]
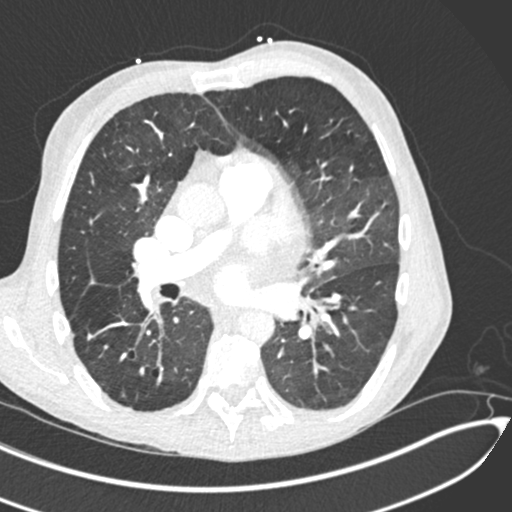
[im 179/322  mediastinal]
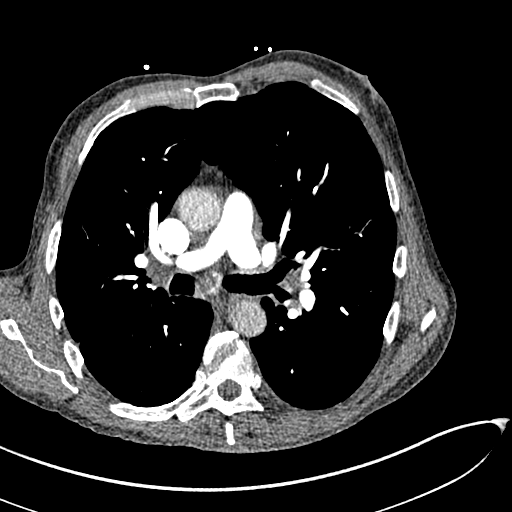
[im 197/322  lung]
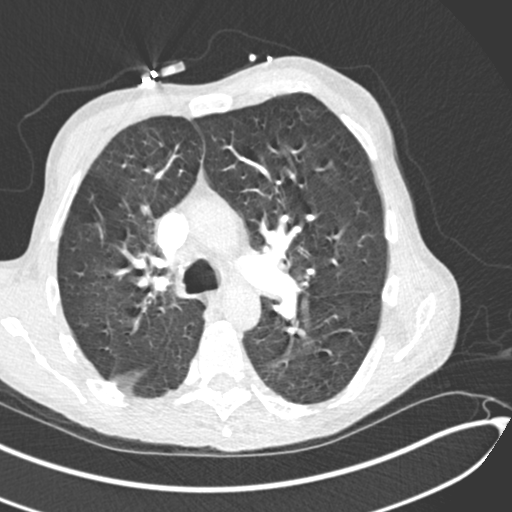
[im 215/322  mediastinal]
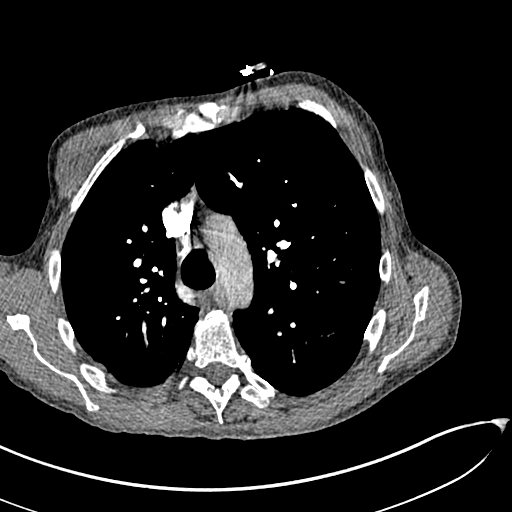
[im 232/322  lung]
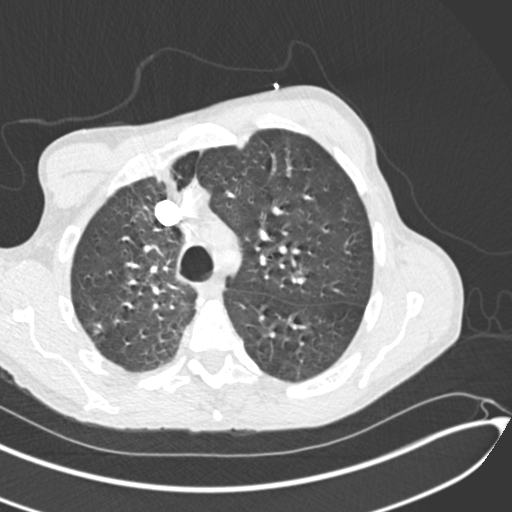
[im 250/322  mediastinal]
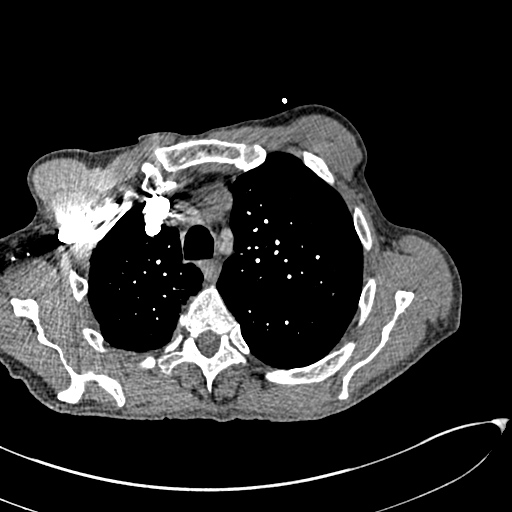
[im 268/322  lung]
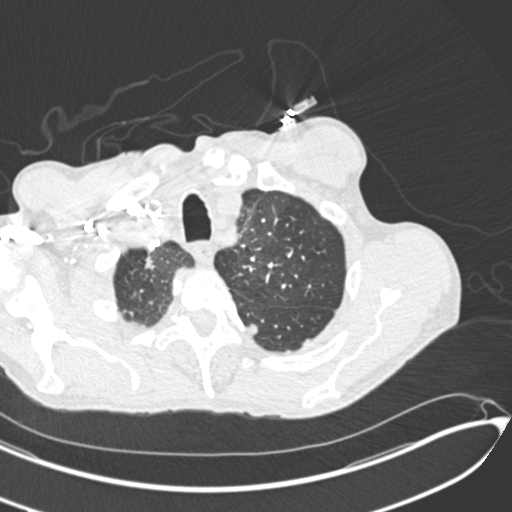
[im 286/322  mediastinal]
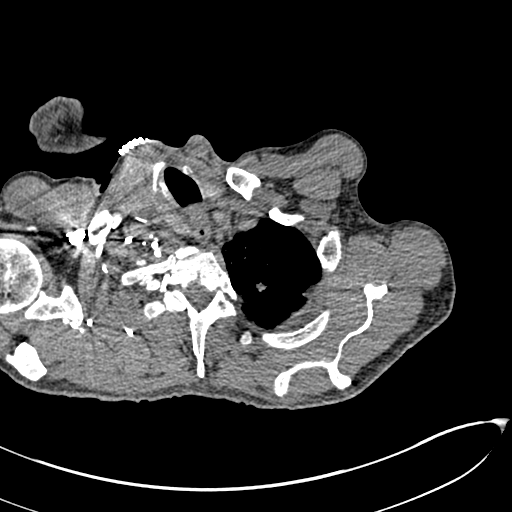
[im 304/322  lung]
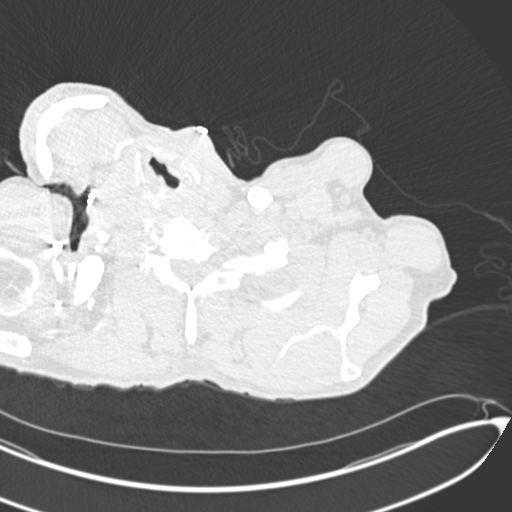

[Series 7: coronal mpr · coronal · 0.64mm/px · 1 of 161 slices shown]
[im 81/161  mediastinal]
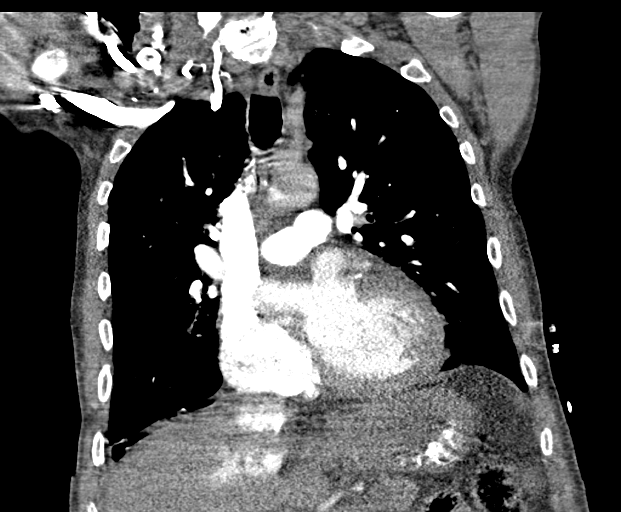

[18 of 36 positions shown; findings below may reference images not displayed]

FINDINGS: Cardiovascular: No filling defects in the pulmonary arteries to
suggest pulmonary emboli. Heart is mildly enlarged. Aorta normal
caliber.

Mediastinum/Nodes: No mediastinal, hilar, or axillary adenopathy.
Trachea and esophagus are unremarkable. Thyroid unremarkable.

Lungs/Pleura: Biapical pulmonary nodules measuring up to 10 mm in
the left apex and 9 mm in the right apex. Areas of apical and
bibasilar scarring. Trace right pleural effusion.

Upper Abdomen: Imaging into the upper abdomen demonstrates no acute
findings.

Musculoskeletal: Chest wall soft tissues are unremarkable. No acute
bony abnormality.

Review of the MIP images confirms the above findings.
IMPRESSION: No evidence of pulmonary embolus.

Cardiomegaly.

Biapical and bibasilar scarring. Nodular densities in the upper
lobes/apices bilaterally warrant follow-up. Non-contrast chest CT at
3-6 months is recommended. If the nodules are stable at time of
repeat CT, then future CT at 18-24 months (from today's scan) is
considered optional for low-risk patients, but is recommended for
high-risk patients. This recommendation follows the consensus
statement: Guidelines for Management of Incidental Pulmonary Nodules
Detected on CT Images: From the [HOSPITAL] 3836; Radiology

Trace right pleural effusion.

## 2023-03-16 ENCOUNTER — Other Ambulatory Visit: Payer: Self-pay | Admitting: Nurse Practitioner

## 2023-03-16 ENCOUNTER — Telehealth: Payer: Self-pay

## 2023-03-16 MED ORDER — DAPAGLIFLOZIN PROPANEDIOL 10 MG PO TABS: 10.0000 mg | ORAL_TABLET | Freq: Every day | ORAL | 2 refills | Status: AC

## 2023-03-19 NOTE — Telephone Encounter (Signed)
 Sent to PCP ?

## 2023-03-28 ENCOUNTER — Other Ambulatory Visit: Payer: Self-pay

## 2023-03-30 ENCOUNTER — Other Ambulatory Visit: Payer: Self-pay

## 2023-05-14 ENCOUNTER — Ambulatory Visit: Payer: Self-pay | Admitting: Nurse Practitioner

## 2023-05-26 ENCOUNTER — Other Ambulatory Visit: Payer: Self-pay | Admitting: Nurse Practitioner

## 2023-05-26 DIAGNOSIS — G629 Polyneuropathy, unspecified: Secondary | ICD-10-CM

## 2023-05-29 NOTE — Telephone Encounter (Signed)
 Please advise [facility]

## 2023-06-04 ENCOUNTER — Ambulatory Visit: Admitting: Nurse Practitioner

## 2023-07-05 ENCOUNTER — Other Ambulatory Visit: Payer: Self-pay
